# Patient Record
Sex: Female | Born: 1955
Health system: Southern US, Community
[De-identification: ages and names within clinical notes are randomized; demographics above are authoritative.]

## PROBLEM LIST (undated history)

## (undated) DIAGNOSIS — I82409 Acute embolism and thrombosis of unspecified deep veins of unspecified lower extremity: Secondary | ICD-10-CM

## (undated) DIAGNOSIS — Z9889 Other specified postprocedural states: Secondary | ICD-10-CM

## (undated) DIAGNOSIS — R519 Headache, unspecified: Secondary | ICD-10-CM

## (undated) DIAGNOSIS — K8050 Calculus of bile duct without cholangitis or cholecystitis without obstruction: Secondary | ICD-10-CM

## (undated) DIAGNOSIS — R5383 Other fatigue: Secondary | ICD-10-CM

## (undated) DIAGNOSIS — K449 Diaphragmatic hernia without obstruction or gangrene: Secondary | ICD-10-CM

## (undated) DIAGNOSIS — M1990 Unspecified osteoarthritis, unspecified site: Secondary | ICD-10-CM

## (undated) DIAGNOSIS — I803 Phlebitis and thrombophlebitis of lower extremities, unspecified: Secondary | ICD-10-CM

## (undated) DIAGNOSIS — K649 Unspecified hemorrhoids: Secondary | ICD-10-CM

## (undated) DIAGNOSIS — R112 Nausea with vomiting, unspecified: Secondary | ICD-10-CM

## (undated) DIAGNOSIS — M170 Bilateral primary osteoarthritis of knee: Secondary | ICD-10-CM

## (undated) DIAGNOSIS — R51 Headache: Secondary | ICD-10-CM

## (undated) DIAGNOSIS — A630 Anogenital (venereal) warts: Secondary | ICD-10-CM

## (undated) DIAGNOSIS — K602 Anal fissure, unspecified: Secondary | ICD-10-CM

## (undated) DIAGNOSIS — N393 Stress incontinence (female) (male): Secondary | ICD-10-CM

## (undated) DIAGNOSIS — G43909 Migraine, unspecified, not intractable, without status migrainosus: Secondary | ICD-10-CM

## (undated) DIAGNOSIS — K219 Gastro-esophageal reflux disease without esophagitis: Secondary | ICD-10-CM

## (undated) DIAGNOSIS — E669 Obesity, unspecified: Secondary | ICD-10-CM

## (undated) HISTORY — DX: Migraine, unspecified, not intractable, without status migrainosus: G43.909

## (undated) HISTORY — DX: Acute embolism and thrombosis of unspecified deep veins of unspecified lower extremity: I82.409

## (undated) HISTORY — DX: Obesity, unspecified: E66.9

## (undated) HISTORY — DX: Phlebitis and thrombophlebitis of lower extremities, unspecified: I80.3

## (undated) HISTORY — DX: Other fatigue: R53.83

## (undated) HISTORY — DX: Unspecified osteoarthritis, unspecified site: M19.90

## (undated) HISTORY — DX: Anal fissure, unspecified: K60.2

## (undated) HISTORY — DX: Headache, unspecified: R51.9

## (undated) HISTORY — DX: Stress incontinence (female) (male): N39.3

## (undated) HISTORY — DX: Gastro-esophageal reflux disease without esophagitis: K21.9

## (undated) HISTORY — DX: Calculus of bile duct without cholangitis or cholecystitis without obstruction: K80.50

## (undated) HISTORY — DX: Anogenital (venereal) warts: A63.0

## (undated) HISTORY — DX: Headache: R51

## (undated) HISTORY — DX: Unspecified hemorrhoids: K64.9

## (undated) HISTORY — DX: Wilson's disease: E83.01

## (undated) HISTORY — PX: ABDOMINAL HYSTERECTOMY: SHX81

## (undated) HISTORY — DX: Bilateral primary osteoarthritis of knee: M17.0

## (undated) HISTORY — DX: Diaphragmatic hernia without obstruction or gangrene: K44.9

---

## 1984-02-17 HISTORY — PX: ANAL FISSURECTOMY: SUR608

## 1994-06-17 DIAGNOSIS — K8050 Calculus of bile duct without cholangitis or cholecystitis without obstruction: Secondary | ICD-10-CM

## 1994-06-17 HISTORY — DX: Calculus of bile duct without cholangitis or cholecystitis without obstruction: K80.50

## 1995-02-17 HISTORY — PX: LAPAROSCOPIC CHOLECYSTECTOMY: SUR755

## 1998-07-09 ENCOUNTER — Encounter: Payer: Self-pay | Admitting: *Deleted

## 1998-07-09 ENCOUNTER — Ambulatory Visit (HOSPITAL_COMMUNITY): Admission: RE | Admit: 1998-07-09 | Discharge: 1998-07-09 | Payer: Self-pay | Admitting: *Deleted

## 1998-10-17 ENCOUNTER — Ambulatory Visit (HOSPITAL_COMMUNITY): Admission: RE | Admit: 1998-10-17 | Discharge: 1998-10-17 | Payer: Self-pay | Admitting: Family Medicine

## 1998-10-17 ENCOUNTER — Encounter: Payer: Self-pay | Admitting: Family Medicine

## 1998-10-18 ENCOUNTER — Other Ambulatory Visit: Admission: RE | Admit: 1998-10-18 | Discharge: 1998-10-18 | Payer: Self-pay | Admitting: *Deleted

## 1999-08-13 ENCOUNTER — Ambulatory Visit (HOSPITAL_COMMUNITY): Admission: RE | Admit: 1999-08-13 | Discharge: 1999-08-13 | Payer: Self-pay | Admitting: *Deleted

## 1999-08-13 ENCOUNTER — Encounter: Payer: Self-pay | Admitting: *Deleted

## 1999-12-29 ENCOUNTER — Other Ambulatory Visit: Admission: RE | Admit: 1999-12-29 | Discharge: 1999-12-29 | Payer: Self-pay | Admitting: Family Medicine

## 2001-06-30 ENCOUNTER — Other Ambulatory Visit: Admission: RE | Admit: 2001-06-30 | Discharge: 2001-06-30 | Payer: Self-pay | Admitting: *Deleted

## 2001-11-15 ENCOUNTER — Ambulatory Visit (HOSPITAL_COMMUNITY): Admission: RE | Admit: 2001-11-15 | Discharge: 2001-11-15 | Payer: Self-pay | Admitting: *Deleted

## 2001-11-15 ENCOUNTER — Encounter: Payer: Self-pay | Admitting: *Deleted

## 2002-06-15 ENCOUNTER — Encounter: Payer: Self-pay | Admitting: Family Medicine

## 2002-06-15 ENCOUNTER — Ambulatory Visit (HOSPITAL_COMMUNITY): Admission: RE | Admit: 2002-06-15 | Discharge: 2002-06-15 | Payer: Self-pay | Admitting: Family Medicine

## 2002-09-07 ENCOUNTER — Ambulatory Visit: Admission: RE | Admit: 2002-09-07 | Discharge: 2002-09-07 | Payer: Self-pay | Admitting: Family Medicine

## 2003-02-17 HISTORY — PX: BUNIONECTOMY: SHX129

## 2003-11-09 ENCOUNTER — Other Ambulatory Visit: Admission: RE | Admit: 2003-11-09 | Discharge: 2003-11-09 | Payer: Self-pay | Admitting: *Deleted

## 2003-11-16 ENCOUNTER — Ambulatory Visit (HOSPITAL_COMMUNITY): Admission: RE | Admit: 2003-11-16 | Discharge: 2003-11-16 | Payer: Self-pay | Admitting: *Deleted

## 2005-09-30 ENCOUNTER — Ambulatory Visit (HOSPITAL_COMMUNITY): Admission: RE | Admit: 2005-09-30 | Discharge: 2005-09-30 | Payer: Self-pay | Admitting: Obstetrics and Gynecology

## 2005-10-21 ENCOUNTER — Other Ambulatory Visit: Admission: RE | Admit: 2005-10-21 | Discharge: 2005-10-21 | Payer: Self-pay | Admitting: Obstetrics & Gynecology

## 2006-02-16 HISTORY — PX: ENDOVENOUS ABLATION SAPHENOUS VEIN W/ LASER: SUR449

## 2006-12-14 ENCOUNTER — Ambulatory Visit (HOSPITAL_COMMUNITY): Admission: RE | Admit: 2006-12-14 | Discharge: 2006-12-14 | Payer: Self-pay | Admitting: Family Medicine

## 2007-01-10 ENCOUNTER — Other Ambulatory Visit: Admission: RE | Admit: 2007-01-10 | Discharge: 2007-01-10 | Payer: Self-pay | Admitting: Obstetrics & Gynecology

## 2007-12-12 ENCOUNTER — Ambulatory Visit: Payer: Self-pay | Admitting: Family Medicine

## 2008-01-19 ENCOUNTER — Other Ambulatory Visit: Admission: RE | Admit: 2008-01-19 | Discharge: 2008-01-19 | Payer: Self-pay | Admitting: Obstetrics & Gynecology

## 2008-01-30 ENCOUNTER — Encounter: Admission: RE | Admit: 2008-01-30 | Discharge: 2008-01-30 | Payer: Self-pay | Admitting: Gastroenterology

## 2008-02-15 ENCOUNTER — Ambulatory Visit (HOSPITAL_COMMUNITY): Admission: RE | Admit: 2008-02-15 | Discharge: 2008-02-15 | Payer: Self-pay | Admitting: Obstetrics & Gynecology

## 2009-03-20 ENCOUNTER — Ambulatory Visit (HOSPITAL_COMMUNITY): Admission: RE | Admit: 2009-03-20 | Discharge: 2009-03-20 | Payer: Self-pay | Admitting: Obstetrics & Gynecology

## 2009-05-30 ENCOUNTER — Encounter: Admission: RE | Admit: 2009-05-30 | Discharge: 2009-05-30 | Payer: Self-pay | Admitting: General Surgery

## 2009-12-26 ENCOUNTER — Encounter
Admission: RE | Admit: 2009-12-26 | Discharge: 2009-12-26 | Payer: Self-pay | Source: Home / Self Care | Attending: General Surgery | Admitting: General Surgery

## 2010-03-12 ENCOUNTER — Ambulatory Visit (HOSPITAL_COMMUNITY): Admission: RE | Admit: 2010-03-12 | Payer: Self-pay | Source: Home / Self Care | Admitting: General Surgery

## 2010-03-19 ENCOUNTER — Encounter: Payer: Self-pay | Admitting: General Surgery

## 2010-04-09 ENCOUNTER — Other Ambulatory Visit: Payer: Self-pay | Admitting: Obstetrics & Gynecology

## 2010-04-09 DIAGNOSIS — Z1231 Encounter for screening mammogram for malignant neoplasm of breast: Secondary | ICD-10-CM

## 2010-04-15 ENCOUNTER — Ambulatory Visit (HOSPITAL_COMMUNITY)
Admission: RE | Admit: 2010-04-15 | Discharge: 2010-04-15 | Disposition: A | Discharge status: Home / Self Care | Payer: BC Managed Care – PPO | Source: Ambulatory Visit | Attending: Obstetrics & Gynecology | Admitting: Obstetrics & Gynecology

## 2010-04-15 DIAGNOSIS — Z1231 Encounter for screening mammogram for malignant neoplasm of breast: Secondary | ICD-10-CM

## 2010-06-17 ENCOUNTER — Other Ambulatory Visit (HOSPITAL_COMMUNITY): Payer: Self-pay | Admitting: General Surgery

## 2010-06-26 ENCOUNTER — Ambulatory Visit (HOSPITAL_COMMUNITY)
Admission: RE | Admit: 2010-06-26 | Discharge: 2010-06-26 | Disposition: A | Discharge status: Home / Self Care | Payer: 59 | Source: Ambulatory Visit | Attending: General Surgery | Admitting: General Surgery

## 2010-06-26 DIAGNOSIS — K219 Gastro-esophageal reflux disease without esophagitis: Secondary | ICD-10-CM | POA: Insufficient documentation

## 2010-06-26 DIAGNOSIS — R0989 Other specified symptoms and signs involving the circulatory and respiratory systems: Secondary | ICD-10-CM | POA: Insufficient documentation

## 2010-06-26 DIAGNOSIS — Z6839 Body mass index (BMI) 39.0-39.9, adult: Secondary | ICD-10-CM | POA: Insufficient documentation

## 2010-06-26 DIAGNOSIS — R0609 Other forms of dyspnea: Secondary | ICD-10-CM | POA: Insufficient documentation

## 2010-06-26 DIAGNOSIS — R030 Elevated blood-pressure reading, without diagnosis of hypertension: Secondary | ICD-10-CM | POA: Insufficient documentation

## 2010-06-26 DIAGNOSIS — K449 Diaphragmatic hernia without obstruction or gangrene: Secondary | ICD-10-CM | POA: Insufficient documentation

## 2010-06-30 ENCOUNTER — Ambulatory Visit (HOSPITAL_COMMUNITY)
Admission: RE | Admit: 2010-06-30 | Discharge: 2010-06-30 | Disposition: A | Discharge status: Home / Self Care | Payer: 59 | Source: Ambulatory Visit | Attending: General Surgery | Admitting: General Surgery

## 2010-06-30 DIAGNOSIS — Z01818 Encounter for other preprocedural examination: Secondary | ICD-10-CM | POA: Insufficient documentation

## 2010-07-10 ENCOUNTER — Ambulatory Visit (HOSPITAL_BASED_OUTPATIENT_CLINIC_OR_DEPARTMENT_OTHER): Payer: 59 | Attending: General Surgery

## 2010-07-10 DIAGNOSIS — G4710 Hypersomnia, unspecified: Secondary | ICD-10-CM | POA: Insufficient documentation

## 2010-07-10 DIAGNOSIS — G4730 Sleep apnea, unspecified: Secondary | ICD-10-CM | POA: Insufficient documentation

## 2010-07-13 DIAGNOSIS — G4730 Sleep apnea, unspecified: Secondary | ICD-10-CM

## 2010-07-13 DIAGNOSIS — R0989 Other specified symptoms and signs involving the circulatory and respiratory systems: Secondary | ICD-10-CM

## 2010-07-13 DIAGNOSIS — R0609 Other forms of dyspnea: Secondary | ICD-10-CM

## 2010-07-13 DIAGNOSIS — G4710 Hypersomnia, unspecified: Secondary | ICD-10-CM

## 2010-07-14 NOTE — Procedures (Signed)
NAME:  Kathryn Pineda, Kathryn Pineda NO.:  [id number]  MEDICAL RECORD NO.:  [id number]          PATIENT TYPE:  OUT  LOCATION:  SLEEP CENTER                 FACILITY:  [facility]  PHYSICIAN:  Clinton D. Maple Hudson, MD, FCCP, FACPDATE OF BIRTH:  Aug 27, 1957  DATE OF STUDY:  07/10/2010                           NOCTURNAL POLYSOMNOGRAM  REFERRING PHYSICIAN:  ERIC M WILSON  REFERRING PHYSICIAN:  Mary Sella. Andrey Campanile, MD  INDICATION FOR STUDY:  Hypersomnia with sleep apnea.  EPWORTH SLEEPINESS SCORE:  5/24.  BMI 39.  Weight 260 pounds, height 68.5 inches.  Neck 16 inches.  MEDICATIONS:  Home medications are charted and reviewed.  SLEEP ARCHITECTURE:  Total sleep time 271.5 minutes with sleep efficiency 63.3%.  Stage I was 11%, stage II 68.1%, stage III absent, REM 28.8% of total sleep time.  Sleep latency 89.5 minutes, REM latency 58 minutes, awake after sleep onset 70.5 minutes, arousal index 34.5.  Bedtime medication:  Tums.  RESPIRATORY DATA:  Apnea/hypopnea index (AHI) 3.1 per hour.  A total of 14 events was scored including 1 obstructive apnea, 1 mixed apnea, 12 hypopneas.  Events were not positional.  REM AHI 9.6 per hour.  An additional 77 respiratory effort-related arousals were counted contributing to a respiratory disturbance index (RDI) of 20.1 per hour. There were insufficient numbers of scored events to meet criteria for application of CPAP titration by split protocol on this study night.  OXYGEN DATA:  Moderately loud snoring with oxygen desaturation to a nadir of 83% and a mean oxygen saturation through the study of 94% on room air.  CARDIAC DATA:  Sinus rhythm with rare PAC.  MOVEMENT-PARASOMNIA:  No significant movement disturbance.  No bathroom trips.  IMPRESSIONS-RECOMMENDATIONS: 1. Sleep architecture was marked by late sleep onset after midnight,     frequent waking, nonspecific, between 1 and 3 a.m.  Consider     management for insomnia. 2. Occasional respiratory  event with sleep disturbance, within normal     limits, apnea/hypopnea index 3.1 per hour (normal range 0-5 per     hour).  Events were not positional and were more common in REM (REM     AHI 9.6 per hour).  Moderately loud snoring with oxygen     desaturation to a nadir of 83% and a mean saturation through the     study of 94% on room air.  A total of 0.2 minutes was recorded     through the study with oxygen saturation less than 88% on room air.     Clinton D. Maple Hudson, MD, [facility], FACP Diplomate, Biomedical engineer of Sleep Medicine Electronically Signed    CDY/MEDQ  D:  07/13/2010 14:49:50  T:  07/14/2010 95:18:84  Job:  [id number]

## 2010-08-28 ENCOUNTER — Encounter: Payer: 59 | Attending: General Surgery

## 2010-08-28 DIAGNOSIS — Z9884 Bariatric surgery status: Secondary | ICD-10-CM | POA: Insufficient documentation

## 2010-08-28 DIAGNOSIS — Z09 Encounter for follow-up examination after completed treatment for conditions other than malignant neoplasm: Secondary | ICD-10-CM | POA: Insufficient documentation

## 2010-08-28 DIAGNOSIS — Z713 Dietary counseling and surveillance: Secondary | ICD-10-CM | POA: Insufficient documentation

## 2010-08-28 NOTE — Progress Notes (Signed)
Pre-Operative Nutrition Class  Patient was seen on 08/28/10 for Pre-Operative Nutrition education at [facility].   Surgery date: 09/16/10 Surgery type: Lap-Band  Weight today: 271.5lbs Weight change: 8.9lb gain Total weight lost: N/A BMI: 41.3%  Samples given per MNT protocol: Bariatric Advantage Vitaband Lot #[id number] Exp: 9/13  Bariatric Advantage Calcium Citrate Lot # [id number] Exp:10/13  Celebrate Vitamins Multivitamin Lot # [id number] Exp: 9/13  Celebrate Vitamins Calcium Citrate  Lot # [id number] Exp: 4/13  The following the learning objective met by your patient during this course:   Identifies Pre-Op Dietary Goals and will begin 2 weeks pre-operatively   Identifies appropriate sources of fluids and proteins   States protein recommendations and appropriate sources pre and post-operatively  Identifies Post-Operative Dietary Goals and will follow for 2 weeks post-operatively  Identifies appropriate multivitamin and calcium sources post-operatively  Describes the need for physical activity post-operatively and will follow MD recommendations  States when to call healthcare provider regarding medication questions or post-operative complications  Follow-Up Plan: Patient will follow-up at [facility] 2 weeks post operatively for diet advancement per MD.

## 2010-09-08 ENCOUNTER — Encounter (INDEPENDENT_AMBULATORY_CARE_PROVIDER_SITE_OTHER): Payer: Self-pay | Admitting: General Surgery

## 2010-09-09 ENCOUNTER — Other Ambulatory Visit (INDEPENDENT_AMBULATORY_CARE_PROVIDER_SITE_OTHER): Payer: Self-pay | Admitting: General Surgery

## 2010-09-09 ENCOUNTER — Encounter (HOSPITAL_COMMUNITY): Payer: 59

## 2010-09-09 LAB — CBC
Hemoglobin: 13.9 g/dL (ref 12.0–15.0)
MCHC: 33.3 g/dL (ref 30.0–36.0)
MCV: 91.6 fL (ref 78.0–100.0)
RBC: 4.55 MIL/uL (ref 3.87–5.11)
WBC: 5.6 10*3/uL (ref 4.0–10.5)

## 2010-09-09 LAB — DIFFERENTIAL
Eosinophils Absolute: 0.1 10*3/uL (ref 0.0–0.7)
Eosinophils Relative: 2 % (ref 0–5)
Monocytes Relative: 7 % (ref 3–12)
Neutro Abs: 3.7 10*3/uL (ref 1.7–7.7)
Neutrophils Relative %: 65 % (ref 43–77)

## 2010-09-09 LAB — COMPREHENSIVE METABOLIC PANEL
Albumin: 4.5 g/dL (ref 3.5–5.2)
GFR calc Af Amer: >60 mL/min (ref >60)
Potassium: 4.3 mEq/L (ref 3.5–5.1)
Total Protein: 7.6 g/dL (ref 6.0–8.3)

## 2010-09-09 LAB — SURGICAL PCR SCREEN: Staphylococcus aureus: NEGATIVE

## 2010-09-10 ENCOUNTER — Encounter (INDEPENDENT_AMBULATORY_CARE_PROVIDER_SITE_OTHER): Payer: Self-pay | Admitting: General Surgery

## 2010-09-10 ENCOUNTER — Ambulatory Visit (INDEPENDENT_AMBULATORY_CARE_PROVIDER_SITE_OTHER): Payer: Commercial Managed Care - PPO | Admitting: General Surgery

## 2010-09-10 VITALS — BP 98/64 | HR 72 | Temp 96.7°F | Resp 12 | Ht 69.0 in | Wt 261.6 lb

## 2010-09-10 DIAGNOSIS — E669 Obesity, unspecified: Secondary | ICD-10-CM

## 2010-09-10 NOTE — Patient Instructions (Signed)
Continue with your pre-op LapBand diet

## 2010-09-11 NOTE — Progress Notes (Signed)
Kathryn Pineda is a 55 y.o. female.    Chief Complaint  Patient presents with  . Bariatric Pre-op    HPI HPI 55 year old obese Caucasian female who I initially saw on December 18, 2009 for consideration for weight loss surgery. She has done her preoperative evaluation and has been approved for weight loss surgery. She comes in today to discuss surgery. Her comorbidities include migraines, osteoarthritis of the lower extremity joints, and gastroesophageal reflux disease.  Past Medical History  Diagnosis Date  . Obesity   . Migraines   . Hemorrhoid   . GERD (gastroesophageal reflux disease)   . Phlebitis of leg   . Primary osteoarthritis of both knees   . SOB (shortness of breath)   . Fatigue     loss of sleep  . Breast pain   . Lump in female breast   . Hiatal hernia   . Arthritis   . Ear infection   . Sinus problem   . Rash     Past Surgical History  Procedure Date  . Anal fissurectomy 1986  . Laparoscopic cholecystectomy 1997  . Bunionectomy 2005  . Endovenous ablation saphenous vein w/ laser 2008    both lower legs  . Cesarean section 1990    Family History  Problem Relation Age of Onset  . Diabetes Father   . Osteoarthritis Mother   . Hypertension Brother     Social History History  Substance Use Topics  . Smoking status: Never Smoker   . Smokeless tobacco: Not on file  . Alcohol Use: Yes     "one drink once or twice a week"    Allergies  Allergen Reactions  . Penicillins Itching and Rash    All over body.    Current Outpatient Prescriptions  Medication Sig Dispense Refill  . CALCIUM-VITAMIN D PO Take by mouth as needed.        . Cyanocobalamin (B-12 PO) Take by mouth as needed.        Marland Kitchen guaiFENesin (MUCINEX) 600 MG 12 hr tablet Take 1,200 mg by mouth as needed.       . Loratadine (CLARITIN PO) Take by mouth as needed.        . Multiple Vitamin (MULTIVITAMIN) tablet Take 1 tablet by mouth as needed.        Marland Kitchen ibuprofen (ADVIL,MOTRIN) 800 MG tablet  Take 800 mg by mouth as needed.          Review of Systems Review of Systems  Constitutional: Negative for fever and chills.  Eyes: Negative for blurred vision.  Respiratory: Negative for shortness of breath.   Cardiovascular: Negative for chest pain, palpitations, orthopnea and PND.  Gastrointestinal: Negative for heartburn, nausea, vomiting, abdominal pain, constipation and blood in stool.  Genitourinary: Negative for dysuria and urgency.  Musculoskeletal: Positive for joint pain.  Skin: Negative for itching and rash.  Neurological: Negative for dizziness, seizures and headaches.  Endo/Heme/Allergies: Does not bruise/bleed easily.  Psychiatric/Behavioral: Negative.     Physical Exam Physical Exam  Vitals reviewed. Constitutional: She is oriented to person, place, and time. She appears well-developed and well-nourished. No distress.  HENT:  Head: Normocephalic and atraumatic.  Eyes: Conjunctivae are normal. No scleral icterus.  Neck: Normal range of motion. No tracheal deviation present.  Cardiovascular: Normal rate, regular rhythm and normal heart sounds.   Respiratory: Effort normal and breath sounds normal. No respiratory distress. She has no wheezes.  GI: Soft. Bowel sounds are normal. She exhibits no distension. There  is no tenderness. There is no guarding.       Well healed trocar incisions  Musculoskeletal: Normal range of motion.  Lymphadenopathy:    She has no cervical adenopathy.  Neurological: She is alert and oriented to person, place, and time.  Skin: Skin is warm and dry.  Psychiatric: She has a normal mood and affect. Her behavior is normal. Judgment and thought content normal.     Blood pressure 98/64, pulse 72, temperature 96.7 F (35.9 C), temperature source Temporal, resp. rate 12, height 5\' 9"  (1.753 m), weight 261 lb 9.6 oz (118.661 kg).  Data Reviewed:  UPPER GI SERIES WITH KUB  Technique: Routine upper GI series was performed with thin and  high  density barium.  Comparison: None.   Findings: The KUB reveals a normal stool and bowel gas pattern.  Surgical clips project over the gallbladder fossa. An IUD is seen  at the mid pelvis.   There is a small sliding type hiatal hernia. No reflux was seen  during the exam. Otherwise, the esophagus, stomach, duodenal bulb,  and remainder of the C-loop have a normal appearance with no  evidence of stricture, ulceration, fixed filling defect.   IMPRESSION:  There is a small sliding type hiatal hernia. The exam is otherwise  Negative.  CMET, CBC, T4, TSH are wnl from 07/08/10    Assessment/Plan 55 year old obese Caucasian female with a BMI of 38.7 who is scheduled for laparoscopic adjustable gastric band placement with possible hiatal hernia repair next week.  We reviewed her upper GI results as well as her preoperative labs.  We discussed laparoscopic adjustable gastric banding. The patient was given Agricultural engineer. We discussed the risk and benefits of surgery including but not limited to bleeding, infection, injury to surrounding structures, blood clot formation such as deep venous thrombosis or pulmonary embolism, need to convert to an open procedure, band slippage, band erosion, failure to loose weight, port complications, esophageal dilatation, worsening reflux, and vitamin deficiencies. We discussed the typical post operative recovery course. We discussed that her postoperative diet will be modified for several weeks. We specifically talked about the need to be on a liquid diet for one to 2 weeks after surgery. We also discussed the typical postoperative course with a laparoscopic adjustable gastric band and the need for frequent postoperative visits to assess the volume status of the band.  We discussed the typical expected weight loss with a laparoscopic adjustable gastric band. I explained to the patient that they can expect to lose 40-60% of their excess body weight if they are  compliant with their postoperative instructions. However I did explain that some patients loose less than 40% and some patients lose more than 60% of their excess body weight.   Gaynelle Adu M 09/11/2010, 12:54 PM

## 2010-09-16 ENCOUNTER — Ambulatory Visit (HOSPITAL_COMMUNITY): Payer: 59

## 2010-09-16 ENCOUNTER — Ambulatory Visit (HOSPITAL_COMMUNITY)
Admission: RE | Admit: 2010-09-16 | Discharge: 2010-09-16 | Disposition: A | Discharge status: Home / Self Care | Payer: 59 | Source: Ambulatory Visit | Attending: General Surgery | Admitting: General Surgery

## 2010-09-16 DIAGNOSIS — K219 Gastro-esophageal reflux disease without esophagitis: Secondary | ICD-10-CM | POA: Insufficient documentation

## 2010-09-16 DIAGNOSIS — M1990 Unspecified osteoarthritis, unspecified site: Secondary | ICD-10-CM | POA: Insufficient documentation

## 2010-09-16 DIAGNOSIS — K210 Gastro-esophageal reflux disease with esophagitis: Secondary | ICD-10-CM

## 2010-09-16 DIAGNOSIS — Z9089 Acquired absence of other organs: Secondary | ICD-10-CM | POA: Insufficient documentation

## 2010-09-16 DIAGNOSIS — K449 Diaphragmatic hernia without obstruction or gangrene: Secondary | ICD-10-CM

## 2010-09-16 DIAGNOSIS — Z6838 Body mass index (BMI) 38.0-38.9, adult: Secondary | ICD-10-CM | POA: Insufficient documentation

## 2010-09-16 DIAGNOSIS — G43909 Migraine, unspecified, not intractable, without status migrainosus: Secondary | ICD-10-CM | POA: Insufficient documentation

## 2010-09-16 DIAGNOSIS — Z01812 Encounter for preprocedural laboratory examination: Secondary | ICD-10-CM | POA: Insufficient documentation

## 2010-09-16 HISTORY — PX: LAPAROSCOPIC GASTRIC SLEEVE RESECTION WITH HIATAL HERNIA REPAIR: SHX6512

## 2010-09-23 ENCOUNTER — Encounter: Payer: 59 | Attending: General Surgery

## 2010-09-23 ENCOUNTER — Ambulatory Visit: Payer: 59 | Admitting: *Deleted

## 2010-09-23 DIAGNOSIS — Z09 Encounter for follow-up examination after completed treatment for conditions other than malignant neoplasm: Secondary | ICD-10-CM | POA: Insufficient documentation

## 2010-09-23 DIAGNOSIS — Z713 Dietary counseling and surveillance: Secondary | ICD-10-CM | POA: Insufficient documentation

## 2010-09-23 DIAGNOSIS — Z9884 Bariatric surgery status: Secondary | ICD-10-CM | POA: Insufficient documentation

## 2010-09-23 NOTE — Progress Notes (Signed)
  2 Week Post-Operative Nutrition Class  Patient was seen on 09/23/2010 for Post-Operative Nutrition education at the [facility].   Surgery date: 09/16/10 Surgery type: LAGB  Weight today: 249.6lbs  Weight change: 21.9 lbs BMI: 37.9%  The following the learning objective met the patient during this course:   Identifies Phase 3A (Soft, High Proteins) Dietary Goals and will begin from 2 weeks post-operatively to 2 months post-operatively   Identifies appropriate sources of fluids and proteins   States protein recommendations and appropriate sources post-operatively  Identifies the need for appropriate texture modifications, mastication, and bite sizes when consuming solids  Identifies appropriate multivitamin and calcium sources post-operatively  Describes the need for physical activity post-operatively and will follow MD recommendations  States when to call healthcare provider regarding medication questions or post-operative complications  Follow-Up Plan: Patient will follow-up at [facility] in 6 weeks for 2 months post-op nutrition visit for diet advancement per MD.

## 2010-09-23 NOTE — Op Note (Signed)
NAMECHELSIE, Kathryn Pineda NO.:  [id number]  MEDICAL RECORD NO.:  [id number]  LOCATION:  DAY[facility]                         FACILITY:  [facility]  PHYSICIAN:  Mary Sella. Andrey Campanile, MD     DATE OF BIRTH:  [birth year]  DATE OF PROCEDURE:  09/16/2010 DATE OF DISCHARGE:                              OPERATIVE REPORT   PREOPERATIVE DIAGNOSES: 1. Obesity (body mass index of 38.7). 2. Gastroesophageal reflux disease. 3. Osteoarthritis.  POSTOPERATIVE DIAGNOSES: 1. Obesity (body mass index of 38.7). 2. Gastroesophageal reflux disease. 3. Osteoarthritis.  PROCEDURES: 1. Primary hiatal hernia repair. 2. Laparoscopic adjustable gastric band placement.  SURGEON:  Mary Sella. Andrey Campanile, M.D.  ASSISTANT SURGEON:  Sandria Bales. Ezzard Standing, M.D.  ANESTHESIA:  General plus local consisting of 0.25% Marcaine with epi.  FINDINGS:  The patient had an obvious hiatal hernia upon looking at her diaphragm.  Once we identified the left and right crus, I repaired the crural defect with 2 interrupted 0 Ethibond sutures using an Endostitch.  An AP Standard band was placed in the pars flaccida technique.  ESTIMATED BLOOD LOSS:  Minimal.  COMPLICATIONS:  None immediately apparent.  INDICATIONS FOR PROCEDURE:  Patient is a 55 year old obese Caucasian female who has been unsuccessful for long-term successful weight loss. Her comorbidities include gastroesophageal reflux disease, migraines, and osteoarthritis.  We discussed the risks and benefits of laparoscopic adjustable gastric banding with possible hiatal hernia repair at length including but not limited to bleeding, infection, injury to surrounding structures, band erosion, band slippage, DVT and pulmonary embolism formation, wound complications, port complications, failure to lose weight, worsening gastroesophageal reflux disease, esophageal dilatation, and recurrence of hiatal hernia.  We also talked about the typical postoperative recovery course.  The  patient elected proceed with surgery.  DESCRIPTION OF PROCEDURE:  The patient received subcutaneous heparin within 2 hours of surgery.  She was then taken to the operating room and placed supine on the operating table.  General endotracheal anesthesia was established.  Sequential compression devices were placed.  She received IV Tylenol as well as antibiotics prior to skin incision. Surgical time-out was performed.  I gained entry into her abdomen in the left upper quadrant 2 fingerbreadths below the left subcostal margin by 1-cm incision with #11 blade.  Then, using a 0-degree 5-mm camera through a 5-mm trocar, I advanced the trocar under direct visualization through all layers of the abdominal wall and I carefully entered her abdominal cavity. Pneumoperitoneum was smoothly established up to a patient pressure of 15 mmHg.  There was no scar tissue from her prior laparoscopic cholecystectomy.  I then placed additional trocars.  I placed an 11 mm trocar at the level of the umbilicus slightly to the left.  I placed a 15-mm trocar in the right upper quadrant 2 fingerbreadths below the right subcostal margin and I placed an 11-mm trocar in the right mid upper abdomen, all under direct visualization after local been infiltrated.  The patient was placed in steep reverse Trendelenburg.  I made a small incision underneath the xiphoid and placed a 5-mm trocar under direct visualization.  The trocar were removed and I placed Nathanson liver retractor to lift up the  left lobe of liver and was secured to Iron Intern.  There was obvious diaphragmatic defect when looking at the hiatus.  I first turned my attention to incising the gastrohepatic ligament with hook electrocautery.  The right crus was identified.  Then, using a Kitner as well as a blunt grasper, I identified the left crus.  I initially dissected a little bit on the outside of left crus getting into a little bit of bleeding.   The bleeding was controlled with several Ligaclips.  There was excellent hemostasis.  I identified the posterior vagus.  The left and right crus was identified.  I then asked CRNA to pass the calibration tubing down the esophagus into the stomach.  It was insufflated with 10 cc of air, it was then easily withdrawn back into the esophagus confirming the defect.  The balloon was let down, withdrawn back into the esophagus.  I placed 2 interrupted 0 Ethibond sutures between the left and right crus, each secured with a titanium tie knot.  We then passed the calibration tubing back down the stomach, insufflated with 10 cc.  The calibration tubing was then gently pulled back and it had adequate resistance at the GE junction.  The balloon was deflated & the calibration tubing was pulled back into the esophagus.  I then incised the angle of His with hook electrocautery.  Then using the band dissector, I gently dissected down the left crus at the angle of His.  I then passed the band passer retrogastric in the pars flaccida technique toward the angle of His.  I determined that we would need an AP Standard band, band was prepared according to package traction and then inserted through the right upper quadrant 15-mm trocar.  The end of the band was threaded through the passer and then brought back behind the stomach. The calibration tubing was then advanced back into the stomach and the band was secured to the buckle with the black tab locking into position. The calibration tubing was removed.  I then placed 3 gastrogastric imbrication sutures with interrupted 2-0 Ethibond sutures.  The port tubing was brought up through the right mid upper abdomen 11 mm trocar site.  The [facility] liver retractor was removed under direct visualization.  There was no evidence of injury to the left lobe of liver or the abdominal wall.  Pneumoperitoneum was released, remaining trocars were removed.  The skin incision  for the 11-mm trocar in the right midabdomen was enlarged and I created a small pocket underneath the skin lateral to the incision.  The end of the band tubing was cut off and the band tubing was connected to the port.  1 inch piece of Vicryl mesh had been sutured to the back of the port in 4 areas.  The band tubing and the port were connected.  The redundant port tubing was advanced back into the abdominal cavity and the port was placed in the subcutaneous tissue in the right midabdomen.  I then closed that wound in 2 layers, initially with interrupted 2-0 Vicryl and the skin incision with a 4-0 Monocryl.  Additional Monocryl was placed in the skin incisions.  Remaining skin incisions were closed with 4-0 Monocryl followed by application of Dermabond.  All needle, instrument, and sponge counts were correct x2.  There were no immediate complications.  The patient tolerated the procedure well.  She was extubated and taken to the recovery room.     Mary Sella. Andrey Campanile, MD  EMW/MEDQ  D:  09/16/2010  T:  09/16/2010  Job:  [id number]  cc:   M. Leda Quail, MD Fax: [phone]  Electronically Signed by Gaynelle Adu M.D. on 09/23/2010 10:46:20 AM

## 2010-10-08 ENCOUNTER — Ambulatory Visit (INDEPENDENT_AMBULATORY_CARE_PROVIDER_SITE_OTHER): Payer: Commercial Managed Care - PPO | Admitting: General Surgery

## 2010-10-08 ENCOUNTER — Encounter (INDEPENDENT_AMBULATORY_CARE_PROVIDER_SITE_OTHER): Payer: Self-pay | Admitting: General Surgery

## 2010-10-08 VITALS — BP 110/70 | Ht 69.0 in | Wt 244.4 lb

## 2010-10-08 DIAGNOSIS — Z9884 Bariatric surgery status: Secondary | ICD-10-CM

## 2010-10-08 DIAGNOSIS — E669 Obesity, unspecified: Secondary | ICD-10-CM

## 2010-10-08 DIAGNOSIS — Z09 Encounter for follow-up examination after completed treatment for conditions other than malignant neoplasm: Secondary | ICD-10-CM

## 2010-10-08 NOTE — Patient Instructions (Signed)
Continue taking supplements. Exercise for at least 4 days a week

## 2010-10-08 NOTE — Progress Notes (Signed)
Procedure: Status post laparoscopic adjustable gastric band placement with hiatal hernia repair on September 16, 2010  History of Present Ilness: 55 year old obese Caucasian female comes in today for her first postoperative appointment. She has returned to work. She's had some problems with constipation since surgery. She had some initial constipation within the first several days but then it resolved. She recently had another episode of constipation over the weekend. This caused her hemorrhoids to flareup. She took a teaspoon of Metamucil with some water which helped her to have a bowel movement. She also took some milk of magnesia for the episode immediately after surgery.  She denies any fevers, chills, nausea, or vomiting. She denies any regurgitation. She still has some soreness in her right mid abdomen. She has seen the nutritionist. She has been advanced to solid food. She has had no problems eating minced chicken. He  Physical Exam: BP 110/70  Ht 5\' 9"  (1.753 m)  Wt 244 lb 6 oz (110.848 kg)  BMI 36.09 kg/m2  Is well-developed well-nourished obese Caucasian female no apparent distress Pulmonary-lungs are clear to auscultation Cardiac-regular rate and rhythm Abdomen-soft, nontender, nondistended. Well-healed trocar incisions. Port is in the right upper quadrant. There is some bruising around the port site. No hernia. No cellulitis.  Data reviewed: I reviewed the nutritionist note from August 7.  Assessment and Plan: Status post laparoscopic adjustable gastric band placement with hiatal hernia repair-doing well. She has lost 16.4 pounds since surgery.   I congratulated her on her weight loss. I encouraged her to try to exercise at least 4 days a week. I encouraged her to exercise for at least 30 minutes each time. I also encouraged her to take a small teaspoon of Metamucil on a daily basis to help keep her regular. I advised her that the bruising will resolve. She was instructed to continue  with her supplement intake.  I'll see her in 3 weeks in order to do her first adjustment.

## 2010-10-30 ENCOUNTER — Ambulatory Visit (INDEPENDENT_AMBULATORY_CARE_PROVIDER_SITE_OTHER): Payer: Commercial Managed Care - PPO | Admitting: General Surgery

## 2010-10-30 ENCOUNTER — Encounter (INDEPENDENT_AMBULATORY_CARE_PROVIDER_SITE_OTHER): Payer: Self-pay | Admitting: General Surgery

## 2010-10-30 VITALS — BP 120/80 | Wt 244.4 lb

## 2010-10-30 DIAGNOSIS — Z09 Encounter for follow-up examination after completed treatment for conditions other than malignant neoplasm: Secondary | ICD-10-CM

## 2010-10-30 DIAGNOSIS — Z9884 Bariatric surgery status: Secondary | ICD-10-CM

## 2010-10-30 MED ORDER — LIDOCAINE HCL 2 % EX GEL: CUTANEOUS | Status: DC

## 2010-10-30 NOTE — Progress Notes (Signed)
Procedure: Status post laparoscopic adjustable gastric band placement with hiatal hernia repair on September 16, 2010  History of Present Ilness: 55 year old obese Caucasian female comes in today for her second postoperative appointment. She has returned to work. She's had some problems with constipation since surgery. Her constipation has improved since last visit.  She has noticed an improvement when takes some metamucil.   She denies any fevers, chills, nausea, or vomiting. She denies any regurgitation. She has had one episode of where some chicken got stuck. It eventually went down. She thinks that she did not chew it thoroughly enough. She states that she really hasn't exercised that much. She does not exercise on the days that she works. On her off days she will walk.  She says her reflux has resolved since surgery.  Physical Exam: BP 120/80  Wt 244 lb 6 oz (110.848 kg)   Is well-developed well-nourished obese Caucasian female no apparent distress Pulmonary-lungs are clear to auscultation Cardiac-regular rate and rhythm Abdomen-soft, nontender, nondistended. Well-healed trocar incisions. Port is in the right upper quadrant.  No hernia. No cellulitis.  Data reviewed: I reviewed the nutritionist note from August 7 and my last office note  Assessment and Plan: Status post laparoscopic adjustable gastric band placement with hiatal hernia repair-doing well. She has lost 16.4 pounds since surgery. She has not lost any additional weight since her last visit.   I encouraged her to try to exercise at least 4 days a week. I encouraged her to exercise for at least 30 minutes each time. I also encouraged to continue with Metamucil on a daily basis to help keep her regular.  We did her first adjustment today. Her abdominal wall was prepped with ChloraPrep. I then aspirated her port with a 22-gauge Huber needle and injected 1 cc of saline. The patient tolerated the procedure well. She was able to drink  sips of water without problems.  She was instructed to do a liquid diet for the next 48 hours. She was given our band fill adjustment instruction sheet  I will give her a prescription for topical lidocaine to apply to her abdominal wall prior to her office visits for future band adjustments.  We will refer her to the [facility] program.

## 2010-10-30 NOTE — Patient Instructions (Signed)

## 2010-11-03 ENCOUNTER — Encounter: Payer: Self-pay | Admitting: *Deleted

## 2010-11-03 ENCOUNTER — Encounter: Payer: 59 | Attending: General Surgery | Admitting: *Deleted

## 2010-11-03 DIAGNOSIS — Z9884 Bariatric surgery status: Secondary | ICD-10-CM | POA: Insufficient documentation

## 2010-11-03 DIAGNOSIS — Z713 Dietary counseling and surveillance: Secondary | ICD-10-CM | POA: Insufficient documentation

## 2010-11-03 DIAGNOSIS — Z09 Encounter for follow-up examination after completed treatment for conditions other than malignant neoplasm: Secondary | ICD-10-CM | POA: Insufficient documentation

## 2010-11-03 NOTE — Progress Notes (Signed)
  Follow-up visit: 8 Weeks Post-Operative LAGB Surgery  Medical Nutrition Therapy:  Appt start time: 1040 end time:  1100.  Assessment:  Primary concerns today: post-operative LAGB dietary intake.  Weight today: 240.7 lbs Weight change: 8.9 lb Total weight lost: 30.8 total BMI: 36.7% Weight goal: 175 lbs  24-hr recall:  B (7-9 AM): Scrambled egg OR Premier protein shake OR 1/2 cup oatmeal  L (12-1 PM): Premier Protein Shake (30g protein) OR Meat w/ vegetable (3-5 oz total) Snk (5 PM):  Premier Protein Shake (30g protein)  D (7-9 PM): Meat w/ vegetable (3-5 oz to 2 cups of food) Snk (10 PM): Sugar-free pudding or Yogurt cup  Fluid intake: water, diet green tea, Premier Protein Shake Estimated total protein intake: 75g +  Medications: No changes Supplementation: Taking MVI, Calcium Citrate (1000mg )  Using straws: No Drinking while eating: No Hair loss: None reported Carbonated beverages: No N/V/D/C: Only one episode of "food getting stuck" Last Lap-Band fill: Last fill was 10/30/10 per Dr. Andrey Campanile  Recent physical activity:  Limited activity at this point due to scheduling difficulties. Trying to walk on her days off. Plans to start back to the [facility].  Progress Towards Goal(s):  In progress.  Handouts given during visit include:  Phase 3B: High Protein with Non-Starchy Vegetable Diet   Nutritional Diagnosis:  [place]-3.3 Overweight/obesity As related to recent LAGB surgery.  As evidenced by pt following LAGB dietary guidelines for continued weight loss.    Intervention:  Nutrition education.  Monitoring/Evaluation:  Dietary intake, exercise, lap band fills, and body weight. Follow up in 6 months for 3-4 month post-op visit.

## 2010-11-03 NOTE — Patient Instructions (Signed)
Goals:  Follow Phase 3B: High Protein + Non-Starchy Vegetables  Eat 3-6 small meals/snacks, every 3-5 hrs  Increase lean protein foods to meet 60-90g goal  Increase fluid intake to 64oz +  Avoid drinking 15 minutes before, during and 30 minutes after eating  Aim for >30 min of physical activity daily 

## 2010-11-26 ENCOUNTER — Ambulatory Visit (INDEPENDENT_AMBULATORY_CARE_PROVIDER_SITE_OTHER): Payer: 59 | Admitting: General Surgery

## 2010-11-26 ENCOUNTER — Encounter (INDEPENDENT_AMBULATORY_CARE_PROVIDER_SITE_OTHER): Payer: Self-pay | Admitting: General Surgery

## 2010-11-26 VITALS — BP 108/84 | HR 60 | Temp 97.9°F | Resp 20 | Ht 68.5 in | Wt 243.0 lb

## 2010-11-26 DIAGNOSIS — Z9884 Bariatric surgery status: Secondary | ICD-10-CM

## 2010-11-26 NOTE — Progress Notes (Signed)
Procedure: Status post laparoscopic adjustable gastric band placement with hiatal hernia repair on September 16, 2010  History of Present Ilness: 55 year old obese Caucasian female comes in today for her third postoperative appointment. She has returned to work.  Her constipation has improved since last visit.  She has noticed an improvement when takes some metamucil.   She denies any fevers, chills, nausea, or vomiting. She denies any regurgitation.  She states that she really hasn't exercised that much. She does not exercise on the days that she works. On her off days she will walk. She went to the BELT program & is enrolled in the control arm of the study which means she will be able to participate in January.  She states she actually lost 4 pounds since she was last seen but she went to a wedding & made bad food choices. She has no restriction.  She says her reflux has resolved since surgery.  Physical Exam: BP 108/84  Pulse 60  Temp 97.9 F (36.6 C)  Resp 20  Ht 5' 8.5" (1.74 m)  Wt 243 lb (110.224 kg)  BMI 36.41 kg/m2   Is well-developed well-nourished obese Caucasian female no apparent distress Pulmonary-lungs are clear to auscultation Cardiac-regular rate and rhythm Abdomen-soft, nontender, nondistended. Well-healed trocar incisions. Port is in the right upper quadrant.  No hernia. No cellulitis.  Data reviewed: I reviewed my last office note  Assessment and Plan: Status post laparoscopic adjustable gastric band placement with hiatal hernia repair-doing well. She has lost 17.4 pounds since surgery. She has lost 1 pound since her last visit.   I encouraged her to try to exercise at least 4 days a week. I encouraged her to exercise for at least 30 minutes each time. I also encouraged to continue with Metamucil on a daily basis to help keep her regular.  We did her second adjustment today. Her abdominal wall was prepped with ChloraPrep. I then aspirated her port with a 22-gauge Huber  needle and injected 0.5 cc of saline. The patient tolerated the procedure well. She was able to drink sips of water without problems.  She was instructed to do a liquid diet for the next 48 hours. She was given our band fill adjustment instruction sheet  F/u 4 weeks

## 2010-11-26 NOTE — Patient Instructions (Signed)

## 2010-12-03 ENCOUNTER — Encounter (INDEPENDENT_AMBULATORY_CARE_PROVIDER_SITE_OTHER): Payer: Commercial Managed Care - PPO | Admitting: General Surgery

## 2010-12-15 ENCOUNTER — Ambulatory Visit: Payer: Self-pay | Admitting: *Deleted

## 2010-12-16 ENCOUNTER — Encounter: Payer: 59 | Attending: General Surgery | Admitting: *Deleted

## 2010-12-16 ENCOUNTER — Encounter: Payer: Self-pay | Admitting: *Deleted

## 2010-12-16 DIAGNOSIS — Z9884 Bariatric surgery status: Secondary | ICD-10-CM | POA: Insufficient documentation

## 2010-12-16 DIAGNOSIS — Z713 Dietary counseling and surveillance: Secondary | ICD-10-CM | POA: Insufficient documentation

## 2010-12-16 DIAGNOSIS — Z09 Encounter for follow-up examination after completed treatment for conditions other than malignant neoplasm: Secondary | ICD-10-CM | POA: Insufficient documentation

## 2010-12-16 NOTE — Patient Instructions (Addendum)
Goals:  Follow Phase 3B: High Protein + Non-Starchy Vegetables  OR Pre-Op Diet Meal Plan  Eat 3-6 small meals/snacks, every 3-5 hrs  Increase lean protein foods to meet 60-80g goal  Increase fluid intake to 64oz +  Avoid concentrated sweets  Limit bread, pasta, and rice portion sizes  Avoid drinking 15 minutes before, during and 30 minutes after eating  Aim for >30 min of physical activity daily

## 2010-12-16 NOTE — Progress Notes (Signed)
  Follow-up visit: 12 Weeks Post-Operative LAGB Surgery  Medical Nutrition Therapy:  Appt start time: 0835 end time:  0910   Assessment:  Primary concerns today: post-operative bariatric surgery nutrition management. Kathryn Pineda reports that over the past months she has not been following her LAGB guidelines. She notes large portions, increased amounts of sweets and carbohydrate rich foods, limited protein/fluid intake, and inadequate physical activity levels. She attributes this to increased responsibilities at home and busy schedule. [name] notes that she may need another band fill.  Weight today: 238.2 lbs Weight change: 2.5 lbs Total weight lost: 33.3 lbs BMI: 36.3% Weight goal: 175 lbs Surgery date: 09/16/10 Start weight at [facility]: 262.6 lbs  24-hr recall:  B (AM): Premier Protein Shake Snk (AM): N/A L (PM): Meat w/ vegetables Snk (PM): Halloween Candy OR SF pudding  D (PM): Tomato soup w/ two grilled cheese sandwiches Snk (PM): Cake OR Candy  Fluid intake: 20-30 oz Estimated total protein intake: 40-50 grams (Pt stopped tracking her intake over the past month)  Medications: No changes reported Supplementation: Taking supplements 50% of the time  Using straws: No Drinking while eating: No Hair loss: None reported Carbonated beverages: None N/V/D/C: Regurgitation with dry meats Last Lap-Band fill: 0.5cc fill per Dr. Andrey Campanile; 2 fills at this point  Recent physical activity:  No physical activity reported  Progress Towards Goal(s):  Some progress.  Handouts given during visit include:  Pre-Op Diet  Protein Bar Guidelines (<15g carb, 10g protein +)   Nutritional Diagnosis:  [place]-3.3 Overweight/obesity As related to recent LAGB surgery.  As evidenced by pt following LAGB dietary guidelines for continued weight loss.    Intervention:  Nutrition education.  Monitoring/Evaluation:  Dietary intake, exercise, lap band fills, and body weight. Follow up in 3 months for 6 month post-op  visit.

## 2010-12-24 ENCOUNTER — Encounter (INDEPENDENT_AMBULATORY_CARE_PROVIDER_SITE_OTHER): Payer: Self-pay | Admitting: General Surgery

## 2010-12-24 ENCOUNTER — Ambulatory Visit (INDEPENDENT_AMBULATORY_CARE_PROVIDER_SITE_OTHER): Payer: Commercial Managed Care - PPO | Admitting: General Surgery

## 2010-12-24 DIAGNOSIS — Z4651 Encounter for fitting and adjustment of gastric lap band: Secondary | ICD-10-CM

## 2010-12-24 DIAGNOSIS — E669 Obesity, unspecified: Secondary | ICD-10-CM

## 2010-12-24 DIAGNOSIS — Z9884 Bariatric surgery status: Secondary | ICD-10-CM

## 2010-12-24 NOTE — Progress Notes (Signed)
Procedure: Status post laparoscopic adjustable gastric band placement with hiatal hernia repair on September 16, 2010  History of Present Ilness: 55 year old obese Caucasian female comes in today for her fourth postoperative appointment.  She states she is doing well. She had a normal colonoscopy last week.  She attributes that to helping her loose weight.  Since her last adjustment, she is eating a little less.   She denies any fevers, chills, nausea, or vomiting. She denies any regurgitation.  She states that she still hasn't exercised that much. She does not exercise on the days that she works. On her off days she will walk.    Physical Exam: BP 124/72  Pulse 80  Resp 16  Ht 5\' 9"  (1.753 m)  Wt 238 lb 6 oz (108.126 kg)  BMI 35.20 kg/m2   Is well-developed well-nourished obese Caucasian female no apparent distress Pulmonary-lungs are clear to auscultation Cardiac-regular rate and rhythm Abdomen-soft, nontender, nondistended. Well-healed trocar incisions. Port is in the right upper quadrant.  No hernia. No cellulitis.  Data reviewed: I reviewed my last office note  Assessment and Plan: Status post laparoscopic adjustable gastric band placement with hiatal hernia repair-doing well. She has lost 21.8 pounds since surgery. She has lost 4.4 pounds since her last visit.   I encouraged her to try to exercise at least 4 days a week. I encouraged her to exercise for at least 30 minutes each time. I also encouraged to continue with Metamucil on a daily basis to help keep her regular.  We did her second adjustment today. Her abdominal wall was prepped with ChloraPrep. I then aspirated her port with a 22-gauge Huber needle and injected 1 cc of saline. The patient tolerated the procedure well. She was able to drink sips of water without problems.  She was instructed to do a liquid diet for the next 48 hours. She was given our band fill adjustment instruction sheet  F/u 4 weeks

## 2010-12-24 NOTE — Patient Instructions (Signed)

## 2010-12-31 ENCOUNTER — Encounter (INDEPENDENT_AMBULATORY_CARE_PROVIDER_SITE_OTHER): Payer: Commercial Managed Care - PPO | Admitting: General Surgery

## 2011-01-22 ENCOUNTER — Encounter (INDEPENDENT_AMBULATORY_CARE_PROVIDER_SITE_OTHER): Payer: Self-pay | Admitting: General Surgery

## 2011-01-22 ENCOUNTER — Ambulatory Visit (INDEPENDENT_AMBULATORY_CARE_PROVIDER_SITE_OTHER): Payer: Commercial Managed Care - PPO | Admitting: General Surgery

## 2011-01-22 DIAGNOSIS — E669 Obesity, unspecified: Secondary | ICD-10-CM

## 2011-01-22 DIAGNOSIS — Z9884 Bariatric surgery status: Secondary | ICD-10-CM

## 2011-01-22 NOTE — Progress Notes (Signed)
Procedure: Status post laparoscopic adjustable gastric band placement with hiatal hernia repair on September 16, 2010  History of Present Ilness: 55 year old obese Caucasian female comes in today for her fifth postoperative appointment. I last saw her on Nov 7.  She states she is doing well.  She states she can definitely tell a difference since her last adjustment.  She now has restriction.  She is now eating smaller portions.  It is taking her longer to eat.  She had problems with 'dry' food.  She now starts her meals with soft foods.  Right after her last adjustment, she had a period of about 1 week when she had a lot of nausea with po intake.  She did not regurgitate. She denies reflux or morning cough. She had a skin reaction to the bandage we placed on her abdominal wall after her last adjustment  She denies any fevers, chills, nausea, or vomiting. She denies any regurgitation.  She states that she still hasn't exercised that much. She does not exercise on the days that she works. On her off days she will walk.  She is starting the BELT program next month.  Physical Exam: BP 116/86  Pulse 60  Temp(Src) 97.6 F (36.4 C) (Temporal)  Resp 16  Ht 5\' 9"  (1.753 m)  Wt 236 lb 6 oz (107.219 kg)  BMI 34.91 kg/m2    2 pound wt loss since last visit 4 weeks ago  BP 116/86  Pulse 60  Temp(Src) 97.6 F (36.4 C) (Temporal)  Resp 16  Ht 5\' 9"  (1.753 m)  Wt 236 lb 6 oz (107.219 kg)  BMI 34.91 kg/m2  See LapBand flowsheet  Gen: alert, NAD, non-toxic appearing HEENT: normocephalic, atraumatic; pupils equal, no scleral icterus, neck supple, no lymphadenopathy Pulm: Lungs clear to auscultation, symmetric chest rise CV: regular rate and rhythm Abd: soft, nontender, nondistended. Well-healed trocar sites. No incisional hernia. Port is in right mid-abdomen. Healing areas around port site from adhesive reaction to bandage-no open wounds. Ext: no edema, normal, symmetric strength Neuro: nonfocal,  sensation grossly intact Psych: appropriate, judgment normal  Data reviewed: I reviewed my last office note  Assessment and Plan: Status post laparoscopic adjustable gastric band placement with hiatal hernia repair-doing well. She has lost 24.4 pounds since surgery. She has lost 2 pounds since her last visit.   I encouraged her to try to exercise at least 4 days a week. I encouraged her to exercise for at least 30 minutes each time. I also encouraged to continue with Metamucil on a daily basis to help keep her regular.  No adjustment today.   F/u 4 weeks  Mary Sella. Andrey Campanile, MD, FACS General, Bariatric, & Minimally Invasive Surgery [facility], [place]

## 2011-01-22 NOTE — Patient Instructions (Signed)
..   Decreasing your carbohydrate intake will hasten you weight loss.  Rely more on proteins for your meals.  Avoid condiments that contain sweets such as Honey Mustard and sugary salad dressings.   . Stay in the "green zone".  If you are regurgitating with meals, having night time reflux, and find yourself eating soft comfort foods (mashed potatoes, potato chips)...realize that you are developing "maladaptive eating".  You will not lose weight this way and may regain weight.  The GREEN ZONE is eating smaller portions and not regurgitating.  Hence we may need to withdraw fluid from your band. . Build exercise into your daily routine.  Walking is the best way to start but do something every day if you can.

## 2011-02-23 ENCOUNTER — Encounter (INDEPENDENT_AMBULATORY_CARE_PROVIDER_SITE_OTHER): Payer: Self-pay | Admitting: General Surgery

## 2011-02-23 ENCOUNTER — Ambulatory Visit (INDEPENDENT_AMBULATORY_CARE_PROVIDER_SITE_OTHER): Payer: Commercial Managed Care - PPO | Admitting: General Surgery

## 2011-02-23 VITALS — BP 116/78 | HR 70 | Temp 97.8°F | Resp 16 | Ht 69.0 in | Wt 239.4 lb

## 2011-02-23 DIAGNOSIS — Z4651 Encounter for fitting and adjustment of gastric lap band: Secondary | ICD-10-CM

## 2011-02-23 DIAGNOSIS — K602 Anal fissure, unspecified: Secondary | ICD-10-CM

## 2011-02-23 MED ORDER — AMBULATORY NON FORMULARY MEDICATION: 1.0000 "application " | Freq: Two times a day (BID) | Status: DC

## 2011-02-23 NOTE — Patient Instructions (Signed)
1. Stay on liquids for the next 2 days as you adapt to your new fill volume.  Then resume your previous diet. 2. Decreasing your carbohydrate intake will hasten you weight loss.  Rely more on proteins for your meals.  Avoid condiments that contain sweets such as Honey Mustard and sugary salad dressings.   3. Stay in the "green zone".  If you are regurgitating with meals, having night time reflux, and find yourself eating soft comfort foods (mashed potatoes, potato chips)...realize that you are developing "maladaptive eating".  You will not lose weight this way and may regain weight.  The GREEN ZONE is eating smaller portions and not regurgitating.  Hence we may need to withdraw fluid from your band. 4. Build exercise into your daily routine.  Walking is the best way to start but do something every day if you can.     If you vomit/regurgitate - stay on liquids for 24 hrs

## 2011-02-23 NOTE — Progress Notes (Signed)
Procedure: Status post laparoscopic adjustable gastric band placement with hiatal hernia repair on September 16, 2010  History of Present Ilness: 56 year old obese Caucasian female comes in today for her sixth postoperative appointment. I last saw her on Dec 6.  She states she is doing well.  She started to exercise with the BELT program this week. She feels as though she's not as restricted as she was last month. However she did have 2 episodes of regurgitation last week. It occurred during the midday after eating some rice. She also had some episode of where she regurgitated thick mucus. She's noticed that it occurs since restarting Claritin. She denies any reflux, wheezing, morning cough. She also complains of pain with defecation. She states that she's had a problem with anal fissures in the past. She says the pain only lasts for a few minutes. She denies any bleeding with bowel movements. She is having several bowel movements a day. She is only drinking one to 2 glasses of water per day. She did not do any exercise since her last visit in December except for what she has done today with the belt program.    Physical Exam: BP 116/78  Pulse 70  Temp(Src) 97.8 F (36.6 C) (Temporal)  Resp 16  Ht 5\' 9"  (1.753 m)  Wt 239 lb 6.4 oz (108.591 kg)  BMI 35.35 kg/m2   Wt gain since last visit: 2.8 pounds See LapBand flowsheet  Gen: alert, NAD, non-toxic appearing HEENT: normocephalic, atraumatic; pupils equal, no scleral icterus, neck supple, no lymphadenopathy Pulm: Lungs clear to auscultation, symmetric chest rise CV: regular rate and rhythm Abd: soft, nontender, nondistended. Well-healed trocar sites. No incisional hernia. Port is in right mid-abdomen. Healing areas around port site from adhesive reaction to bandage-no open wounds. Ext: no edema, normal, symmetric strength Neuro: nonfocal, sensation grossly intact Psych: appropriate, judgment normal Rectal- small anal fissure  Data  reviewed: I reviewed my last office note  Assessment and Plan: Status post laparoscopic adjustable gastric band placement with hiatal hernia repair-doing well. She has lost 21.6 pounds since surgery. She has gained 2.8 pounds since her last visit. I am glad she is now going to be doing some regularly scheduled exercising.   After obtaining verbal consent, the abdominal wall was prepped with Chloraprep. The port was accessed with a Huber needle and 0.25 cc of saline was added to give the patient an expected fill volume of 5.75 cc.  The patient was able to tolerate sips of water.   I encouraged her to try to exercise at least 4 days a week. I encouraged her to exercise for at least 30 minutes each time. I also encouraged to continue with Metamucil on a daily basis to help keep her regular.  With respect to her anal fissure, we will start nifedipine ointment. Also encouraged her to increase her water intake.  F/u 4 weeks  Mary Sella. Andrey Campanile, MD, FACS General, Bariatric, & Minimally Invasive Surgery [facility], [place]

## 2011-03-18 ENCOUNTER — Encounter: Payer: 59 | Attending: General Surgery | Admitting: *Deleted

## 2011-03-18 ENCOUNTER — Encounter: Payer: Self-pay | Admitting: *Deleted

## 2011-03-18 DIAGNOSIS — Z09 Encounter for follow-up examination after completed treatment for conditions other than malignant neoplasm: Secondary | ICD-10-CM | POA: Insufficient documentation

## 2011-03-18 DIAGNOSIS — Z713 Dietary counseling and surveillance: Secondary | ICD-10-CM | POA: Insufficient documentation

## 2011-03-18 DIAGNOSIS — Z9884 Bariatric surgery status: Secondary | ICD-10-CM | POA: Insufficient documentation

## 2011-03-18 NOTE — Progress Notes (Signed)
  Follow-up visit: 6 months Post-Operative LAGB Surgery  Medical Nutrition Therapy:  Appt start time: 1100 end time:  1130   Assessment:  Primary concerns today: post-operative bariatric surgery nutrition management. Mat[place] reports that she has not done very well with her dietary intake over the holidays. She feels that after her most recent fill per Dr. Andrey Campanile, she is now "in the zone". She continues to struggle with regurgitation which is likely due to behavioral eating habits of eating too much, eating too quickly, and large bites. She was also seen by myself at the [facility] BELT program this am where many of her questions about diet and exercise were answered. She is doing well over the past 3 weeks with consistent exercise. *Pt used Tanita scale today to measure %body fat and FFM as she begins to increase her muscle tissue. Will recheck in 10 weeks.  Weight today: 235.1 lbs Weight change: 3.1 lbs Total weight lost: 27.5 lbs BMI: 36.3% Weight goal: 175 lbs  Surgery date: 09/16/10 Start weight at [facility]: 262.6 lbs  24-hr recall:  B (AM): Premier Protein Shake Snk (AM): N/A L (PM): Meat w/ vegetables OR Food from [employer] (fried or grilled meat w/ vegetables) Snk (PM): Sweets OR Candy OR SF pudding  D (PM): Tomato soup w/ grilled cheese sandwiches Snk (PM): Protein Bar  Fluid intake: 20-30 oz Estimated total protein intake: 40-50 grams (Pt stopped tracking her intake over the past month)  Medications: No changes reported Supplementation: Taking supplements 50% of the time  Using straws: No Drinking while eating: No Hair loss: None reported Carbonated beverages: None N/V/D/C: Regurgitation with dry meats Last Lap-Band fill: 0.5cc fill per Dr. Andrey Campanile; 2 fills at this point  Recent physical activity:  No physical activity reported  Progress Towards Goal(s):  Some progress.  Handouts given during visit include:  Pre-Op Diet (Restart)  Protein Bar Guidelines (<15g carb, 10g  protein +)   Nutritional Diagnosis:  [place]-3.3 Overweight/obesity As related to recent LAGB surgery.  As evidenced by pt following LAGB dietary guidelines for continued weight loss.    Intervention:  Nutrition education.  Monitoring/Evaluation:  Dietary intake, exercise, lap band fills, and body weight. Follow up in 3 months for 6 month post-op visit.

## 2011-03-18 NOTE — Patient Instructions (Signed)
Goals:  Follow Phase 3B: High Protein + Non-Starchy Vegetables  OR Pre-Op Diet Meal Plan  Increase lean protein foods to meet 60-80g goal  Increase fluid intake to 64oz +  Avoid concentrated sweets  Limit bread, pasta, and rice portion sizes  Avoid drinking 15 minutes before, during and 30 minutes after eating  Aim for >30 min of physical activity daily 

## 2011-03-30 ENCOUNTER — Encounter (INDEPENDENT_AMBULATORY_CARE_PROVIDER_SITE_OTHER): Payer: Self-pay | Admitting: General Surgery

## 2011-03-30 ENCOUNTER — Ambulatory Visit (INDEPENDENT_AMBULATORY_CARE_PROVIDER_SITE_OTHER): Payer: Commercial Managed Care - PPO | Admitting: General Surgery

## 2011-03-30 VITALS — BP 108/76 | HR 66 | Temp 96.8°F | Resp 16 | Ht 69.0 in | Wt 237.2 lb

## 2011-03-30 DIAGNOSIS — Z9884 Bariatric surgery status: Secondary | ICD-10-CM

## 2011-03-30 DIAGNOSIS — E669 Obesity, unspecified: Secondary | ICD-10-CM

## 2011-03-30 NOTE — Patient Instructions (Signed)

## 2011-03-30 NOTE — Progress Notes (Signed)
Procedure: Status post laparoscopic adjustable gastric band placement with hiatal hernia repair on September 16, 2010  History of Present Ilness: 56 year old obese Caucasian female comes in today for her sixth postoperative appointment. I last saw her on Jan 7.  She states she is doing well.  She has been exercising with the BELT program for about 3 weeks now, 3 mornings a week. She feels as though she's better restricted. However she did not have any episodes of regurgitation.  She denies any reflux, wheezing, morning cough. She also complains of pain with defecation. She states the pain is better since using the nifedipine ointment.  However, she went away for a weekend and forgot the medication and didn't use it for several days and the pain returned. She states that she's had a problem with anal fissures in the past. She says the pain only lasts for a few minutes. She denies any bleeding with bowel movements. She is having several bowel movements a day. She has increased the number of glasses of water per day she has been drinking.   Physical Exam: BP 108/76  Pulse 66  Temp 96.8 F (36 C)  Resp 16  Ht 5\' 9"  (1.753 m)  Wt 237 lb 3.2 oz (107.593 kg)  BMI 35.03 kg/m2   Wt gain since last visit: 2.8 pounds See LapBand flowsheet  Gen: alert, NAD, non-toxic appearing HEENT: normocephalic, atraumatic; pupils equal, no scleral icterus, neck supple, no lymphadenopathy Pulm: Lungs clear to auscultation, symmetric chest rise CV: regular rate and rhythm Abd: soft, nontender, nondistended. Well-healed trocar sites. No incisional hernia. Port is in right mid-abdomen. Healing areas around port site from adhesive reaction to bandage-no open wounds. Ext: no edema, normal, symmetric strength Neuro: nonfocal, sensation grossly intact Psych: appropriate, judgment normal Rectal- deferred  Data reviewed: I reviewed my last office note  Assessment and Plan: Status post laparoscopic adjustable gastric band  placement with hiatal hernia repair-doing well. She has lost 23.8 pounds since surgery. She has lost 2.2 pounds since her last visit. I am glad she is now going to be doing some regularly scheduled exercising.   After obtaining verbal consent, the abdominal wall was prepped with Chloraprep. The port was accessed with a Huber needle and 0.25 cc of saline was added to give the patient an expected fill volume of 6 cc.  The patient was able to tolerate sips of water.   I encouraged her to try to exercise at least 4 days a week. I encouraged her to exercise for at least 30 minutes each time. I also encouraged to continue with Metamucil on a daily basis to help keep her regular.  With respect to her anal fissure, we will continue nifedipine ointment.   F/u 4 weeks  Mary Sella. Andrey Campanile, MD, FACS General, Bariatric, & Minimally Invasive Surgery [facility], [place]

## 2011-04-01 LAB — HM PAP SMEAR: HM Pap smear: NEGATIVE

## 2011-04-24 ENCOUNTER — Encounter (INDEPENDENT_AMBULATORY_CARE_PROVIDER_SITE_OTHER): Payer: Commercial Managed Care - PPO | Admitting: General Surgery

## 2011-05-13 ENCOUNTER — Encounter (INDEPENDENT_AMBULATORY_CARE_PROVIDER_SITE_OTHER): Payer: Self-pay | Admitting: General Surgery

## 2011-05-13 ENCOUNTER — Ambulatory Visit (INDEPENDENT_AMBULATORY_CARE_PROVIDER_SITE_OTHER): Payer: Commercial Managed Care - PPO | Admitting: General Surgery

## 2011-05-13 VITALS — BP 114/72 | HR 72 | Temp 97.9°F | Resp 16 | Ht 69.0 in | Wt 233.6 lb

## 2011-05-13 DIAGNOSIS — Z9884 Bariatric surgery status: Secondary | ICD-10-CM

## 2011-05-13 DIAGNOSIS — E669 Obesity, unspecified: Secondary | ICD-10-CM

## 2011-05-13 NOTE — Patient Instructions (Signed)
Keep up with the great work. Keep up with the BELT program

## 2011-05-13 NOTE — Progress Notes (Signed)
Procedure: Status post laparoscopic adjustable gastric band placement with hiatal hernia repair on September 16, 2010  History of Present Ilness: 56 year old obese Caucasian female comes in today for her follow-up. I last saw her on Feb 11.  She states she is doing well.  She continues to exercise with the BELT program. She can definitely tell in her exercise endurance. She also states she has had more energy.  She feels as though she's better restricted. After he last adjustment, she had 3 episodes of regurgitation. However for the past 3.5 weeks, she hasn't had any regurgitation. She can now only eat a small portion of food at a time.   She denies any reflux, wheezing, morning cough. Her pain with defecation has resolved. She is having several bowel movements a day. She has increased the number of glasses of water per day she has been drinking.   PMHx, PSHx, SOCHx, FAMHx, ALL reviewed and unchanged  Physical Exam: BP 114/72  Pulse 72  Temp(Src) 97.9 F (36.6 C) (Temporal)  Resp 16  Ht 5\' 9"  (1.753 m)  Wt 233 lb 9.6 oz (105.96 kg)  BMI 34.50 kg/m2   Wt gain since last visit: 3.6 pounds See LapBand flowsheet  Gen: alert, NAD, non-toxic appearing HEENT: normocephalic, atraumatic; pupils equal, no scleral icterus, neck supple, no lymphadenopathy Pulm: Lungs clear to auscultation, symmetric chest rise CV: regular rate and rhythm Abd: soft, nontender, nondistended. Well-healed trocar sites. No incisional hernia. Port is in right mid-abdomen. Healing areas around port site from adhesive reaction to bandage-no open wounds. Ext: no edema, normal, symmetric strength Neuro: nonfocal, sensation grossly intact Psych: appropriate, judgment normal Rectal- deferred  Data reviewed: I reviewed my last office note  Assessment and Plan: Status post laparoscopic adjustable gastric band placement with hiatal hernia repair-doing well. She has lost 27.4 pounds since surgery. She has lost 3.6 pounds since  her last visit. I am glad she is now going to be doing some regularly scheduled exercising.   I congratulated her on her weight loss. I encouraged her to keep up with the BELT program.  I believe we have her in the green zone now. No adjustment today  F/u 6 weeks  Mary Sella. Andrey Campanile, MD, FACS General, Bariatric, & Minimally Invasive Surgery [facility], [place]

## 2011-05-14 ENCOUNTER — Encounter: Payer: Self-pay | Admitting: *Deleted

## 2011-05-14 ENCOUNTER — Encounter: Payer: 59 | Attending: General Surgery | Admitting: *Deleted

## 2011-05-14 VITALS — Ht 68.0 in | Wt 230.0 lb

## 2011-05-14 DIAGNOSIS — Z713 Dietary counseling and surveillance: Secondary | ICD-10-CM | POA: Insufficient documentation

## 2011-05-14 DIAGNOSIS — Z09 Encounter for follow-up examination after completed treatment for conditions other than malignant neoplasm: Secondary | ICD-10-CM | POA: Insufficient documentation

## 2011-05-14 DIAGNOSIS — Z9884 Bariatric surgery status: Secondary | ICD-10-CM | POA: Insufficient documentation

## 2011-05-14 NOTE — Progress Notes (Signed)
  Follow-up visit: 9 months Post-Operative LAGB Surgery  Medical Nutrition Therapy:  Appt start time: 1100 end time:  1130   Assessment:  Primary concerns today: post-operative bariatric surgery nutrition management. Kathryn Pineda reports that she has episodes of regurgitation related to eating to fast, not chewing well, and eating dry foods. She is trying to be more conscious of eating behaviors.  Weight today: 230 lbs Weight change: 32.6 lbs BMI: 35% Weight goal: 175 lbs  Surgery date: 09/16/10 Start weight at [facility]: 262.6 lbs  *Currently doing a "mini fast" for [employer] 24-hr recall:  B (AM): Premier Protein Shake Snk (AM): N/A L (PM): Meat w/ vegetables OR Food from [employer] (fried or grilled meat w/ vegetables) OR Protein Shake Snk (PM): Sweets OR Pita chips (20) w/ cheese D (PM): Meat and vegetables OR Protein Shake Snk (PM): Protein Bar  Fluid intake: coffee, water, Crystal Light = 20-30 oz Estimated total protein intake: 50-65g  Medications: No changes reported Supplementation: Taking supplements 50% of the time  Using straws: No Drinking while eating: No Hair loss: None reported Carbonated beverages: None N/V/D/C: Regurgitation with eating too fast and eating out at restaurant Last Lap-Band fill: No recent fills; Only 2 fills at this point  Recent physical activity:  Kathryn Pineda participates in BELT program 3 times per week for 30-90 minutes  Progress Towards Goal(s):  Some progress.  Handouts given during visit include:  Pre-Op Diet (Restart)  Protein Bar Guidelines (<15g carb, 10g protein +)   Nutritional Diagnosis:  [place]-3.3 Overweight/obesity As related to recent LAGB surgery.  As evidenced by pt following LAGB dietary guidelines for continued weight loss.  Inadequate fluid intake related to long shifts at work and forgetfulness during the day to drink as evidenced by pt drinking ~50% of estimated fluid requirement.    Intervention:  Nutrition  education.  Monitoring/Evaluation:  Dietary intake, exercise, lap band fills, and body weight. Follow up in 3 months for 12 month post-op visit.

## 2011-05-14 NOTE — Patient Instructions (Addendum)
Goals:  Follow Phase 3B: High Protein + Non-Starchy Vegetables  OR Pre-Op Diet Meal Plan  Increase lean protein foods to meet 60-80g goal  Increase fluid intake to 64oz +  Avoid concentrated sweets  Limit bread, pasta, and rice portion sizes  Avoid drinking 15 minutes before, during and 30 minutes after eating  Aim for >30 min of physical activity daily

## 2011-06-03 ENCOUNTER — Ambulatory Visit: Payer: 59 | Admitting: *Deleted

## 2011-06-19 ENCOUNTER — Ambulatory Visit (INDEPENDENT_AMBULATORY_CARE_PROVIDER_SITE_OTHER): Payer: Commercial Managed Care - PPO | Admitting: General Surgery

## 2011-06-19 ENCOUNTER — Encounter (INDEPENDENT_AMBULATORY_CARE_PROVIDER_SITE_OTHER): Payer: Self-pay | Admitting: General Surgery

## 2011-06-19 VITALS — BP 116/72 | HR 70 | Temp 97.1°F | Resp 16 | Ht 69.0 in | Wt 228.0 lb

## 2011-06-19 DIAGNOSIS — Z9884 Bariatric surgery status: Secondary | ICD-10-CM

## 2011-06-19 DIAGNOSIS — E669 Obesity, unspecified: Secondary | ICD-10-CM

## 2011-06-19 NOTE — Patient Instructions (Signed)
Keep up with the exercising!!!

## 2011-06-19 NOTE — Progress Notes (Signed)
Procedure: Status post laparoscopic adjustable gastric band placement with hiatal hernia repair on September 16, 2010  History of Present Ilness: 56 year old obese Caucasian female comes in today for her follow-up. I last saw her on March 27.  She states she is doing well.  She continues to exercise with the BELT program. She can definitely tell in her exercise endurance. She also states she has had more energy.  She feels as though she's better restricted. Since her last visit, she had 1 episodes of regurgitation. .   She denies any reflux, wheezing, morning cough. She has run out of her MVI and is waiting on her order to come in. She is getting ready to transition out of the BELT program. She states she is going to resume going to her [employer] and riding a bike and swimming and continuing with light strength training. She continues to notice improvement in her energy and mood since exercising.    PMHx, PSHx, SOCHx, FAMHx, ALL reviewed and unchanged  Physical Exam: BP 116/72  Pulse 70  Temp(Src) 97.1 F (36.2 C) (Temporal)  Resp 16  Ht 5\' 9"  (1.753 m)  Wt 228 lb (103.42 kg)  BMI 33.67 kg/m2   Wt gain since last visit: 3.6 pounds See LapBand flowsheet  Gen: alert, NAD, non-toxic appearing HEENT: normocephalic, atraumatic; pupils equal, no scleral icterus, neck supple, no lymphadenopathy Pulm: Lungs clear to auscultation, symmetric chest rise CV: regular rate and rhythm Abd: soft, nontender, nondistended. Well-healed trocar sites. No incisional hernia. Port is in right mid-abdomen. Healing areas around port site from adhesive reaction to bandage-no open wounds. Ext: no edema, normal, symmetric strength Neuro: nonfocal, sensation grossly intact Psych: appropriate, judgment normal Rectal- deferred  Data reviewed: I reviewed my last office note  Assessment and Plan: Status post laparoscopic adjustable gastric band placement with hiatal hernia repair-doing well. She has lost 33 pounds since  surgery. She has lost 5.6 pounds since her last visit. I stressed to her the importance of continuing to exercise when she is released from the BELT program. I also encouraged her to wait about 1 to 1.5 minutes between food bites.   I congratulated her on her weight loss. I encouraged her to keep up with the BELT program.  I believe we have her in the green zone now. No adjustment today  F/u 6 weeks  Mary Sella. Andrey Campanile, MD, FACS General, Bariatric, & Minimally Invasive Surgery [facility], [place]

## 2011-06-23 ENCOUNTER — Other Ambulatory Visit: Payer: Self-pay | Admitting: Obstetrics & Gynecology

## 2011-06-23 DIAGNOSIS — Z1231 Encounter for screening mammogram for malignant neoplasm of breast: Secondary | ICD-10-CM

## 2011-07-21 ENCOUNTER — Encounter (INDEPENDENT_AMBULATORY_CARE_PROVIDER_SITE_OTHER): Payer: Self-pay | Admitting: General Surgery

## 2011-07-21 DIAGNOSIS — R32 Unspecified urinary incontinence: Secondary | ICD-10-CM | POA: Insufficient documentation

## 2011-07-22 ENCOUNTER — Ambulatory Visit (HOSPITAL_COMMUNITY)
Admission: RE | Admit: 2011-07-22 | Discharge: 2011-07-22 | Disposition: A | Discharge status: Home / Self Care | Payer: 59 | Source: Ambulatory Visit | Attending: Obstetrics & Gynecology | Admitting: Obstetrics & Gynecology

## 2011-07-22 DIAGNOSIS — Z1231 Encounter for screening mammogram for malignant neoplasm of breast: Secondary | ICD-10-CM | POA: Insufficient documentation

## 2011-07-23 ENCOUNTER — Ambulatory Visit (INDEPENDENT_AMBULATORY_CARE_PROVIDER_SITE_OTHER): Payer: Commercial Managed Care - PPO | Admitting: Physician Assistant

## 2011-07-23 ENCOUNTER — Encounter (INDEPENDENT_AMBULATORY_CARE_PROVIDER_SITE_OTHER): Payer: Self-pay

## 2011-07-23 NOTE — Patient Instructions (Signed)
Return in 4-6 weeks or sooner if necessary. 

## 2011-07-23 NOTE — Progress Notes (Signed)
  HISTORY: Kathryn Pineda is a 56 y.o.female who received an AP-Standard lap-band in July 2012 by Dr. Andrey Campanile. She comes in today with no new complaints. She's not consistently eating more food and her hunger is under good control. She's increasing her daily exercise as well. She believes the band is pretty tight as it is now but she's not having persistent regurgitation or reflux.  VITAL SIGNS: Filed Vitals:   07/23/11 1032  BP: 108/68    PHYSICAL EXAM: Physical exam reveals a very well-appearing 56 y.o.female in no apparent distress Neurologic: Awake, alert, oriented Psych: Bright affect, conversant Respiratory: Breathing even and unlabored. No stridor or wheezing Extremities: Atraumatic, good range of motion. Skin: Warm, Dry, no rashes Musculoskeletal: Normal gait, Joints normal  ASSESMENT: 56 y.o.  female  s/p AP-Standard lap-band.   PLAN: We deferred an adjustment today. She's going to continue her exercise program as well as watch her caloric intake. She had been using [employer] in the past and had good results, so she plans to resume that. We'll have her back in a month or so for re-evaluation and her one year anniversary appointment.

## 2011-09-02 ENCOUNTER — Encounter (INDEPENDENT_AMBULATORY_CARE_PROVIDER_SITE_OTHER): Payer: Commercial Managed Care - PPO | Admitting: General Surgery

## 2011-09-16 ENCOUNTER — Encounter: Payer: Self-pay | Admitting: *Deleted

## 2011-09-16 ENCOUNTER — Encounter: Payer: 59 | Attending: General Surgery | Admitting: *Deleted

## 2011-09-16 VITALS — Ht 69.0 in | Wt 230.5 lb

## 2011-09-16 DIAGNOSIS — E669 Obesity, unspecified: Secondary | ICD-10-CM

## 2011-09-16 DIAGNOSIS — Z09 Encounter for follow-up examination after completed treatment for conditions other than malignant neoplasm: Secondary | ICD-10-CM | POA: Insufficient documentation

## 2011-09-16 DIAGNOSIS — Z713 Dietary counseling and surveillance: Secondary | ICD-10-CM | POA: Insufficient documentation

## 2011-09-16 DIAGNOSIS — Z9884 Bariatric surgery status: Secondary | ICD-10-CM | POA: Insufficient documentation

## 2011-09-16 NOTE — Patient Instructions (Addendum)
Goals:  Follow Lapband Nutrition Guidelines   Aim for exercise at least 1 day/week; gradually increase to 3 days/week  Increase fluids to > 64 oz   Resume supplements daily

## 2011-09-16 NOTE — Progress Notes (Addendum)
  Follow-up visit:  12 months Post-Operative LAGB Surgery  Medical Nutrition Therapy:  Appt start time: 1000  End time: 1030   Primary concerns today:  Post-operative bariatric surgery nutrition management.  Kathryn Pineda returns today for 12 month f/u and reports that since she finished the BELT program, she has not been doing very well with exercise. She also reports some maladaptive eating, though she is trying to be more aware of this. Fluid intake is still down. Ran repeat Tanita scan and discussed getting back on track.  Weight today: 230.5 lbs Weight change: 0.5 lbs BMI: 34.0 kg/m^2 Weight goal: 175 lbs  Surgery date: 09/16/10 Start weight at [facility]: 262.6 lbs  TANITA  BODY COMP RESULTS  04/15/11 04/17/11 05/14/11 09/16/10   %Fat 42.1% 39.8% 45.9% 48.7%   Fat Mass (lbs) 98.0 92.0 105.0 112.5   Fat Free Mass (lbs) 134.5 139.5 123.5 118.0   Total Body Water (lbs) 98.5 102.0 90.5 86.5   24-hr recall: B (AM): Premier Protein Shake Snk (AM): N/A L (PM): Meat w/ vegetables Snk (PM):  D (PM): Meat and vegetables OR Premier Protein Bar Snk (PM): Protein Bar  Fluid intake: coffee, water, Crystal Light, protein shake = 30-40 oz Estimated total protein intake: 50-65g  Medications: No changes reported Supplementation: Taking supplements inconsistently. Discussed setting alarm on phone.  Using straws: No Drinking while eating: No Hair loss: None reported Carbonated beverages: None N/V/D/C: Regurgitation with eating too fast Last Lap-Band fill: No recent fills; Believes she is still in green zone, may have some maladaptive eating going on, so doesn't want fill at this point.  Recent physical activity: Has finished BELT program and having trouble getting to the gym. Planning to go 3 days a week.   Progress Towards Goal(s):  In progress.   Nutritional Diagnosis:  -3.3 Overweight/obesity As related to recent LAGB surgery.  As evidenced by pt following LAGB dietary guidelines for continued  weight loss.  Inadequate fluid intake related to long shifts at work and forgetfulness during the day to drink as evidenced by pt drinking ~50% of estimated fluid requirement.    Intervention:  Nutrition education.  Monitoring/Evaluation:  Dietary intake, exercise, lap band fills, and body weight. Follow up in 2 months for 14 month post-op visit.

## 2011-10-15 ENCOUNTER — Encounter (INDEPENDENT_AMBULATORY_CARE_PROVIDER_SITE_OTHER): Payer: Self-pay | Admitting: Physician Assistant

## 2011-10-15 ENCOUNTER — Ambulatory Visit (INDEPENDENT_AMBULATORY_CARE_PROVIDER_SITE_OTHER): Payer: Commercial Managed Care - PPO | Admitting: Physician Assistant

## 2011-10-15 VITALS — BP 112/76 | HR 72 | Resp 12 | Ht 69.0 in | Wt 231.6 lb

## 2011-10-15 DIAGNOSIS — Z4651 Encounter for fitting and adjustment of gastric lap band: Secondary | ICD-10-CM

## 2011-10-15 NOTE — Progress Notes (Signed)
  HISTORY: Kathryn Pineda is a 56 y.o.female who received an AP-Standard lap-band in July 2012 by Dr. Andrey Campanile. She comes in with a concern of maladaptive eating. She's trying to avoid slider foods but finds that the nutritious foods are difficult to get down. Her weight loss has stagnated.  VITAL SIGNS: Filed Vitals:   10/15/11 1018  BP: 112/76  Pulse: 72  Resp: 12    PHYSICAL EXAM: Physical exam reveals a very well-appearing 56 y.o.female in no apparent distress Neurologic: Awake, alert, oriented Psych: Bright affect, conversant Respiratory: Breathing even and unlabored. No stridor or wheezing Abdomen: Soft, nontender, nondistended to palpation. Incisions well-healed. No incisional hernias. Port easily palpated. Extremities: Atraumatic, good range of motion.  ASSESMENT: 56 y.o.  female  s/p AP-Standard lap-band.   PLAN: The patient's port was accessed with a 20G Huber needle without difficulty. Clear fluid was aspirated and 0.25 mL saline was removed from the port to give a total predicted volume of 5.75 mL. We talked about eating lean proteins and re-engaging in an exercise program. We'll have her back in a month to re-evaluate. I asked her to contact us sooner if her symptoms persist.

## 2011-10-15 NOTE — Patient Instructions (Signed)
Return in 1 month or sooner if needed. 

## 2011-11-12 ENCOUNTER — Encounter (INDEPENDENT_AMBULATORY_CARE_PROVIDER_SITE_OTHER): Payer: Self-pay

## 2011-11-12 ENCOUNTER — Ambulatory Visit (INDEPENDENT_AMBULATORY_CARE_PROVIDER_SITE_OTHER): Payer: Commercial Managed Care - PPO | Admitting: Physician Assistant

## 2011-11-12 DIAGNOSIS — Z9884 Bariatric surgery status: Secondary | ICD-10-CM

## 2011-11-12 NOTE — Progress Notes (Signed)
  HISTORY: Kathryn Pineda is a 56 y.o.female who received an AP-Standard lap-band in July 2012 by Dr. Andrey Campanile. She was seen a month ago for obstructive symptoms and maladaptive eating. 0.25 mL fluid was removed and since then she says she feels "normal again."  She's having no further regurgitation or globus sensation and she's able to swallow lean proteins without difficulty. She has yet to engage in a physical activity program but that's one of her goals this week.  VITAL SIGNS: Filed Vitals:   11/12/11 0838  BP: 130/82    PHYSICAL EXAM: Physical exam reveals a very well-appearing 56 y.o.female in no apparent distress Neurologic: Awake, alert, oriented Psych: Bright affect, conversant Respiratory: Breathing even and unlabored. No stridor or wheezing Extremities: Atraumatic, good range of motion. Skin: Warm, Dry, no rashes Musculoskeletal: Normal gait, Joints normal  ASSESMENT: 56 y.o.  female  s/p AP-Standard lap-band.   PLAN: We discussed focusing on lean proteins and overall good food choices as well as increasing physical activity. We'll have her back in three months or sooner if needed.

## 2011-11-12 NOTE — Patient Instructions (Addendum)
Return in three months. Focus on good food choices as well as an increase in physical activity. Return sooner if you have an increase in hunger, portion sizes or weight. Return also for difficulty swallowing, night cough, reflux.

## 2011-11-16 ENCOUNTER — Encounter: Payer: Self-pay | Admitting: *Deleted

## 2011-11-16 ENCOUNTER — Encounter: Payer: 59 | Attending: General Surgery | Admitting: *Deleted

## 2011-11-16 VITALS — Ht 69.0 in | Wt 236.0 lb

## 2011-11-16 DIAGNOSIS — Z09 Encounter for follow-up examination after completed treatment for conditions other than malignant neoplasm: Secondary | ICD-10-CM | POA: Insufficient documentation

## 2011-11-16 DIAGNOSIS — Z713 Dietary counseling and surveillance: Secondary | ICD-10-CM | POA: Insufficient documentation

## 2011-11-16 DIAGNOSIS — Z9884 Bariatric surgery status: Secondary | ICD-10-CM | POA: Insufficient documentation

## 2011-11-16 DIAGNOSIS — E669 Obesity, unspecified: Secondary | ICD-10-CM

## 2011-11-16 NOTE — Progress Notes (Addendum)
Follow-up visit:  14 Months Post-Operative LAGB Surgery  Medical Nutrition Therapy:  Appt start time: 1000  End time: 1030   Primary concerns today:  Post-operative bariatric surgery nutrition management.  Reports doing much better since 0.25 mL removed from band. Thinks she is still in the green zone and eating more food than protein shakes. Continues decreased fluid intake and is trying to cut out the sweets she had become accustomed to. Is trying to reincorporate exercise, though continues to be inconsistent. Feels she is in "better control" now that she feels "normal" again and can focus on exercise.    NOTE: reports extremely "itchy" rash on outside of right ankle. Does not respond to hydrocortisone or other lotions she has tried. Also reports more gas/bloating since increasing solid food intake.   Surgery date: 09/16/10 Start weight at [facility]: 262.6 lbs  Weight today: 236.0 lbs Weight change: 5.5 lbs (GAIN) Total weight lost: 26.2 lbs BMI: 34.9 kg/m^2  Weight goal: 175 lbs % goal met: 30%  TANITA  BODY COMP RESULTS  04/15/11 04/17/11 05/14/11 09/16/10 11/16/11   %Fat 42.1% 39.8% 45.9% 48.7% 49.0%   Fat Mass (lbs) 98.0 92.0 105.0 112.5 115.5   Fat Free Mass (lbs) 134.5 139.5 123.5 118.0 120.5   Total Body Water (lbs) 98.5 102.0 90.5 86.5 88.0   24-hr recall: B (AM): Premier Protein Shake (30g) Snk (AM): None L (PM): Chili w/ couscous over broccoli stuffed chicken breast (5-6 oz) Snk (PM): None D (PM): Meat and vegetables Snk (PM): [employer] choc frosty or sweets - old habit from when band too tight; working on decreasing these. Discussed Celebrate calcium chews in caramel.   Fluid intake: coffee, water, Crystal Light, protein shake = 30-40 oz Estimated total protein intake: 50-65g  Medications: No changes reported Supplementation: Taking supplements inconsistently. Plans to resume.   Using straws: No Drinking while eating: No Hair loss: None reported Carbonated beverages:  None N/V/D/C: Constipation returning after reintroducing foods; Discussed increasing fluids and fiber Last Lap-Band fill: 0.25 mL removed 10/15/11   Recent physical activity:  Inconsistent at this time   Progress Towards Goal(s):  In progress.   Nutritional Diagnosis:  [place]-3.3 Overweight/obesity As related to recent LAGB surgery.  As evidenced by pt following LAGB dietary guidelines for continued weight loss.  Inadequate fluid intake related to long shifts at work and forgetfulness to drink during the day as evidenced by pt drinking ~50% of estimated fluid requirement.    Intervention:  Nutrition education.  Monitoring/Evaluation:  Dietary intake, exercise, lap band fills, and body weight. Follow up in 1 month for 15 month post-op visit.

## 2011-11-16 NOTE — Patient Instructions (Addendum)
Goals:  Follow Lapband Nutrition Guidelines   Aim for exercise at least 1 day/week; gradually increase to 3 days/week  Increase fluids to > 64 oz   Increase protein to 60-80g goal  Resume supplements daily

## 2011-12-14 ENCOUNTER — Ambulatory Visit: Payer: 59 | Admitting: *Deleted

## 2011-12-16 ENCOUNTER — Ambulatory Visit: Payer: 59 | Admitting: *Deleted

## 2011-12-16 NOTE — Progress Notes (Signed)
Amoreena was late to appt. Just ran Tanita scan and she will reschedule for Dec 2013.    TANITA  BODY COMP RESULTS  04/15/11 04/17/11 05/14/11 09/16/10 11/16/11 12/16/11   %Fat 42.1% 39.8% 45.9% 48.7% 49.0% 48.5%   Fat Mass (lbs) 98.0 92.0 105.0 112.5 115.5 114.5   Fat Free Mass (lbs) 134.5 139.5 123.5 118.0 120.5 122.0   Total Body Water (lbs) 98.5 102.0 90.5 86.5 88.0 89.5

## 2011-12-23 ENCOUNTER — Other Ambulatory Visit: Payer: Self-pay | Admitting: Obstetrics & Gynecology

## 2011-12-23 DIAGNOSIS — N6489 Other specified disorders of breast: Secondary | ICD-10-CM

## 2011-12-24 ENCOUNTER — Ambulatory Visit
Admission: RE | Admit: 2011-12-24 | Discharge: 2011-12-24 | Disposition: A | Discharge status: Home / Self Care | Payer: 59 | Source: Ambulatory Visit | Attending: Obstetrics & Gynecology | Admitting: Obstetrics & Gynecology

## 2011-12-24 DIAGNOSIS — N6489 Other specified disorders of breast: Secondary | ICD-10-CM

## 2012-02-11 ENCOUNTER — Encounter (INDEPENDENT_AMBULATORY_CARE_PROVIDER_SITE_OTHER): Payer: Self-pay

## 2012-02-11 ENCOUNTER — Encounter (INDEPENDENT_AMBULATORY_CARE_PROVIDER_SITE_OTHER): Payer: Commercial Managed Care - PPO

## 2012-02-11 ENCOUNTER — Ambulatory Visit (INDEPENDENT_AMBULATORY_CARE_PROVIDER_SITE_OTHER): Payer: Commercial Managed Care - PPO | Admitting: Physician Assistant

## 2012-02-11 VITALS — BP 108/70 | HR 75 | Ht 69.0 in | Wt 237.4 lb

## 2012-02-11 DIAGNOSIS — Z4651 Encounter for fitting and adjustment of gastric lap band: Secondary | ICD-10-CM

## 2012-02-11 NOTE — Patient Instructions (Signed)
Take clear liquids tonight. Thin protein shakes are ok to start tomorrow morning. Slowly advance your diet thereafter. Call us if you have persistent vomiting or regurgitation, night cough or reflux symptoms. Return as scheduled or sooner if you notice no changes in hunger/portion sizes.  

## 2012-02-11 NOTE — Progress Notes (Signed)
  HISTORY: Kathryn Pineda is a 56 y.o.female who received an AP-Standard lap-band in July 2012 by Dr. Andrey Campanile. She comes in with 4 lbs of weight gain, increased portions and hunger. She rarely feels restriction. She wants a small adjustment to bring her back in to the green zone.  VITAL SIGNS: Filed Vitals:   02/11/12 1049  BP: 108/70  Pulse: 75    PHYSICAL EXAM: Physical exam reveals a very well-appearing 56 y.o.female in no apparent distress Neurologic: Awake, alert, oriented Psych: Bright affect, conversant Respiratory: Breathing even and unlabored. No stridor or wheezing Abdomen: Soft, nontender, nondistended to palpation. Incisions well-healed. No incisional hernias. Port easily palpated. Extremities: Atraumatic, good range of motion.  ASSESMENT: 56 y.o.  female  s/p AP-Standard lap-band.   PLAN: The patient's port was accessed with a 20G Huber needle without difficulty. Clear fluid was aspirated and 0.2 mL saline was added to the port to give a total predicted volume of 5.95 mL. The patient was able to swallow water without difficulty following the procedure and was instructed to take clear liquids for the next 24-48 hours and advance slowly as tolerated.

## 2012-05-12 ENCOUNTER — Encounter (INDEPENDENT_AMBULATORY_CARE_PROVIDER_SITE_OTHER): Payer: Commercial Managed Care - PPO

## 2012-06-09 ENCOUNTER — Ambulatory Visit (INDEPENDENT_AMBULATORY_CARE_PROVIDER_SITE_OTHER): Payer: Commercial Managed Care - PPO | Admitting: Physician Assistant

## 2012-06-09 ENCOUNTER — Encounter (INDEPENDENT_AMBULATORY_CARE_PROVIDER_SITE_OTHER): Payer: Self-pay

## 2012-06-09 DIAGNOSIS — Z9884 Bariatric surgery status: Secondary | ICD-10-CM

## 2012-06-09 NOTE — Progress Notes (Signed)
  HISTORY: Kathryn Pineda is a 57 y.o.female who received an AP-Standard lap-band in July 2012 by Dr. Andrey Campanile. She comes in with 5 lbs weight loss since her last visit. She had some lower GI issues including diarrhea and cramping which have resolved. She is having good restriction with her band. She denies persistent regurgitation or reflux.  VITAL SIGNS: Filed Vitals:   06/09/12 1427  BP: 138/82  Pulse: 57  Temp: 98.5 F (36.9 C)  Resp: 18    PHYSICAL EXAM: Physical exam reveals a very well-appearing 57 y.o.female in no apparent distress Neurologic: Awake, alert, oriented Psych: Bright affect, conversant Respiratory: Breathing even and unlabored. No stridor or wheezing Extremities: Atraumatic, good range of motion. Skin: Warm, Dry, no rashes Musculoskeletal: Normal gait, Joints normal  ASSESMENT: 57 y.o.  female  s/p AP-Standard lap-band.   PLAN: As she's in the green zone, we opted to defer an adjustment today. She agreed to contact us if she began having less or more restriction. I encouraged her to continue physical activity.

## 2012-06-09 NOTE — Patient Instructions (Signed)
Return in three months. Focus on good food choices as well as physical activity. Return sooner if you have an increase in hunger, portion sizes or weight. Return also for difficulty swallowing, night cough, reflux.   

## 2012-06-10 ENCOUNTER — Telehealth: Payer: Self-pay | Admitting: Obstetrics & Gynecology

## 2012-06-10 NOTE — Telephone Encounter (Signed)
Pt would like a fu appt for the lump under her right arm. Will be working Monday but nurse can leave a message. She will be home after 4pm.

## 2012-06-10 NOTE — Telephone Encounter (Signed)
Patient needs recheck of breast problem from 6 months ago and AEX.  Appt scheduled for 06-23-12.

## 2012-06-21 ENCOUNTER — Encounter: Payer: Self-pay | Admitting: Obstetrics & Gynecology

## 2012-06-22 ENCOUNTER — Encounter: Payer: Self-pay | Admitting: Obstetrics & Gynecology

## 2012-06-22 ENCOUNTER — Ambulatory Visit (INDEPENDENT_AMBULATORY_CARE_PROVIDER_SITE_OTHER): Payer: 59 | Admitting: Obstetrics & Gynecology

## 2012-06-22 VITALS — BP 100/68 | HR 74 | Resp 16 | Ht 69.0 in | Wt 236.0 lb

## 2012-06-22 DIAGNOSIS — Z0000 Encounter for general adult medical examination without abnormal findings: Secondary | ICD-10-CM

## 2012-06-22 DIAGNOSIS — Z01419 Encounter for gynecological examination (general) (routine) without abnormal findings: Secondary | ICD-10-CM

## 2012-06-22 DIAGNOSIS — E049 Nontoxic goiter, unspecified: Secondary | ICD-10-CM

## 2012-06-22 DIAGNOSIS — E559 Vitamin D deficiency, unspecified: Secondary | ICD-10-CM

## 2012-06-22 NOTE — Patient Instructions (Signed)

## 2012-06-22 NOTE — Progress Notes (Signed)
57 y.o. [id number] Married Caucasian F here for annual exam.  No vaginal bleeding.  No new breast issues.  Complains of four things today:  Recurrent rash left ankle, feeling of neck fullness/enlarged thyroid?, wants me to recheck breast, and occasional left upper quadrant pains since bariatric surgery was done.  No LMP recorded. Patient is postmenopausal.          Sexually active:yes  The current method of family planning is vasectomy.    Exercising:  yes Walking only  Smoker:  no  Health Maintenance: Pap:  04/01/11, neg with HR HPV MMG:  07/23/11 Screening Bilateral, Diagnostic 12/24/11 Colonoscopy:  12/15/10, Dr. Ewing Schlein, f/u 5 yrs BMD:   N/A TDaP:  02/2010 Labs: Waiting to speak with Dr. [name]   reports that she has never smoked. She has never used smokeless tobacco. She reports that  drinks alcohol. She reports that she does not use illicit drugs.  Past Medical History  Diagnosis Date  . Obesity   . Migraines   . Hemorrhoid   . GERD (gastroesophageal reflux disease)   . Phlebitis of leg   . Primary osteoarthritis of both knees   . SOB (shortness of breath)   . Fatigue     loss of sleep  . Breast pain   . Lump in female breast   . Hiatal hernia   . Arthritis   . Ear infection   . Sinus problem   . Rash   . Anal fissure   . Incontinence     stress related  . Generalized headaches   . Wilson's disease   . Condyloma   . Biliary colic 5/96    ERCP w/spincterotomy    Past Surgical History  Procedure Laterality Date  . Anal fissurectomy  1986  . Laparoscopic cholecystectomy  1997  . Bunionectomy Bilateral 2005  . Endovenous ablation saphenous vein w/ laser  2008    both lower legs  . Cesarean section  1990    prolapsed cord  . Laparoscopic gastric banding  09/16/10    w/hernia repair    Current Outpatient Prescriptions  Medication Sig Dispense Refill  . Ascorbic Acid (VITAMIN C PO) Take by mouth.      Marland Kitchen CALCIUM-VITAMIN D PO Take by mouth 3 (three) times daily.        . Cholecalciferol (VITAMIN D PO) Take by mouth.      Marland Kitchen GLUCOSAMINE PO Take by mouth daily.        Marland Kitchen guaiFENesin (MUCINEX) 600 MG 12 hr tablet Take 1,200 mg by mouth as needed.       Marland Kitchen ibuprofen (ADVIL,MOTRIN) 800 MG tablet Take 800 mg by mouth as needed.        . Loratadine (CLARITIN PO) Take by mouth as needed.        . Multiple Vitamin (MULTIVITAMIN) tablet Take 1 tablet by mouth daily.        No current facility-administered medications for this visit.    Family History  Problem Relation Age of Onset  . Diabetes Father   . Colon polyps Father   . Osteoarthritis Mother   . Hypertension Brother   . Cancer Maternal Uncle     leukemia  . Cancer Maternal Grandmother     unaware    ROS:  Pertinent items are noted in HPI.  Otherwise, a comprehensive ROS was negative.  Exam:   BP 100/68  Pulse 74  Resp 16  Ht 5\' 9"  (1.753 m)  Wt 236 lb (107.049  kg)  BMI 34.84 kg/m2     Height: 5\' 9"  (175.3 cm)  Ht Readings from Last 3 Encounters:  06/22/12 5\' 9"  (1.753 m)  06/09/12 5' 8.5" (1.74 m)  02/11/12 5\' 9"  (1.753 m)    General appearance: alert, cooperative and appears stated age Head: Normocephalic, without obvious abnormality, atraumatic Neck: no adenopathy, supple, symmetrical, trachea midline and thyroid normal to inspection and palpation Lungs: clear to auscultation bilaterally Breasts: normal appearance, no masses or tenderness Heart: regular rate and rhythm Abdomen: soft, non-tender; bowel sounds normal; no masses,  no organomegaly Extremities: extremities normal, atraumatic, no cyanosis or edema Skin: Skin color, texture, turgor normal. No rashes or lesions Lymph nodes: Cervical, supraclavicular, and axillary nodes normal. No abnormal inguinal nodes palpated Neurologic: Grossly normal   Pelvic: External genitalia:  no lesions              Urethra:  normal appearing urethra with no masses, tenderness or lesions              Bartholins and Skenes: normal                  Vagina: normal appearing vagina with normal color and discharge, no lesions              Cervix: no lesions              Pap taken: no Bimanual Exam:  Uterus:  normal size, contour, position, consistency, mobility, non-tender              Adnexa: no mass, fullness, tenderness               Rectovaginal: Confirms               Anus:  normal sphincter tone, no lesions  A:  Well Woman with normal exam PMP, no HRT Chronic left ankle rash Pt feels thyroid is enlarged.  Exam normal. Obesity  P:   Mammogram yearly.  Due June 2014 pap smear with neg HR HPV 2/13 Thyroid ultrasound CMP, Lipids, Vit D, TSH today. Pt will call and make dermatology appt. return annually or prn  An After Visit Summary was printed and given to the patient.

## 2012-06-28 ENCOUNTER — Other Ambulatory Visit: Payer: Self-pay | Admitting: Obstetrics & Gynecology

## 2012-06-28 DIAGNOSIS — Z1231 Encounter for screening mammogram for malignant neoplasm of breast: Secondary | ICD-10-CM

## 2012-06-28 LAB — COMPREHENSIVE METABOLIC PANEL
Albumin: 4.4 g/dL (ref 3.5–5.2)
BUN: 10 mg/dL (ref 6–23)
CO2: 26 mEq/L (ref 19–32)
Calcium: 8.9 mg/dL (ref 8.4–10.5)
Chloride: 106 mEq/L (ref 96–112)
Glucose, Bld: 87 mg/dL (ref 70–99)
Potassium: 4.3 mEq/L (ref 3.5–5.3)

## 2012-06-28 LAB — LIPID PANEL
Cholesterol: 198 mg/dL (ref 0–200)
HDL: 68 mg/dL (ref >39)
Triglycerides: 66 mg/dL (ref <150)

## 2012-06-29 ENCOUNTER — Encounter: Payer: Self-pay | Admitting: Obstetrics & Gynecology

## 2012-06-29 NOTE — Addendum Note (Signed)
Addended by: [name] on: 06/29/2012 10:01 AM   Modules accepted: Orders

## 2012-06-30 ENCOUNTER — Telehealth: Payer: Self-pay | Admitting: Orthopedic Surgery

## 2012-06-30 NOTE — Telephone Encounter (Signed)
Spoke with pt about appt at [facility] for thyroid US 07-05-12 arriving at 9:00 for 9:15 appt. [address] location. Pt agreeable.

## 2012-06-30 NOTE — Addendum Note (Signed)
Addended by: [name] on: 06/30/2012 02:57 PM   Modules accepted: Orders

## 2012-07-01 ENCOUNTER — Other Ambulatory Visit: Payer: 59

## 2012-07-05 ENCOUNTER — Ambulatory Visit
Admission: RE | Admit: 2012-07-05 | Discharge: 2012-07-05 | Disposition: A | Discharge status: Home / Self Care | Payer: 59 | Source: Ambulatory Visit | Attending: Obstetrics & Gynecology | Admitting: Obstetrics & Gynecology

## 2012-07-05 DIAGNOSIS — E049 Nontoxic goiter, unspecified: Secondary | ICD-10-CM

## 2012-07-07 ENCOUNTER — Telehealth: Payer: Self-pay

## 2012-07-07 NOTE — Telephone Encounter (Signed)
5/22 [id number]//kn 

## 2012-07-07 NOTE — Telephone Encounter (Signed)
Message copied by Elisha Headland on Thu Jul 07, 2012  3:55 PM ------      Message from: [name]      Created: Tue Jul 05, 2012  4:23 PM       Please call patient and let her know ultrasound showed no nodule/masses. ------

## 2012-07-07 NOTE — Telephone Encounter (Signed)
Patient notified of results.

## 2012-08-01 ENCOUNTER — Ambulatory Visit (HOSPITAL_COMMUNITY)
Admission: RE | Admit: 2012-08-01 | Discharge: 2012-08-01 | Disposition: A | Discharge status: Home / Self Care | Payer: 59 | Source: Ambulatory Visit | Attending: Obstetrics & Gynecology | Admitting: Obstetrics & Gynecology

## 2012-08-01 DIAGNOSIS — Z1231 Encounter for screening mammogram for malignant neoplasm of breast: Secondary | ICD-10-CM | POA: Insufficient documentation

## 2012-09-08 ENCOUNTER — Encounter (INDEPENDENT_AMBULATORY_CARE_PROVIDER_SITE_OTHER): Payer: Commercial Managed Care - PPO

## 2012-09-15 ENCOUNTER — Encounter (INDEPENDENT_AMBULATORY_CARE_PROVIDER_SITE_OTHER): Payer: Self-pay | Admitting: Physician Assistant

## 2012-09-15 ENCOUNTER — Ambulatory Visit (INDEPENDENT_AMBULATORY_CARE_PROVIDER_SITE_OTHER): Payer: Commercial Managed Care - PPO | Admitting: Physician Assistant

## 2012-09-15 VITALS — BP 110/68 | HR 74 | Resp 16 | Ht 69.0 in | Wt 241.0 lb

## 2012-09-15 DIAGNOSIS — Z4651 Encounter for fitting and adjustment of gastric lap band: Secondary | ICD-10-CM

## 2012-09-15 NOTE — Progress Notes (Signed)
  HISTORY: Kathryn Pineda is a 57 y.o.female who received an AP-Standard lap-band in July 2012 by Dr. Andrey Campanile. She comes in with 8 lbs weight gain since her last visit in April. She is concerned that she may be eating too much. She denies persistent regurgitation or reflux.  VITAL SIGNS: Filed Vitals:   09/15/12 1556  BP: 110/68  Pulse: 74  Resp: 16    PHYSICAL EXAM: Physical exam reveals a very well-appearing 57 y.o.female in no apparent distress Neurologic: Awake, alert, oriented Psych: Bright affect, conversant Respiratory: Breathing even and unlabored. No stridor or wheezing Abdomen: Soft, nontender, nondistended to palpation. Incisions well-healed. No incisional hernias. Port easily palpated. Extremities: Atraumatic, good range of motion.  ASSESMENT: 57 y.o.  female  s/p AP-Standard lap-band.   PLAN: The patient's port was accessed with a 20G Huber needle without difficulty. Clear fluid was aspirated and 0.2 mL saline was added to the port to give a total predicted volume of 6.15 mL. The patient was able to swallow water without difficulty following the procedure and was instructed to take clear liquids for the next 24-48 hours and advance slowly as tolerated.

## 2012-09-15 NOTE — Patient Instructions (Signed)

## 2012-12-08 ENCOUNTER — Encounter (INDEPENDENT_AMBULATORY_CARE_PROVIDER_SITE_OTHER): Payer: Commercial Managed Care - PPO

## 2012-12-12 ENCOUNTER — Encounter (INDEPENDENT_AMBULATORY_CARE_PROVIDER_SITE_OTHER): Payer: Self-pay | Admitting: Physician Assistant

## 2012-12-22 ENCOUNTER — Other Ambulatory Visit: Payer: Self-pay

## 2013-02-23 ENCOUNTER — Encounter (INDEPENDENT_AMBULATORY_CARE_PROVIDER_SITE_OTHER): Payer: Self-pay

## 2013-02-23 ENCOUNTER — Ambulatory Visit (INDEPENDENT_AMBULATORY_CARE_PROVIDER_SITE_OTHER): Payer: Commercial Managed Care - PPO | Admitting: Physician Assistant

## 2013-02-23 VITALS — BP 120/70 | HR 72 | Temp 98.5°F | Resp 14 | Ht 69.0 in | Wt 236.8 lb

## 2013-02-23 DIAGNOSIS — Z9884 Bariatric surgery status: Secondary | ICD-10-CM

## 2013-02-23 NOTE — Patient Instructions (Signed)
Return in six months. Focus on good food choices as well as physical activity. Return sooner if you have an increase in hunger, portion sizes or weight. Return also for difficulty swallowing, night cough, reflux.   

## 2013-02-23 NOTE — Progress Notes (Signed)
  HISTORY: Kathryn Pineda is a 58 y.o.female who received an AP-Standard lap-band in July 2012 by Dr. Andrey CampanileWilson. She comes in with 4 lbs weight loss since her last visit in July 2014. She reports having gained 12 lbs during this time but has worked hard to get them back off, which has succeeded. She has only rare regurgitation which she attributes to eating too quickly. She is currently on a gluten-free, restricted lactose diet which she says makes her feel much better. She's very satisfied with her current state of restriction and is concerned that any more fluid would be too much.  VITAL SIGNS: Filed Vitals:   02/23/13 1612  BP: 120/70  Pulse: 72  Temp: 98.5 F (36.9 C)  Resp: 14    PHYSICAL EXAM: Physical exam reveals a very well-appearing 58 y.o.female in no apparent distress Neurologic: Awake, alert, oriented Psych: Bright affect, conversant Respiratory: Breathing even and unlabored. No stridor or wheezing Extremities: Atraumatic, good range of motion. Skin: Warm, Dry, no rashes Musculoskeletal: Normal gait, Joints normal  ASSESMENT: 58 y.o.  female  s/p AP-Standard lap-band.   PLAN: It sounds like she's in the green zone. She attributes her previous weight gain to sweets and excessive carbohydrates. We'll have her come back in six months or sooner if needed.

## 2013-07-20 ENCOUNTER — Telehealth: Payer: Self-pay | Admitting: Obstetrics & Gynecology

## 2013-07-20 NOTE — Telephone Encounter (Signed)
Patient is going out of the country on a mission trip and requests Dr. [name] call in an RX for Vivotif Berna EC Capsule--generic for typhoid vaccine live.  [employer] [phone]

## 2013-07-21 NOTE — Telephone Encounter (Signed)
Dr. [name], is this something you are able to call in for pt?

## 2013-07-26 NOTE — Telephone Encounter (Signed)
Message left to return call to Tyshell Ramberg at [phone].    

## 2013-07-26 NOTE — Telephone Encounter (Signed)
Can you see if this is carried at [facility]?  If so, you can call it in.  If complicated process, refer to travel clinic at [facility].

## 2013-07-27 NOTE — Telephone Encounter (Signed)
Patient returned call. She states she has been seen at [facility] and has started on her medications. She greatly appreciates Dr. [name] offering to place rx but she does not need it at this time.  Routing to provider for final review. Patient agreeable to disposition. Will close encounter

## 2013-09-15 ENCOUNTER — Other Ambulatory Visit: Payer: Self-pay | Admitting: Obstetrics & Gynecology

## 2013-09-15 ENCOUNTER — Encounter: Payer: Self-pay | Admitting: Obstetrics & Gynecology

## 2013-09-15 ENCOUNTER — Ambulatory Visit (INDEPENDENT_AMBULATORY_CARE_PROVIDER_SITE_OTHER): Payer: 59 | Admitting: Obstetrics & Gynecology

## 2013-09-15 VITALS — BP 92/76 | HR 68 | Resp 18 | Ht 68.5 in | Wt 237.0 lb

## 2013-09-15 DIAGNOSIS — Z0000 Encounter for general adult medical examination without abnormal findings: Secondary | ICD-10-CM

## 2013-09-15 DIAGNOSIS — Z01419 Encounter for gynecological examination (general) (routine) without abnormal findings: Secondary | ICD-10-CM

## 2013-09-15 DIAGNOSIS — Z1231 Encounter for screening mammogram for malignant neoplasm of breast: Secondary | ICD-10-CM

## 2013-09-15 DIAGNOSIS — E559 Vitamin D deficiency, unspecified: Secondary | ICD-10-CM

## 2013-09-15 DIAGNOSIS — Z124 Encounter for screening for malignant neoplasm of cervix: Secondary | ICD-10-CM

## 2013-09-15 LAB — POCT URINALYSIS DIPSTICK
Bilirubin, UA: NEGATIVE
Blood, UA: NEGATIVE
Glucose, UA: NEGATIVE
KETONES UA: NEGATIVE
Leukocytes, UA: NEGATIVE
Nitrite, UA: NEGATIVE
PROTEIN UA: NEGATIVE
UROBILINOGEN UA: NEGATIVE
pH, UA: 5

## 2013-09-15 MED ORDER — ESTRADIOL 0.1 MG/GM VA CREA: TOPICAL_CREAM | VAGINAL | Status: DC

## 2013-09-15 NOTE — Progress Notes (Signed)
58 y.o. [id number] MarriedCaucasianF here for annual exam.  Went on mission trip trip to [place] with her husband and oldest daughter.  Great experience.  Daughter leaving for college in three weeks.  Daughter is going to [employer] as an Garment/textile technologist.  Pt reports after doing the mission team and working on building a house, she has worsening symptoms of her rectocele/cysotcele.  No vaginal bleeding.  Does have rectal bleeding from time to time.  H/o hemorrhoids.    Hasn't seen a PCP in five years.  Going to change PCP.     No LMP recorded. Patient is postmenopausal.          Sexually active: Yes.    The current method of family planning is post menopausal status.    Exercising: Yes.    Walking daily Smoker:  no  Health Maintenance: Pap: 03/2011 Neg. HR HPV neg History of abnormal Pap:  no MMG: 07/2012 BIRADS1: neg Colonoscopy: 2012, Dr. Ewing Schlein BMD:  N/A TDaP: 2012 Screening Labs: 2014, Hb today: n/a, Urine today:Neg   reports that she has never smoked. She has never used smokeless tobacco. She reports that she drinks alcohol. She reports that she does not use illicit drugs.  Past Medical History  Diagnosis Date  . Obesity   . Migraines   . Hemorrhoid   . GERD (gastroesophageal reflux disease)   . Phlebitis of leg   . Primary osteoarthritis of both knees   . SOB (shortness of breath)   . Fatigue     loss of sleep  . Breast pain   . Lump in female breast   . Hiatal hernia   . Arthritis   . Ear infection   . Sinus problem   . Rash   . Anal fissure   . Incontinence     stress related  . Generalized headaches   . Wilson's disease   . Condyloma   . Biliary colic 5/96    ERCP w/spincterotomy    Past Surgical History  Procedure Laterality Date  . Anal fissurectomy  1986  . Laparoscopic cholecystectomy  1997  . Bunionectomy Bilateral 2005  . Endovenous ablation saphenous vein w/ laser  2008    both lower legs  . Cesarean section  1990    prolapsed cord  . Laparoscopic gastric  banding  09/16/10    w/hernia repair    Current Outpatient Prescriptions  Medication Sig Dispense Refill  . ibuprofen (ADVIL,MOTRIN) 200 MG tablet Take 200 mg by mouth every 6 (six) hours as needed.      . Ascorbic Acid (VITAMIN C PO) Take by mouth.      Marland Kitchen CALCIUM-VITAMIN D PO Take by mouth 3 (three) times daily.       . Cholecalciferol (VITAMIN D PO) Take by mouth.      . Loratadine (CLARITIN PO) Take by mouth as needed.        . Multiple Vitamin (MULTIVITAMIN) tablet Take 1 tablet by mouth daily.        No current facility-administered medications for this visit.    Family History  Problem Relation Age of Onset  . Diabetes Father   . Colon polyps Father   . Osteoarthritis Mother   . Hypertension Brother   . Cancer Maternal Uncle     leukemia  . Cancer Maternal Grandmother     unaware    ROS:  Pertinent items are noted in HPI.  Otherwise, a comprehensive ROS was negative.  Exam:   BP 92/76  Pulse 68  Resp 18  Ht 5' 8.5" (1.74 m)  Wt 237 lb (107.502 kg)  BMI 35.51 kg/m2  Weight change: -1#  Height: 5' 8.5" (174 cm)  Ht Readings from Last 3 Encounters:  09/15/13 5' 8.5" (1.74 m)  02/23/13 5\' 9"  (1.753 m)  09/15/12 5\' 9"  (1.753 m)    General appearance: alert, cooperative and appears stated age Head: Normocephalic, without obvious abnormality, atraumatic Neck: no adenopathy, supple, symmetrical, trachea midline and thyroid normal to inspection and palpation Lungs: clear to auscultation bilaterally Breasts: normal appearance, no masses or tenderness Heart: regular rate and rhythm Abdomen: soft, non-tender; bowel sounds normal; no masses,  no organomegaly Extremities: extremities normal, atraumatic, no cyanosis or edema Skin: Skin color, texture, turgor normal. No rashes or lesions Lymph nodes: Cervical, supraclavicular, and axillary nodes normal. No abnormal inguinal nodes palpated Neurologic: Grossly normal   Pelvic: External genitalia:  no lesions               Urethra:  normal appearing urethra with no masses, tenderness or lesions              Bartholins and Skenes: normal                 Vagina: normal appearing vagina with normal color and discharge, no lesions              Cervix: no lesions              Pap taken: Yes.   Bimanual Exam:  Uterus:  normal size, contour, position, consistency, mobility, non-tender              Adnexa: normal adnexa and no mass, fullness, tenderness               Rectovaginal: Confirms               Anus:  normal sphincter tone, no lesions  A:  Well Woman with normal exam  PMP, no HRT  Obesity  Cystocele  P: Mammogram yearly.  Due.  Will schedule for pt. pap smear with neg HR HPV 2/13.  Pap today Pt to start OTC vit d 2000-3000 IU weekly.  Check Vit D 3 month. Trial of estrace 1 gm pv twice weekly.  Rx to pharmacy.  Pt to give me update after 6-8 weeks. return annually or prn   An After Visit Summary was printed and given to the patient.

## 2013-09-19 ENCOUNTER — Ambulatory Visit (HOSPITAL_COMMUNITY)
Admission: RE | Admit: 2013-09-19 | Discharge: 2013-09-19 | Disposition: A | Discharge status: Home / Self Care | Payer: 59 | Source: Ambulatory Visit | Attending: Obstetrics & Gynecology | Admitting: Obstetrics & Gynecology

## 2013-09-19 ENCOUNTER — Telehealth: Payer: Self-pay | Admitting: Obstetrics & Gynecology

## 2013-09-19 DIAGNOSIS — Z1231 Encounter for screening mammogram for malignant neoplasm of breast: Secondary | ICD-10-CM | POA: Insufficient documentation

## 2013-09-19 LAB — IPS PAP TEST WITH REFLEX TO HPV

## 2013-09-19 MED ORDER — ESTRADIOL 0.1 MG/GM VA CREA: TOPICAL_CREAM | VAGINAL | Status: DC

## 2013-09-19 NOTE — Telephone Encounter (Signed)
Spoke with patient at time of incoming call. Patient states that she is at the pharmacy and her rx from Dr.Miller is not there. Advised patient [facility] for est[facility]. [facility] [place] [place] [place]. Prefers rx be sent over there so she can wait to pick it up. [facility] [place] Estrace 0.1mg  42.5g 2RF. Patient agreeable and verbalizes understanding.  Routing to provider for final review. Patient agreeable to disposition. Will close encounter

## 2013-09-28 ENCOUNTER — Encounter (INDEPENDENT_AMBULATORY_CARE_PROVIDER_SITE_OTHER): Payer: Self-pay

## 2013-09-28 ENCOUNTER — Other Ambulatory Visit (INDEPENDENT_AMBULATORY_CARE_PROVIDER_SITE_OTHER): Payer: Self-pay | Admitting: *Deleted

## 2013-09-28 ENCOUNTER — Ambulatory Visit (INDEPENDENT_AMBULATORY_CARE_PROVIDER_SITE_OTHER): Payer: Commercial Managed Care - PPO | Admitting: Physician Assistant

## 2013-09-28 DIAGNOSIS — K649 Unspecified hemorrhoids: Secondary | ICD-10-CM

## 2013-09-28 NOTE — Patient Instructions (Signed)
Return in six months. Focus on good food choices as well as physical activity. Return sooner if you have an increase in hunger, portion sizes or weight. Return also for difficulty swallowing, night cough, reflux. Use nifedipine ointment for hemorrhoids. Follow-up with your gastroenterologist if your diarrhea persists.

## 2013-09-28 NOTE — Progress Notes (Signed)
  HISTORY: [name] is a 58 y.o.female who received an AP-Standard lap-band in July 2012 by Dr. Andrey CampanileWilson. The patient has maintained stable weight since their last visit in January, and has lost 23 lbs since surgery. She comes in today very pleased with where her band is. She is not eating too much nor is she particularly hungry. She has an episode of regurgitation once every three weeks or so which she attributes to not chewing adequately. She is working on improving her food choices overall. She is having diarrhea, which she's had in the past. This is irritating her hemorrhoids for which she has seen Dr. Andrey CampanileWilson in the past. She was given nifedipine ointment which worked well for her. She would like a refill of this.  VITAL SIGNS: Filed Vitals:   09/28/13 0844  BP: 122/78  Pulse: 73  Temp: 97.5 F (36.4 C)    PHYSICAL EXAM: Physical exam reveals a very well-appearing 58 y.o.female in no apparent distress Neurologic: Awake, alert, oriented Psych: Bright affect, conversant Respiratory: Breathing even and unlabored. No stridor or wheezing Extremities: Atraumatic, good range of motion. Skin: Warm, Dry, no rashes Musculoskeletal: Normal gait, Joints normal  ASSESMENT: 58 y.o.  female  s/p AP-Standard lap-band. Hemorrhoids.  PLAN: I wrote for Nifedipine ointment 2%, AAA BID PRN, 60 g, NR. She is going to make some dietary changes to help get some more weight off (namely reducing sugar). She will follow up with GI if her diarrhea persists. We'll have her back in six months or sooner if needed.

## 2013-12-01 ENCOUNTER — Other Ambulatory Visit: Payer: Self-pay

## 2013-12-18 ENCOUNTER — Encounter (INDEPENDENT_AMBULATORY_CARE_PROVIDER_SITE_OTHER): Payer: Self-pay

## 2014-02-24 ENCOUNTER — Ambulatory Visit (INDEPENDENT_AMBULATORY_CARE_PROVIDER_SITE_OTHER): Payer: 59 | Admitting: Emergency Medicine

## 2014-02-24 ENCOUNTER — Ambulatory Visit (INDEPENDENT_AMBULATORY_CARE_PROVIDER_SITE_OTHER): Payer: 59

## 2014-02-24 VITALS — BP 120/80 | HR 69 | Temp 98.8°F | Resp 16 | Ht 68.5 in | Wt 244.0 lb

## 2014-02-24 DIAGNOSIS — J029 Acute pharyngitis, unspecified: Secondary | ICD-10-CM

## 2014-02-24 DIAGNOSIS — R05 Cough: Secondary | ICD-10-CM

## 2014-02-24 DIAGNOSIS — R059 Cough, unspecified: Secondary | ICD-10-CM

## 2014-02-24 DIAGNOSIS — M542 Cervicalgia: Secondary | ICD-10-CM

## 2014-02-24 LAB — POCT CBC
Granulocyte percent: 59.4 %G (ref 37–80)
HEMATOCRIT: 41.7 % (ref 37.7–47.9)
Hemoglobin: 13.6 g/dL (ref 12.2–16.2)
Lymph, poc: 1.6 (ref 0.6–3.4)
MCH, POC: 31 pg (ref 27–31.2)
MCHC: 32.7 g/dL (ref 31.8–35.4)
MCV: 94.7 fL (ref 80–97)
MID (cbc): 0.4 (ref 0–0.9)
MPV: 7.5 fL (ref 0–99.8)
POC GRANULOCYTE: 2.9 (ref 2–6.9)
POC LYMPH PERCENT: 32.8 %L (ref 10–50)
POC MID %: 7.8 %M (ref 0–12)
Platelet Count, POC: 240 10*3/uL (ref 142–424)
RBC: 4.4 M/uL (ref 4.04–5.48)
RDW, POC: 13.4 %
WBC: 4.9 10*3/uL (ref 4.6–10.2)

## 2014-02-24 LAB — POCT RAPID STREP A (OFFICE): RAPID STREP A SCREEN: NEGATIVE

## 2014-02-24 MED ORDER — MELOXICAM 7.5 MG PO TABS: ORAL_TABLET | ORAL | Status: DC

## 2014-02-24 MED ORDER — CYCLOBENZAPRINE HCL 10 MG PO TABS
ORAL_TABLET | ORAL | Status: DC
Start: 2014-02-24 — End: 2014-02-24

## 2014-02-24 MED ORDER — CYCLOBENZAPRINE HCL 10 MG PO TABS: ORAL_TABLET | ORAL | Status: DC

## 2014-02-24 NOTE — Progress Notes (Addendum)
   Subjective:    Patient ID: Kathryn Pineda, female    DOB: 19-Mar-1955, 59 y.o.   MRN: [id number]  Neck Pain  This is a new problem. The current episode started 1 to 4 weeks ago. The problem occurs constantly. The problem has been gradually worsening. The pain is present in the right side. The quality of the pain is described as aching and burning. The symptoms are aggravated by coughing, position, sneezing and twisting. The pain is same all the time.  Cough   patient had a respiratory illness approximately 3-4 weeks ago. She had a bad sore throat she had some emesis which inflamed her sinuses. She had some cough and fever associated with this illness which she felt was viral. The symptoms have gradually improved and yesterday noticed significant pain on the right side of her neck and some pain into the superior right shoulder. She denies weakness in the upper extremity.    Review of Systems  Respiratory: Positive for cough.   Musculoskeletal: Positive for neck pain.       Objective:   Physical Exam  Constitutional: She appears well-developed and well-nourished.  HENT:  Head: Normocephalic.  Right Ear: External ear normal.  Left Ear: External ear normal.  Mouth/Throat: Oropharynx is clear and moist.  Eyes: Pupils are equal, round, and reactive to light.  Neck: No tracheal deviation present. No thyromegaly present.  There is tenderness on the right side of the neck. There is pain with twisting to the right and to the left. She has limited flexion extension.  Cardiovascular: Normal rate.   Pulmonary/Chest: Effort normal and breath sounds normal.  Musculoskeletal:  There is tenderness is noted the right side of the neck. Deep tendon reflexes of the upper extremities are 2-3+. Motor strength is 5 out of 5. There is limited range of motion of the neck as previously noted   Lymphadenopathy:    She has no cervical adenopathy.   Results for orders placed or performed in visit on 02/24/14    POCT CBC  Result Value Ref Range   WBC 4.9 4.6 - 10.2 K/uL   Lymph, poc 1.6 0.6 - 3.4   POC LYMPH PERCENT 32.8 10 - 50 %L   MID (cbc) 0.4 0 - 0.9   POC MID % 7.8 0 - 12 %M   POC Granulocyte 2.9 2 - 6.9   Granulocyte percent 59.4 37 - 80 %G   RBC 4.40 4.04 - 5.48 M/uL   Hemoglobin 13.6 12.2 - 16.2 g/dL   HCT, POC 40.941.7 81.137.7 - 47.9 %   MCV 94.7 80 - 97 fL   MCH, POC 31.0 27 - 31.2 pg   MCHC 32.7 31.8 - 35.4 g/dL   RDW, POC 91.413.4 %   Platelet Count, POC 240 142 - 424 K/uL   MPV 7.5 0 - 99.8 fL  POCT rapid strep A  Result Value Ref Range   Rapid Strep A Screen Negative Negative   [facility] reading (PRIMARY) by  Dr. Cleta Albertsaub there are mild arthritic changes and minimal disc disease. There is no malalignment.       Assessment & Plan:  Patient treated with Mobic and Flexeril. Throat culture was done and pending. Throat culture subsequently returned positive and I called in Cleocin 300 mg 3 times a day for 10 days

## 2014-02-25 LAB — CULTURE, GROUP A STREP

## 2014-02-26 ENCOUNTER — Other Ambulatory Visit: Payer: Self-pay | Admitting: Emergency Medicine

## 2014-02-26 MED ORDER — CLINDAMYCIN HCL 300 MG PO CAPS: 300.0000 mg | ORAL_CAPSULE | Freq: Three times a day (TID) | ORAL | Status: DC

## 2014-09-17 ENCOUNTER — Ambulatory Visit (INDEPENDENT_AMBULATORY_CARE_PROVIDER_SITE_OTHER): Payer: 59 | Admitting: Emergency Medicine

## 2014-09-17 ENCOUNTER — Ambulatory Visit (INDEPENDENT_AMBULATORY_CARE_PROVIDER_SITE_OTHER): Payer: 59

## 2014-09-17 VITALS — BP 122/80 | HR 80 | Temp 98.3°F | Resp 16 | Ht 69.0 in | Wt 234.2 lb

## 2014-09-17 DIAGNOSIS — J302 Other seasonal allergic rhinitis: Secondary | ICD-10-CM | POA: Insufficient documentation

## 2014-09-17 DIAGNOSIS — J209 Acute bronchitis, unspecified: Secondary | ICD-10-CM | POA: Diagnosis not present

## 2014-09-17 DIAGNOSIS — R062 Wheezing: Secondary | ICD-10-CM

## 2014-09-17 DIAGNOSIS — R05 Cough: Secondary | ICD-10-CM

## 2014-09-17 DIAGNOSIS — R0602 Shortness of breath: Secondary | ICD-10-CM

## 2014-09-17 DIAGNOSIS — R059 Cough, unspecified: Secondary | ICD-10-CM

## 2014-09-17 MED ORDER — LEVOCETIRIZINE DIHYDROCHLORIDE 5 MG PO TABS: 5.0000 mg | ORAL_TABLET | Freq: Every evening | ORAL | Status: DC

## 2014-09-17 MED ORDER — AZITHROMYCIN 250 MG PO TABS: ORAL_TABLET | ORAL | Status: DC

## 2014-09-17 MED ORDER — HYDROCODONE-HOMATROPINE 5-1.5 MG/5ML PO SYRP: 5.0000 mL | ORAL_SOLUTION | Freq: Every evening | ORAL | Status: DC | PRN

## 2014-09-17 NOTE — Patient Instructions (Signed)

## 2014-09-17 NOTE — Progress Notes (Signed)
MRN: [id number] DOB: October 03, 1955  Subjective:   Kathryn Pineda is a 59 y.o. female presenting for chief complaint of Cough; Nasal Congestion; and Wheezing  Reports 1 week history of worsening productive cough without hemoptysis. Cough is worse in the morning, elicits shob and wheezing. She has also had nasal congestion, sinus pressure, now having frontal sinus pain in the past couple of days; slight amount of clear bilateral ear drainage, gum discomfort. Patient thought it was allergies, has tried Benadryl x1, multiple DayQuil and Sudafed with some relief but not resolution. Of note, patient has allergies to cats, has a cat at home. She used to take Claritin for her allergies but stopped this due to memory difficulty associated with Claritin. Denies fever, ear pain, eye pain, red eyes, throat pain, chest pain, nausea, vomiting, abdominal pain. Denies smoking cigarettes. Denies history of asthma. Denies any other aggravating or relieving factors, no other questions or concerns.  Kathryn Pineda has a current medication list which includes the following prescription(s): ibuprofen, loratadine, and multivitamin. She is allergic to adhesive and penicillins.  Kathryn Pineda  has a past medical history of Obesity; Migraines; Hemorrhoid; GERD (gastroesophageal reflux disease); Phlebitis of leg; Primary osteoarthritis of both knees; SOB (shortness of breath); Fatigue; Breast pain; Lump in female breast; Hiatal hernia; Arthritis; Anal fissure; Stress incontinence; Generalized headaches; Wilson's disease; Condyloma; and Biliary colic (5/96). Also  has past surgical history that includes Anal fissurectomy (1986); Laparoscopic cholecystectomy (1997); Bunionectomy (Bilateral, 2005); Endovenous ablation saphenous vein w/ laser (2008); Cesarean section (1990); and Laparoscopic gastric banding (09/16/10).  ROS As in subjective.  Objective:   Vitals: BP 122/80 mmHg  Pulse 80  Temp(Src) 98.3 F (36.8 C) (Oral)  Resp 16  Ht 5\' 9"   (1.753 m)  Wt 234 lb 3.2 oz (106.232 kg)  BMI 34.57 kg/m2  SpO2 98%  Physical Exam  Constitutional: She is oriented to person, place, and time. She appears well-developed and well-nourished.  HENT:  TM's intact bilaterally, no effusions or erythema. Nares patent, nasal turbinates pink and moist. Mild frontal sinus tenderness. Oropharynx with post-nasal drip, mucous membranes moist, no oropharyngeal erythema or abscesses, dentition in good repair.  Eyes: Conjunctivae are normal. Right eye exhibits no discharge. Left eye exhibits no discharge. No scleral icterus.  Neck: Normal range of motion. Neck supple.  Cardiovascular: Normal rate, regular rhythm and intact distal pulses.   Pulmonary/Chest: No stridor. No respiratory distress. She has wheezes (ronchirous bibasilar lung sounds). She has no rales.  Musculoskeletal: She exhibits no edema.  Lymphadenopathy:    She has no cervical adenopathy.  Neurological: She is alert and oriented to person, place, and time.  Skin: Skin is warm and dry. No rash noted. No erythema. No pallor.   [facility] reading (PRIMARY) by  Dr. Cleta Alberts and PA-Shandy Vi. Chest - prominent markings throughout, no areas of consolidation.  Dg Chest 2 View  09/17/2014   CLINICAL DATA:  Shortness of Breath  EXAM: CHEST  2 VIEW  COMPARISON:  06/26/2010  FINDINGS: Cardiomediastinal silhouette is stable. No acute infiltrate or pleural effusion. No pulmonary edema. Bony thorax is unremarkable. Stable rib calcifications in left apex.  IMPRESSION: No active cardiopulmonary disease.   Electronically Signed   By: Natasha Mead M.D.   On: 09/17/2014 11:19   Assessment and Plan :   1. Acute bronchitis, unspecified organism 2. Cough 3. Shortness of breath 4. Wheezing - Start azithromycin for infectious process, Hycodan for cough. RTC if no improvement despite antibiotic course.  5. Seasonal allergies -  Start Xyzal daily for allergies - levocetirizine (XYZAL) 5 MG tablet; Take 1 tablet (5 mg  total) by mouth every evening.  Dispense: 30 tablet; Refill: 11   Wallis Bamberg, PA-C [facility] [phone] 09/17/2014 10:30 AM

## 2014-10-26 ENCOUNTER — Ambulatory Visit: Payer: 59 | Admitting: Obstetrics & Gynecology

## 2014-11-09 ENCOUNTER — Encounter: Payer: Self-pay | Admitting: Obstetrics & Gynecology

## 2014-11-09 ENCOUNTER — Other Ambulatory Visit: Payer: Self-pay | Admitting: Obstetrics & Gynecology

## 2014-11-09 ENCOUNTER — Ambulatory Visit (INDEPENDENT_AMBULATORY_CARE_PROVIDER_SITE_OTHER): Payer: 59 | Admitting: Obstetrics & Gynecology

## 2014-11-09 VITALS — BP 118/78 | HR 72 | Resp 20 | Ht 68.5 in | Wt 235.0 lb

## 2014-11-09 DIAGNOSIS — N8110 Cystocele, unspecified: Secondary | ICD-10-CM

## 2014-11-09 DIAGNOSIS — Z0000 Encounter for general adult medical examination without abnormal findings: Secondary | ICD-10-CM

## 2014-11-09 DIAGNOSIS — Z01419 Encounter for gynecological examination (general) (routine) without abnormal findings: Secondary | ICD-10-CM | POA: Diagnosis not present

## 2014-11-09 DIAGNOSIS — IMO0002 Reserved for concepts with insufficient information to code with codable children: Secondary | ICD-10-CM

## 2014-11-09 DIAGNOSIS — N816 Rectocele: Secondary | ICD-10-CM | POA: Diagnosis not present

## 2014-11-09 DIAGNOSIS — Z1231 Encounter for screening mammogram for malignant neoplasm of breast: Secondary | ICD-10-CM

## 2014-11-09 LAB — COMPREHENSIVE METABOLIC PANEL
ALBUMIN: 4.3 g/dL (ref 3.6–5.1)
ALT: 14 U/L (ref 6–29)
AST: 17 U/L (ref 10–35)
Alkaline Phosphatase: 57 U/L (ref 33–130)
BILIRUBIN TOTAL: 0.6 mg/dL (ref 0.2–1.2)
BUN: 11 mg/dL (ref 7–25)
CO2: 28 mmol/L (ref 20–31)
Calcium: 9.2 mg/dL (ref 8.6–10.4)
Chloride: 106 mmol/L (ref 98–110)
Creat: 0.75 mg/dL (ref 0.50–1.05)
Glucose, Bld: 93 mg/dL (ref 65–99)
Potassium: 4.3 mmol/L (ref 3.5–5.3)
Sodium: 141 mmol/L (ref 135–146)
Total Protein: 6.7 g/dL (ref 6.1–8.1)

## 2014-11-09 LAB — CBC
HCT: 41.6 % (ref 36.0–46.0)
HEMOGLOBIN: 14 g/dL (ref 12.0–15.0)
MCH: 31.3 pg (ref 26.0–34.0)
MCHC: 33.7 g/dL (ref 30.0–36.0)
MCV: 92.9 fL (ref 78.0–100.0)
MPV: 9.2 fL (ref 8.6–12.4)
Platelets: 246 10*3/uL (ref 150–400)
RBC: 4.48 MIL/uL (ref 3.87–5.11)
RDW: 13.5 % (ref 11.5–15.5)
WBC: 4.2 10*3/uL (ref 4.0–10.5)

## 2014-11-09 LAB — POCT URINALYSIS DIPSTICK
Bilirubin, UA: NEGATIVE
Glucose, UA: NEGATIVE
Ketones, UA: NEGATIVE
Nitrite, UA: NEGATIVE
Protein, UA: NEGATIVE
UROBILINOGEN UA: NEGATIVE
pH, UA: 7

## 2014-11-09 LAB — LIPID PANEL
Cholesterol: 201 mg/dL — ABNORMAL HIGH (ref 125–200)
HDL: 75 mg/dL (ref >=46)
LDL CALC: 106 mg/dL (ref <130)
TRIGLYCERIDES: 100 mg/dL (ref <150)
Total CHOL/HDL Ratio: 2.7 Ratio (ref <=5.0)
VLDL: 20 mg/dL (ref <30)

## 2014-11-09 LAB — TSH: TSH: 1.576 u[IU]/mL (ref 0.350–4.500)

## 2014-11-09 LAB — HEMOGLOBIN, FINGERSTICK: HEMOGLOBIN, FINGERSTICK: 13.9 g/dL (ref 12.0–16.0)

## 2014-11-09 NOTE — Progress Notes (Signed)
59 y.o. [id number] MarriedCaucasianF here for annual exam.  Having some stressors with daughter who is exploring a female-female relationship.    Patient reports having some rectal bleeding.  Went to [place] last year.  Had diarrhea while she was there and took cipro bid for a week.  This helped but really has never fixed the symptoms.  Pt reports she is not back to "normal".  Pt did have colonoscopy 2012 with Dr. Ewing Schlein.   Pt's took z pak for bronchitis and this started the issues all over again.    No LMP recorded. Patient is postmenopausal.          Sexually active: Yes.    The current method of family planning is vasectomy.    Exercising: No.  not currently Smoker:  no  Health Maintenance: Pap:  09/18/13 WNL, 2/13 neg pap with neg HR HPV History of abnormal Pap:  no MMG:  09/19/13 3D BiRads 1-negative Colonoscopy:  2012 Dr Ewing Schlein, having some more frequent rectal bleeding BMD:   none TDaP:  2012 Screening Labs: today, Hb today: 13.9, Urine today:  WBC-2+, RBC-trace, PH-7.0   reports that she has never smoked. She has never used smokeless tobacco. She reports that she drinks alcohol. She reports that she does not use illicit drugs.  Past Medical History  Diagnosis Date  . Obesity   . Migraines   . Hemorrhoid   . GERD (gastroesophageal reflux disease)   . Phlebitis of leg   . Primary osteoarthritis of both knees   . SOB (shortness of breath)   . Fatigue     loss of sleep  . Breast pain   . Lump in female breast   . Hiatal hernia   . Arthritis   . Anal fissure   . Stress incontinence        . Generalized headaches   . Wilson's disease   . Condyloma   . Biliary colic 5/96    ERCP w/spincterotomy  . Bronchitis     Past Surgical History  Procedure Laterality Date  . Anal fissurectomy  1986  . Laparoscopic cholecystectomy  1997  . Bunionectomy Bilateral 2005  . Endovenous ablation saphenous vein w/ laser  2008    both lower legs  . Cesarean section  1990    prolapsed cord   . Laparoscopic gastric banding  09/16/10    w/hernia repair    Current Outpatient Prescriptions  Medication Sig Dispense Refill  . ibuprofen (ADVIL,MOTRIN) 200 MG tablet Take 200 mg by mouth every 6 (six) hours as needed.    Marland Kitchen levocetirizine (XYZAL) 5 MG tablet Take 1 tablet (5 mg total) by mouth every evening. 30 tablet 11  . Multiple Vitamin (MULTIVITAMIN) tablet Take 1 tablet by mouth daily.      No current facility-administered medications for this visit.    Family History  Problem Relation Age of Onset  . Diabetes Father   . Colon polyps Father   . Osteoarthritis Mother   . Hypertension Brother   . Cancer Maternal Uncle     leukemia  . Cancer Maternal Grandmother     unaware    ROS:  Pertinent items are noted in HPI.  Otherwise, a comprehensive ROS was negative.  Exam:   BP:  118/78, P: 72,  R: 20, Wt: 235#, Ht: 5' 8.5"  General appearance: alert, cooperative and appears stated age Head: Normocephalic, without obvious abnormality, atraumatic Neck: no adenopathy, supple, symmetrical, trachea midline and thyroid normal to inspection and palpation  Lungs: clear to auscultation bilaterally Breasts: normal appearance, no masses or tenderness Heart: regular rate and rhythm Abdomen: soft, non-tender; bowel sounds normal; no masses,  no organomegaly Extremities: extremities normal, atraumatic, no cyanosis or edema Skin: Skin color, texture, turgor normal. No rashes or lesions Lymph nodes: Cervical, supraclavicular, and axillary nodes normal. No abnormal inguinal nodes palpated Neurologic: Grossly normal   Pelvic: External genitalia:  no lesions              Urethra:  normal appearing urethra with no masses, tenderness or lesions              Bartholins and Skenes: normal                 Vagina: normal appearing vagina with normal color and discharge, no lesions, 3rd degree cystocele and rectocele with valsalva.  Grade 2 uterine prolapse              Cervix: no lesions               Pap taken: No. Bimanual Exam:  Uterus:  normal size, contour, position, consistency, mobility, non-tender              Adnexa: normal adnexa and no mass, fullness, tenderness               Rectovaginal: Confirms               Anus:  normal sphincter tone, no lesions  Chaperone was present for exam.  A: Well Woman with normal exam  PMP, no HRT  Obesity  Cystocele and rectocele with incomplete vaginal prolapse Recent rectal bleeding after diarrhea episode  P: Mammogram yearly.  pap smear with neg HR HPV 2/13. Pap 2015.  No pap today Labs:  CBC, CMP, TSH, Vit D, Lipids Pt is going to follow up with Dr. Ewing Schlein.  Asked pt regarding scheduling and she will schedule this herself.  After evaluation, have encouraged pt to return for follow up to continue discussing possible surgical correction of pelvic relaxation. return annually or prn

## 2014-11-10 LAB — VITAMIN D 25 HYDROXY (VIT D DEFICIENCY, FRACTURES): VIT D 25 HYDROXY: 18 ng/mL — AB (ref 30–100)

## 2014-11-12 ENCOUNTER — Telehealth: Payer: Self-pay

## 2014-11-12 DIAGNOSIS — E559 Vitamin D deficiency, unspecified: Secondary | ICD-10-CM

## 2014-11-12 MED ORDER — VITAMIN D (ERGOCALCIFEROL) 1.25 MG (50000 UNIT) PO CAPS: 50000.0000 [IU] | ORAL_CAPSULE | ORAL | Status: DC

## 2014-11-12 NOTE — Telephone Encounter (Signed)
-----   Message from [name], MD sent at 11/11/2014 10:49 PM EDT ----- Please inform Vit D is low at 18.  Would recommend 50K weekly Vit D and repeat level 3 months.  No orders placed yet.  Pt may decline this.  CBC, CMP, TSH all normal.  Lipids good.  LDL 106.  HDL 75.  TG 100.  No treatment recommendations other than Vit D.

## 2014-11-12 NOTE — Telephone Encounter (Signed)
Patient notified of all results. Rx for Vitamin D 50,000IU weekly sent to pharmacy. Recheck level scheduled for 02/05/15. Patient also informed of MMG appointment on 12/13/14 at 2:40, [facility].//kn

## 2014-12-13 ENCOUNTER — Ambulatory Visit
Admission: RE | Admit: 2014-12-13 | Discharge: 2014-12-13 | Disposition: A | Discharge status: Home / Self Care | Payer: 59 | Source: Ambulatory Visit | Attending: Obstetrics & Gynecology | Admitting: Obstetrics & Gynecology

## 2014-12-13 DIAGNOSIS — Z1231 Encounter for screening mammogram for malignant neoplasm of breast: Secondary | ICD-10-CM

## 2015-01-31 IMAGING — US US SOFT TISSUE HEAD/NECK
1 series · 14 of 25 positions shown · non-contrast
Comparison: None.

CLINICAL DATA: Thyromegaly

THYROID ULTRASOUND
TECHNIQUE: Ultrasound examination of the thyroid gland and adjacent
soft tissues was performed.

[Series 1: us soft tissue head/neck · 0.07mm/px · 14 of 27 slices shown]
[im 1/27]
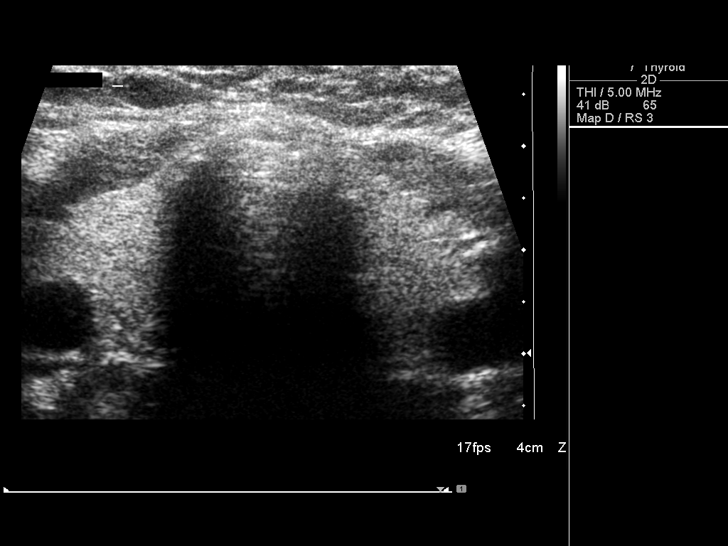
[im 3/27]
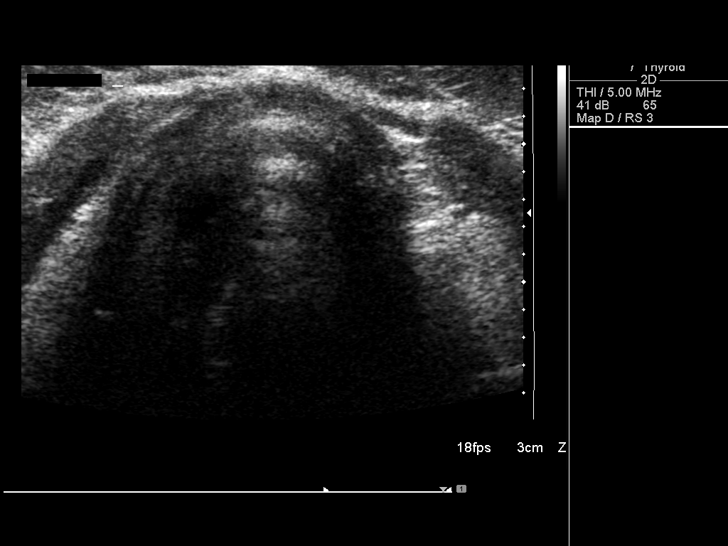
[im 5/27]
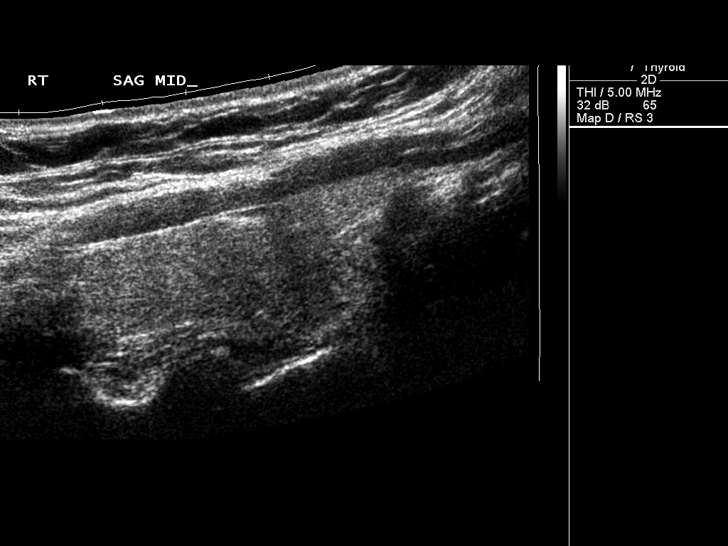
[im 7/27]
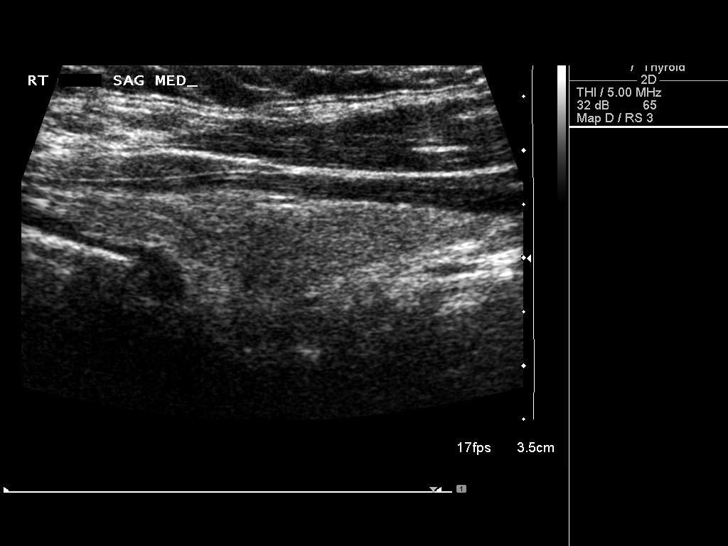
[im 9/27]
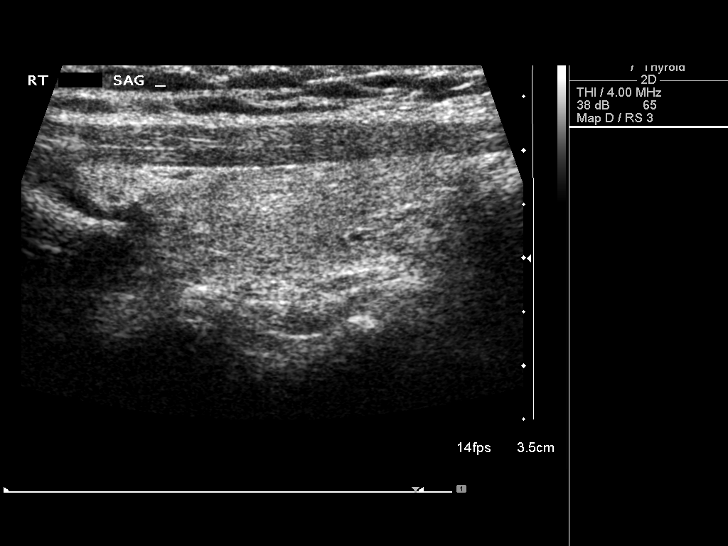
[im 10/27]
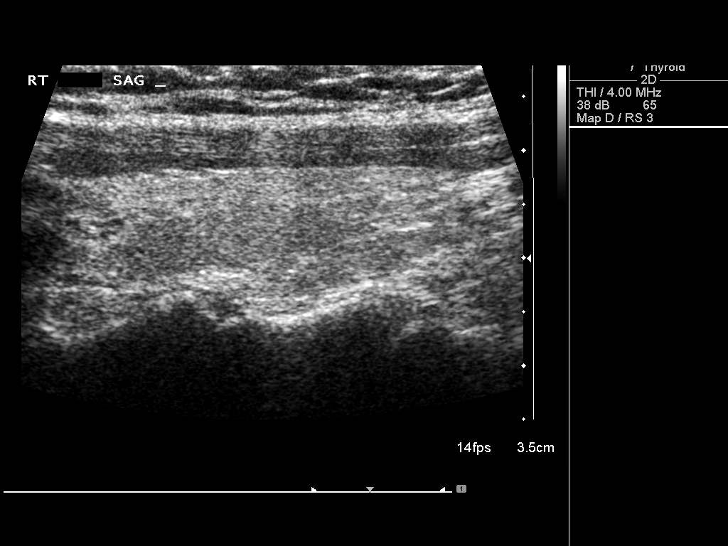
[im 12/27]
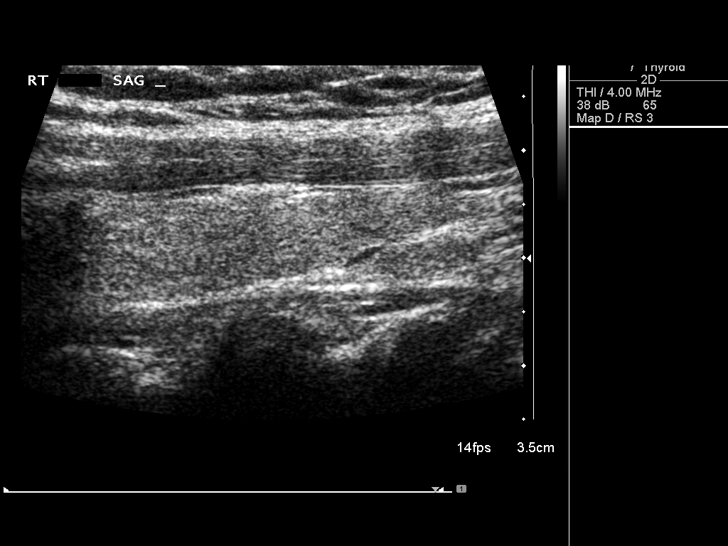
[im 15/27]
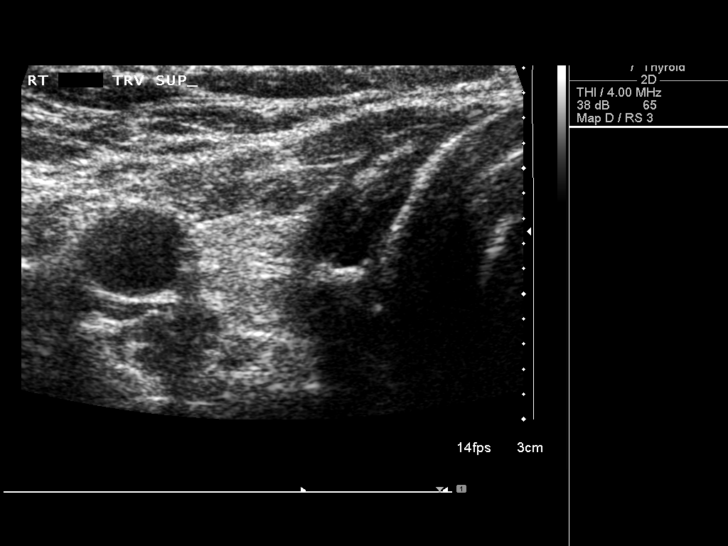
[im 17/27]
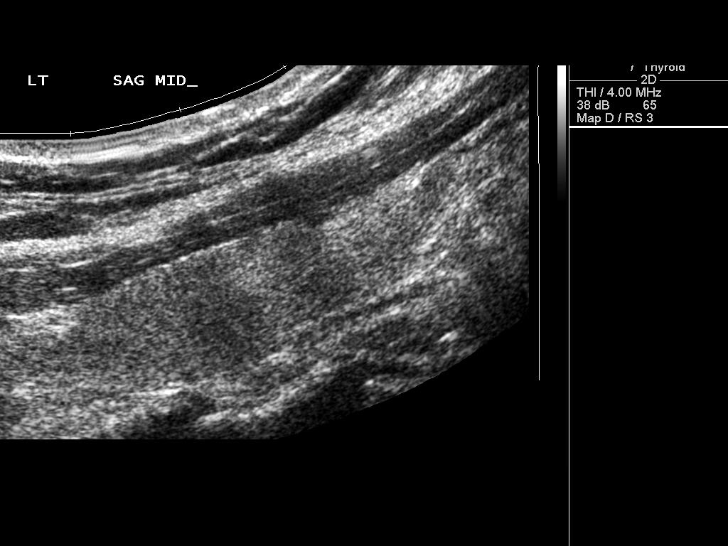
[im 18/27]
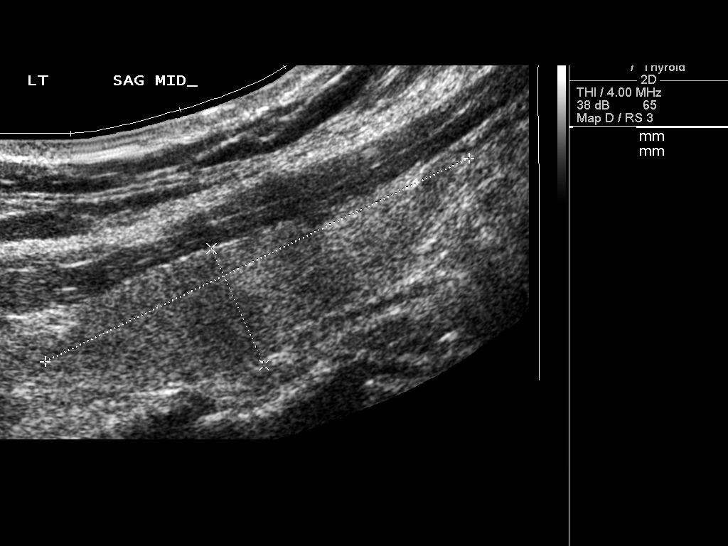
[im 20/27]
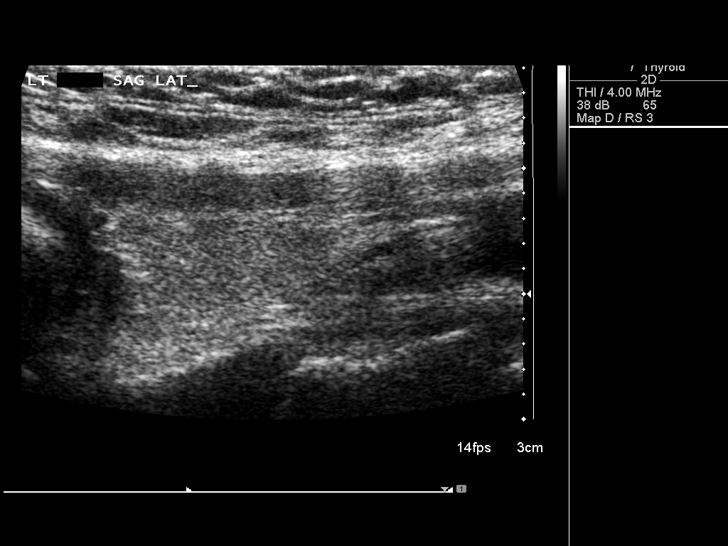
[im 22/27]
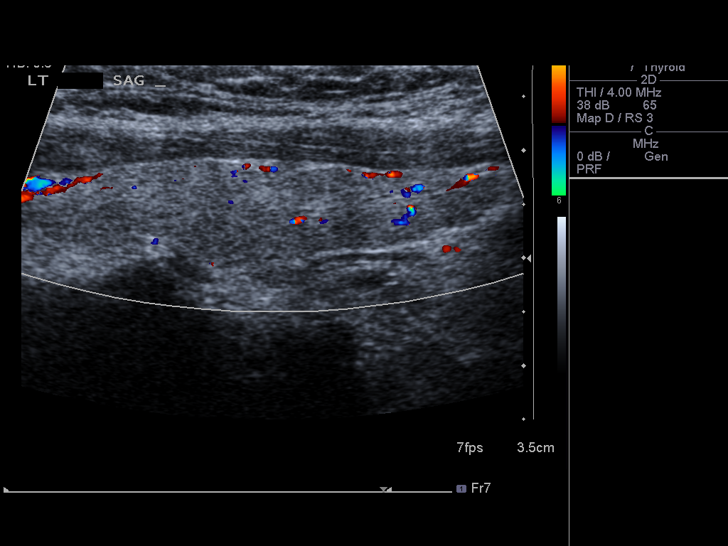
[im 24/27]
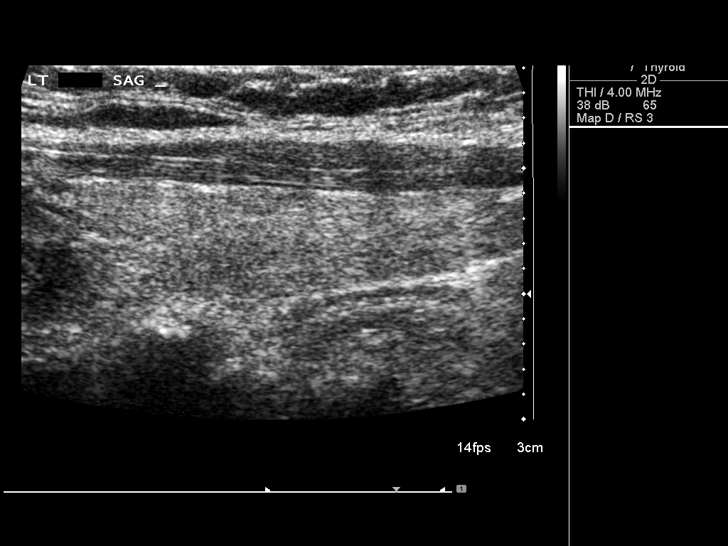
[im 27/27]
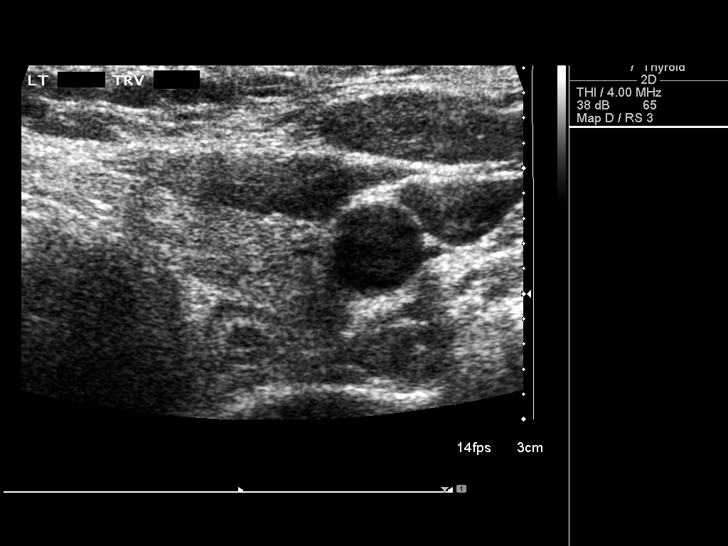

[14 of 25 positions shown; findings below may reference images not displayed]

FINDINGS: Right thyroid lobe:  Normal in size and echogenicity measuring
cm x 1.6 cm x 1.6 cm
Left thyroid lobe:  Normal in size and echogenicity measuring
cm x 1.1 cm x 1.3 cm in
Isthmus:  Normal in thickness and homogeneous measuring 2 mm

Focal nodules:  No nodules

Lymphadenopathy:  None visualized.
IMPRESSION: Normal thyroid ultrasound.

## 2015-02-05 ENCOUNTER — Other Ambulatory Visit (INDEPENDENT_AMBULATORY_CARE_PROVIDER_SITE_OTHER): Payer: 59

## 2015-02-05 DIAGNOSIS — E559 Vitamin D deficiency, unspecified: Secondary | ICD-10-CM

## 2015-02-06 LAB — VITAMIN D 25 HYDROXY (VIT D DEFICIENCY, FRACTURES): VIT D 25 HYDROXY: 26 ng/mL — AB (ref 30–100)

## 2015-02-08 MED ORDER — VITAMIN D (ERGOCALCIFEROL) 1.25 MG (50000 UNIT) PO CAPS: 50000.0000 [IU] | ORAL_CAPSULE | ORAL | Status: DC

## 2015-02-21 MED FILL — LEVOCETIRIZINE 5 MG TABLET: 5 | 30 days supply | Qty: 30 | Fill #4

## 2015-03-07 MED FILL — PREPOPIK POWDER PACKET: 10-3.5-12 | 1 days supply | Qty: 2 | Fill #0

## 2015-03-11 DIAGNOSIS — K921 Melena: Secondary | ICD-10-CM | POA: Diagnosis not present

## 2015-03-11 DIAGNOSIS — Z800 Family history of malignant neoplasm of digestive organs: Secondary | ICD-10-CM | POA: Diagnosis not present

## 2015-03-11 DIAGNOSIS — Z8371 Family history of colonic polyps: Secondary | ICD-10-CM | POA: Diagnosis not present

## 2015-03-25 MED FILL — LEVOCETIRIZINE 5 MG TABLET: 5 | 30 days supply | Qty: 30 | Fill #5

## 2015-04-30 MED FILL — LEVOCETIRIZINE 5 MG TABLET: 5 | 30 days supply | Qty: 30 | Fill #6

## 2015-04-30 MED FILL — VIT D2 1.25 MG (50,000 UNIT: 1.25 MG | 84 days supply | Qty: 12 | Fill #1

## 2015-05-31 MED FILL — LEVOCETIRIZINE 5 MG TABLET: 5 | 30 days supply | Qty: 30 | Fill #7

## 2015-07-01 MED FILL — LEVOCETIRIZINE 5 MG TABLET: 5 | 30 days supply | Qty: 30 | Fill #8

## 2015-08-13 MED FILL — LEVOCETIRIZINE 5 MG TABLET: 5 | 30 days supply | Qty: 30 | Fill #9

## 2015-11-06 DIAGNOSIS — H5213 Myopia, bilateral: Secondary | ICD-10-CM | POA: Diagnosis not present

## 2015-11-08 DIAGNOSIS — M7061 Trochanteric bursitis, right hip: Secondary | ICD-10-CM | POA: Diagnosis not present

## 2015-11-22 DIAGNOSIS — M25551 Pain in right hip: Secondary | ICD-10-CM | POA: Diagnosis not present

## 2015-12-03 DIAGNOSIS — M25551 Pain in right hip: Secondary | ICD-10-CM | POA: Diagnosis not present

## 2015-12-12 DIAGNOSIS — M25551 Pain in right hip: Secondary | ICD-10-CM | POA: Diagnosis not present

## 2015-12-13 DIAGNOSIS — M7061 Trochanteric bursitis, right hip: Secondary | ICD-10-CM | POA: Diagnosis not present

## 2015-12-31 ENCOUNTER — Other Ambulatory Visit: Payer: Self-pay | Admitting: Obstetrics & Gynecology

## 2015-12-31 DIAGNOSIS — Z1231 Encounter for screening mammogram for malignant neoplasm of breast: Secondary | ICD-10-CM

## 2016-01-06 ENCOUNTER — Encounter (HOSPITAL_COMMUNITY): Payer: Self-pay

## 2016-02-04 ENCOUNTER — Ambulatory Visit
Admission: RE | Admit: 2016-02-04 | Discharge: 2016-02-04 | Disposition: A | Discharge status: Home / Self Care | Payer: 59 | Source: Ambulatory Visit | Attending: Obstetrics & Gynecology | Admitting: Obstetrics & Gynecology

## 2016-02-04 DIAGNOSIS — Z1231 Encounter for screening mammogram for malignant neoplasm of breast: Secondary | ICD-10-CM | POA: Diagnosis not present

## 2016-03-06 ENCOUNTER — Ambulatory Visit (INDEPENDENT_AMBULATORY_CARE_PROVIDER_SITE_OTHER): Payer: 59 | Admitting: Obstetrics & Gynecology

## 2016-03-06 ENCOUNTER — Other Ambulatory Visit: Payer: Self-pay | Admitting: Obstetrics & Gynecology

## 2016-03-06 ENCOUNTER — Encounter: Payer: Self-pay | Admitting: Obstetrics & Gynecology

## 2016-03-06 VITALS — BP 112/70 | HR 82 | Resp 16 | Ht 68.25 in | Wt 242.0 lb

## 2016-03-06 DIAGNOSIS — N812 Incomplete uterovaginal prolapse: Secondary | ICD-10-CM | POA: Diagnosis not present

## 2016-03-06 DIAGNOSIS — Z01419 Encounter for gynecological examination (general) (routine) without abnormal findings: Secondary | ICD-10-CM | POA: Diagnosis not present

## 2016-03-06 DIAGNOSIS — N816 Rectocele: Secondary | ICD-10-CM

## 2016-03-06 DIAGNOSIS — N39498 Other specified urinary incontinence: Secondary | ICD-10-CM | POA: Diagnosis not present

## 2016-03-06 DIAGNOSIS — E559 Vitamin D deficiency, unspecified: Secondary | ICD-10-CM | POA: Diagnosis not present

## 2016-03-06 DIAGNOSIS — Z205 Contact with and (suspected) exposure to viral hepatitis: Secondary | ICD-10-CM

## 2016-03-06 DIAGNOSIS — Z1151 Encounter for screening for human papillomavirus (HPV): Secondary | ICD-10-CM | POA: Diagnosis not present

## 2016-03-06 DIAGNOSIS — Z0000 Encounter for general adult medical examination without abnormal findings: Secondary | ICD-10-CM | POA: Diagnosis not present

## 2016-03-06 DIAGNOSIS — Z124 Encounter for screening for malignant neoplasm of cervix: Secondary | ICD-10-CM | POA: Diagnosis not present

## 2016-03-06 LAB — POCT URINALYSIS DIPSTICK
Bilirubin, UA: NEGATIVE
GLUCOSE UA: NEGATIVE
KETONES UA: NEGATIVE
Leukocytes, UA: NEGATIVE
Nitrite, UA: NEGATIVE
Protein, UA: NEGATIVE
RBC UA: NEGATIVE
Urobilinogen, UA: NEGATIVE
pH, UA: 5

## 2016-03-06 LAB — LIPID PANEL
CHOL/HDL RATIO: 2.5 ratio (ref <5.0)
Cholesterol: 189 mg/dL (ref <200)
HDL: 75 mg/dL (ref >50)
LDL CALC: 101 mg/dL — AB (ref <100)
TRIGLYCERIDES: 65 mg/dL (ref <150)
VLDL: 13 mg/dL (ref <30)

## 2016-03-06 LAB — COMPREHENSIVE METABOLIC PANEL
ALT: 17 U/L (ref 6–29)
AST: 26 U/L (ref 10–35)
Albumin: 4.2 g/dL (ref 3.6–5.1)
Alkaline Phosphatase: 46 U/L (ref 33–130)
BILIRUBIN TOTAL: 0.4 mg/dL (ref 0.2–1.2)
BUN: 11 mg/dL (ref 7–25)
CALCIUM: 9.7 mg/dL (ref 8.6–10.4)
CHLORIDE: 107 mmol/L (ref 98–110)
CO2: 23 mmol/L (ref 20–31)
Creat: 0.77 mg/dL (ref 0.50–1.05)
GLUCOSE: 106 mg/dL — AB (ref 65–99)
Potassium: 4.3 mmol/L (ref 3.5–5.3)
Sodium: 143 mmol/L (ref 135–146)
Total Protein: 6.4 g/dL (ref 6.1–8.1)

## 2016-03-06 LAB — CBC
HEMATOCRIT: 39.1 % (ref 35.0–45.0)
Hemoglobin: 12.8 g/dL (ref 11.7–15.5)
MCH: 30.8 pg (ref 27.0–33.0)
MCHC: 32.7 g/dL (ref 32.0–36.0)
MCV: 94 fL (ref 80.0–100.0)
MPV: 9.3 fL (ref 7.5–12.5)
Platelets: 268 10*3/uL (ref 140–400)
RBC: 4.16 MIL/uL (ref 3.80–5.10)
RDW: 13.9 % (ref 11.0–15.0)
WBC: 5.2 10*3/uL (ref 3.8–10.8)

## 2016-03-06 LAB — TSH: TSH: 1.25 m[IU]/L

## 2016-03-06 LAB — HEMOGLOBIN, FINGERSTICK: Hemoglobin, fingerstick: 13 g/dL (ref 12.0–15.0)

## 2016-03-06 NOTE — Progress Notes (Signed)
61 y.o. [id number] MarriedCaucasianF here for annual exam.  Doing well.  No vaginal bleeding.  Doing well.  Working on a diet and exercise program.    H/O rectocele and cystocele--grade 3 rectocele when it is severe.  She also has associated uterine prolapse.  Pt reports she has urinary urgency first thing in the morning.  She does not typically have urinary leakage.  H/o anal fissure, treated surgically, so she does have some fecal incontinence.  Bulking agents do help.  H/O two vaginal deliveris of children >10 pounds.  We have discussed hysterectomy with repair.  She would like referral for this at this time.  No LMP recorded. Patient is postmenopausal.          Sexually active: Yes.    The current method of family planning is post menopausal status.    Exercising: Yes.    aerobics, weights, stretching every other day Smoker:  no  Health Maintenance: Pap:  09/18/13 Neg. 03/2011 Neg. HR HPV:neg History of abnormal Pap:  no MMG:  02/05/16 BIRADS1:neg  Colonoscopy:  2017 Dr. Ewing Schlein, negative per pt BMD:   Never TDaP:  2012 Pneumonia vaccine(s):  No Zostavax:   No Hep C testing: done today Screening Labs: Here, Hb today: 13.0, Urine today: Negative   reports that she has never smoked. She has never used smokeless tobacco. She reports that she drinks alcohol. She reports that she does not use drugs.  Past Medical History:  Diagnosis Date  . Anal fissure   . Arthritis   . Biliary colic 5/96   ERCP w/spincterotomy  . Breast pain   . Bronchitis   . Condyloma   . Fatigue    loss of sleep  . Generalized headaches   . GERD (gastroesophageal reflux disease)   . Hemorrhoid   . Hiatal hernia   . Lump in female breast   . Migraines   . Obesity   . Phlebitis of leg   . Primary osteoarthritis of both knees   . SOB (shortness of breath)   . Stress incontinence       . Wilson's disease     Past Surgical History:  Procedure Laterality Date  . ANAL FISSURECTOMY  1986  . BUNIONECTOMY  Bilateral 2005  . CESAREAN SECTION  1990   prolapsed cord  . ENDOVENOUS ABLATION SAPHENOUS VEIN W/ LASER  2008   both lower legs  . LAPAROSCOPIC CHOLECYSTECTOMY  1997  . LAPAROSCOPIC GASTRIC BANDING  09/16/10   w/hernia repair    Current Outpatient Prescriptions  Medication Sig Dispense Refill  . ibuprofen (ADVIL,MOTRIN) 200 MG tablet Take 200 mg by mouth every 6 (six) hours as needed.    . Multiple Vitamin (MULTIVITAMIN) tablet Take 1 tablet by mouth daily.     . Omega-3 Fatty Acids (FISH OIL) 1000 MG CAPS Take by mouth daily.    . Probiotic Product (PROBIOTIC-10) CAPS Take by mouth daily.     No current facility-administered medications for this visit.     Family History  Problem Relation Age of Onset  . Diabetes Father   . Colon polyps Father   . Osteoarthritis Mother   . Hypertension Brother   . Cancer Maternal Uncle     leukemia  . Cancer Maternal Grandmother     unaware    ROS:  Pertinent items are noted in HPI.  Otherwise, a comprehensive ROS was negative.  Exam:   BP 112/70 (BP Location: Right Arm, Patient Position: Sitting, Cuff Size: Large)  Pulse 82   Resp 16   Ht 5' 8.25" (1.734 m)   Wt 242 lb (109.8 kg)   BMI 36.53 kg/m   Weight change: +5#  Height: 5' 8.25" (173.4 cm)  Ht Readings from Last 3 Encounters:  03/06/16 5' 8.25" (1.734 m)  11/09/14 5' 8.5" (1.74 m)  09/17/14 5\' 9"  (1.753 m)    General appearance: alert, cooperative and appears stated age Head: Normocephalic, without obvious abnormality, atraumatic Neck: no adenopathy, supple, symmetrical, trachea midline and thyroid normal to inspection and palpation Lungs: clear to auscultation bilaterally Breasts: normal appearance, no masses or tenderness Heart: regular rate and rhythm Abdomen: soft, non-tender; bowel sounds normal; no masses,  no organomegaly Extremities: extremities normal, atraumatic, no cyanosis or edema Skin: Skin color, texture, turgor normal. No rashes or lesions Lymph nodes:  Cervical, supraclavicular, and axillary nodes normal. No abnormal inguinal nodes palpated Neurologic: Grossly normal   Pelvic: External genitalia:  no lesions              Urethra:  normal appearing urethra with no masses, tenderness or lesions              Bartholins and Skenes: normal                 Vagina: normal appearing vagina with normal color and discharge, no lesions              Cervix: no lesions              Pap taken: Yes.   Bimanual Exam:  Uterus:  normal size, contour, position, consistency, mobility, non-tender              Adnexa: normal adnexa and no mass, fullness, tenderness               Rectovaginal: Confirms               Anus:  normal sphincter tone, no lesions  Chaperone was present for exam.  A:  Well Woman with normal exam  PMP, no HRT  Obesity  Cystocele and rectocele with incomplete vaginal prolapse. Referral made to Dr. Sherron MondayMacDiarmid  P:  Mammogram yearly.  Pap and HR HPV obtained today CBC, CMP, TSH, Vit D, Lipids, and Hep C antibody obtained today Names for PCPs given--pt would like new person within [place] Return annually or prn  ~15  minutes spent with patient >50% of time regarding prolapse, referral and surgical repair, in addition to her AEX.

## 2016-03-06 NOTE — Patient Instructions (Signed)
[facility] [address] . [place] , [place]  Ph [phone] . Fax [phone] . [phone]

## 2016-03-07 LAB — HEPATITIS C ANTIBODY: HCV Ab: NEGATIVE

## 2016-03-07 LAB — VITAMIN D 25 HYDROXY (VIT D DEFICIENCY, FRACTURES): Vit D, 25-Hydroxy: 19 ng/mL — ABNORMAL LOW (ref 30–100)

## 2016-03-08 NOTE — Addendum Note (Signed)
Addended by: Jerene BearsMILLER, Gentry Pilson S on: 03/08/2016 06:49 AM   Modules accepted: Orders

## 2016-03-09 ENCOUNTER — Telehealth: Payer: Self-pay | Admitting: Obstetrics & Gynecology

## 2016-03-09 ENCOUNTER — Telehealth: Payer: Self-pay | Admitting: *Deleted

## 2016-03-09 LAB — HEMOGLOBIN A1C
HEMOGLOBIN A1C: 5.3 % (ref <5.7)
Mean Plasma Glucose: 105 mg/dL

## 2016-03-09 LAB — IPS PAP TEST WITH HPV

## 2016-03-09 MED ORDER — VITAMIN D (ERGOCALCIFEROL) 1.25 MG (50000 UNIT) PO CAPS: 50000.0000 [IU] | ORAL_CAPSULE | ORAL | 0 refills | Status: DC

## 2016-03-09 MED FILL — VIT D2 1.25 MG (50,000 UNIT: 1.25 MG | 84 days supply | Qty: 12 | Fill #0

## 2016-03-09 NOTE — Telephone Encounter (Signed)
Call to patient. Patient notified of all lab results and verbalized understanding. Aware she will be notified once pap and HgbA1C results are back. Pharmacy on file verified, and prescription for vitamin d called in for patient. Instructions on use reviewed with patient and she verbalized understanding. Patient declined scheduling 3 month lab recheck at this time due to being at work. Advised patient she could call at her conveience to schedule. Patient also asking about Urology referral- RN advised referral is in place and our referral coordinator, Kriste BasqueBecky would be in touch with patient once referral was processed. Patient agreeable.    Routing to provider for final review. Patient agreeable to disposition. Will close encounter.     Cc. Lilyan GilfordBecky Frahm

## 2016-03-09 NOTE — Telephone Encounter (Signed)
-----   Message from Jerene BearsMary S Miller, MD sent at 03/08/2016  6:48 AM EST ----- Please call pt and inform her Vit D was 19.  She really does need to be on some supplement.  Would recommend 50 K weekly for 12 weeks and then follow up Vit D level is needed.  Order for lab has been placed but not for rx.  Hep C is negative.  TSH is normal.  Metabolic panel was normal except glucose was mildly elevated.  I've added on a hbA1c and that is still pending.  Cholesterol was normal.  CBC was normal.  Pap is still pending.

## 2016-03-25 ENCOUNTER — Encounter: Payer: Self-pay | Admitting: Obstetrics & Gynecology

## 2016-04-24 DIAGNOSIS — N8111 Cystocele, midline: Secondary | ICD-10-CM | POA: Diagnosis not present

## 2016-04-24 DIAGNOSIS — N3946 Mixed incontinence: Secondary | ICD-10-CM | POA: Diagnosis not present

## 2016-04-24 DIAGNOSIS — N816 Rectocele: Secondary | ICD-10-CM | POA: Diagnosis not present

## 2016-04-24 DIAGNOSIS — R351 Nocturia: Secondary | ICD-10-CM | POA: Diagnosis not present

## 2016-06-05 DIAGNOSIS — N3946 Mixed incontinence: Secondary | ICD-10-CM | POA: Diagnosis not present

## 2016-07-06 ENCOUNTER — Other Ambulatory Visit: Payer: Self-pay | Admitting: Obstetrics & Gynecology

## 2016-07-06 ENCOUNTER — Encounter: Payer: Self-pay | Admitting: Obstetrics & Gynecology

## 2016-07-07 ENCOUNTER — Telehealth: Payer: Self-pay | Admitting: *Deleted

## 2016-07-07 NOTE — Telephone Encounter (Signed)
Patient sent My Chart message regarding surgery scheduling for September. Call to patient. Per ROI can leave message on home and cell numbers. Left message calling to assist with scheduling. [facility] and [facility].

## 2016-07-16 ENCOUNTER — Other Ambulatory Visit (INDEPENDENT_AMBULATORY_CARE_PROVIDER_SITE_OTHER): Payer: 59

## 2016-07-16 DIAGNOSIS — E559 Vitamin D deficiency, unspecified: Secondary | ICD-10-CM | POA: Diagnosis not present

## 2016-07-16 NOTE — Telephone Encounter (Signed)
Patient was in office for lab appointment today but I was unable to meet with her to discuss surgery. Call to patient to follow-up regarding surgery scheduling. Left message to call back.   Patient had annual exam January 2018. Do you need to see her before scheduling surgery? Please advise on posting.

## 2016-07-17 LAB — VITAMIN D 25 HYDROXY (VIT D DEFICIENCY, FRACTURES): Vit D, 25-Hydroxy: 28 ng/mL — ABNORMAL LOW (ref 30–100)

## 2016-07-17 NOTE — Telephone Encounter (Signed)
Call from patient. Requests to proceed with scheduling surgery for hysterectomy and A&P repair combined with Dr Sherron MondayMacDiarmid. Requests surgery after September 9.  Advised will call Dr MacDiarmid's office to discuss available dates and call her back.

## 2016-07-22 NOTE — Telephone Encounter (Signed)
Follow-up call to patient. After speaking with Dr MacDiarmid's office, available options with them are 10-20-16 and 11-10-16 but 11-10-16 may not work for our office. 10-20-16 will not work for patient as she will be out of town.  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  Will consider options and check with employer and follow-up next week.  Routing to provider for final review. Patient agreeable to disposition. Will close encounter.

## 2016-07-23 ENCOUNTER — Telehealth: Payer: Self-pay | Admitting: *Deleted

## 2016-07-23 NOTE — Telephone Encounter (Signed)
Call to patient. Advised Dr Sherron MondayMacDiarmid and Dr Hyacinth MeekerMiller combined surgery date available on 11-24-16. Patient will check with employer and confirm and call back.   Routing to provider for final review. Patient agreeable to disposition. Will close encounter.

## 2016-08-04 ENCOUNTER — Telehealth: Payer: Self-pay | Admitting: Obstetrics & Gynecology

## 2016-08-04 NOTE — Telephone Encounter (Signed)
Patient is ready to schedule her surgery. Patient said she spoke with Kennon RoundsSally already and scheduling surgery 11/18/2016.

## 2016-08-04 NOTE — Telephone Encounter (Signed)
Patient has some questions about her vitamin d supplement and if she is to purchase this OTC.?

## 2016-08-04 NOTE — Telephone Encounter (Signed)
Spoke with patient. Patient asking if Vit D3 id OTC or RX? Advised patient she may purchase Vit D3 OTC, advised of recommendation as seen below per Dr. Hyacinth MeekerMiller. Patient verbalizes understanding and is agreeable.  Routing to provider for final review. Patient is agreeable to disposition. Will close encounter.    Notes recorded by Jerene BearsMiller, Mary S, MD on 07/19/2016 at 7:05 AM EDT Notified pt of results via mychart.   Pt note:  Mrs. Kathryn Pineda, Your Vit D is much better at 7128. It was 19. The goal is to be above 30. You can switch to taking 2000 IU of Vit D 3 daily. The typical supplementation dosage is [phone] but I think you will need more to get the number above 30 and keep it there. I will recheck this in a year. Please let me know if you have any questions.  Leda QuailSuzanne Miller

## 2016-08-12 NOTE — Telephone Encounter (Signed)
error 

## 2016-08-17 ENCOUNTER — Telehealth: Payer: Self-pay | Admitting: Obstetrics & Gynecology

## 2016-08-17 NOTE — Telephone Encounter (Signed)
Call to patient to confirm preference of 11-24-16. Patient's previous message request was for 11-17-16. Both dates had been discussed.   Call also to Pam at Dr MacDiarmid's office to confirm availability of 11-24-16.

## 2016-08-17 NOTE — Telephone Encounter (Signed)
Chart in business office for precert. See next encounter.   Routing to provider for final review. Patient agreeable to disposition. Will close encounter.

## 2016-08-17 NOTE — Telephone Encounter (Signed)
Spoke with patient regarding benefit for surgery. Patient understood and agreeable. Patient has confirmed and is ready to schedule, patient is requesting to schedule 11/24/16. Patient aware this is professional benefit only. Patient aware will be contacted by hospital for separate benefits. Forwarding to nurse supervisor for scheduling.  Routing to Billie RuddySally Yeakley, RN

## 2016-08-18 NOTE — Telephone Encounter (Signed)
Pam from Dr MacDiarmid's office called to confirm date of 11-24-16.

## 2016-08-25 NOTE — Telephone Encounter (Signed)
Left message for patient to return call to Kennon RoundsSally or Irving Burtonmily to review surgery information form and schedule pre and post op appointments.

## 2016-09-01 DIAGNOSIS — N3946 Mixed incontinence: Secondary | ICD-10-CM | POA: Diagnosis not present

## 2016-09-01 DIAGNOSIS — N8111 Cystocele, midline: Secondary | ICD-10-CM | POA: Diagnosis not present

## 2016-09-02 ENCOUNTER — Other Ambulatory Visit: Payer: Self-pay | Admitting: Urology

## 2016-09-29 ENCOUNTER — Telehealth: Payer: Self-pay | Admitting: Obstetrics & Gynecology

## 2016-09-29 NOTE — Telephone Encounter (Signed)
Patient said "I  was told to call and schedule  pre op and post op appointments for my upcoming surgery".

## 2016-09-30 NOTE — Telephone Encounter (Signed)
Call to patient. [facility] date [facility] for [facility] at [facility]. Surgery instruction sheet reviewed and printed copy will be mailed. All pre and post op appointments scheduled. Questions answered. Call back prn.   Routing to provider for final review. Patient agreeable to disposition. Will close encounter.

## 2016-09-30 NOTE — Telephone Encounter (Signed)
See next phone encounter.  Routing to provider for final review. Patient agreeable to disposition. Will close encounter   

## 2016-10-01 DIAGNOSIS — Z0289 Encounter for other administrative examinations: Secondary | ICD-10-CM

## 2016-10-02 ENCOUNTER — Other Ambulatory Visit: Payer: Self-pay | Admitting: Obstetrics & Gynecology

## 2016-10-09 DIAGNOSIS — Z9884 Bariatric surgery status: Secondary | ICD-10-CM | POA: Diagnosis not present

## 2016-10-09 DIAGNOSIS — E669 Obesity, unspecified: Secondary | ICD-10-CM | POA: Diagnosis not present

## 2016-10-26 ENCOUNTER — Telehealth: Payer: Self-pay | Admitting: Obstetrics & Gynecology

## 2016-10-26 NOTE — Telephone Encounter (Signed)
Patient would like to reschedule her pre op appointment.11/05/16. Routing to triage to help with scheduling.

## 2016-10-26 NOTE — Telephone Encounter (Signed)
Spoke with patient. Patient is scheduled to have surgery on 11/24/2016 with Dr.Miller. Requesting to move pre-op appointment due to work schedule. Appointment moved to 11/10/2016 at 3:30 pm with Dr.Miller.  Routing to provider for final review. Patient agreeable to disposition. Will close encounter.

## 2016-11-05 ENCOUNTER — Ambulatory Visit: Payer: 59 | Admitting: Obstetrics & Gynecology

## 2016-11-10 ENCOUNTER — Ambulatory Visit (INDEPENDENT_AMBULATORY_CARE_PROVIDER_SITE_OTHER): Payer: 59 | Admitting: Obstetrics & Gynecology

## 2016-11-10 ENCOUNTER — Encounter: Payer: Self-pay | Admitting: Obstetrics & Gynecology

## 2016-11-10 VITALS — BP 104/64 | HR 84 | Resp 14 | Wt 240.0 lb

## 2016-11-10 DIAGNOSIS — N816 Rectocele: Secondary | ICD-10-CM

## 2016-11-10 DIAGNOSIS — N8111 Cystocele, midline: Secondary | ICD-10-CM | POA: Diagnosis not present

## 2016-11-10 DIAGNOSIS — N812 Incomplete uterovaginal prolapse: Secondary | ICD-10-CM

## 2016-11-10 MED ORDER — OXYCODONE-ACETAMINOPHEN 5-325 MG PO TABS: 2.0000 | ORAL_TABLET | ORAL | 0 refills | Status: DC | PRN

## 2016-11-10 MED ORDER — IBUPROFEN 800 MG PO TABS: 800.0000 mg | ORAL_TABLET | Freq: Three times a day (TID) | ORAL | 0 refills | Status: DC | PRN

## 2016-11-10 NOTE — Progress Notes (Addendum)
CC:  Discuss surgery, update interval history  27 G4P4003 MarriedCaucasian female here for discussion of upcoming procedure.  Pt has not been seen since January where initially discussion occurred.  In the interim, the has seen Dr. Sherron Monday and has undergone evaluation and urodyanmics.  He has recommended she proceed with hysterectomy as well as cystocele and rectocele repair with vault prolapse repair and cystoscopy.  TLH/BSO/uterosacral ligament plication/cystoscopy is planned from my standpoint.   She's had no significant changes to medical history since last visit.  Denies hospitalizations.  She did have bronchitis but has fully recovered from this.   Procedure discussed with patient.  Hospital stay, recovery and pain management all discussed.  Risks discussed including but not limited to bleeding, 1% risk of receiving a  transfusion, infection, 3-4% risk of bowel/bladder/ureteral/vascular injury discussed as well as possible need for additional surgery if injury does occur discussed.  DVT/PE and rare risk of death discussed.  My actual complications with prior surgeries discussed.  Vaginal cuff dehiscence discussed.  Hernia formation discussed.  Positioning and incision locations discussed.  Patient aware if pathology abnormal she may need additional treatment.  All questions answered.   Ob Hx:   No LMP recorded. Patient is postmenopausal.          Sexually active: Yes.   Birth control: vasectomy Last pap: 03/06/16 negative, HR HPV negative  Last MMG: 02/04/16 BIRADS 1 negative  Tobacco: Never smoker   Past Surgical History:  Procedure Laterality Date  . ANAL FISSURECTOMY  1986  . BUNIONECTOMY Bilateral 2005  . CESAREAN SECTION  1990   prolapsed cord  . ENDOVENOUS ABLATION SAPHENOUS VEIN W/ LASER  2008   both lower legs  . LAPAROSCOPIC CHOLECYSTECTOMY  1997  . LAPAROSCOPIC GASTRIC BANDING  09/16/10   w/hernia repair    Past Medical History:  Diagnosis Date  . Anal fissure   .  Arthritis   . Biliary colic 5/96   ERCP w/spincterotomy  . Breast pain   . Bronchitis   . Condyloma   . Fatigue    loss of sleep  . Generalized headaches   . GERD (gastroesophageal reflux disease)   . Hemorrhoid   . Hiatal hernia   . Lump in female breast   . Migraines   . Obesity   . Phlebitis of leg   . Primary osteoarthritis of both knees   . SOB (shortness of breath)   . Stress incontinence       . Wilson's disease     Allergies: Adhesive [tape] and Penicillins  Current Outpatient Prescriptions  Medication Sig Dispense Refill  . Cholecalciferol (VITAMIN D PO) Take 2,000 Int'l Units by mouth daily.    Marland Kitchen ibuprofen (ADVIL,MOTRIN) 200 MG tablet Take 200 mg by mouth every 6 (six) hours as needed.    . Multiple Vitamin (MULTIVITAMIN) tablet Take 1 tablet by mouth daily.     . Omega-3 Fatty Acids (FISH OIL) 1000 MG CAPS Take by mouth daily.    . Probiotic Product (PROBIOTIC-10) CAPS Take by mouth daily.     No current facility-administered medications for this visit.     ROS: A comprehensive review of systems was negative.  Exam:    BP 104/64 (BP Location: Right Arm, Patient Position: Sitting, Cuff Size: Large)   Pulse 84   Resp 14   Wt 240 lb (108.9 kg)   BMI 36.23 kg/m   General appearance: alert and cooperative Head: Normocephalic, without obvious abnormality, atraumatic Neck: no adenopathy, supple, symmetrical,  trachea midline and thyroid not enlarged, symmetric, no tenderness/mass/nodules Lungs: clear to auscultation bilaterally Heart: regular rate and rhythm, S1, S2 normal, no murmur, click, rub or gallop Abdomen: soft, non-tender; bowel sounds normal; no masses,  no organomegaly Extremities: extremities normal, atraumatic, no cyanosis or edema Skin: Skin color, texture, turgor normal. No rashes or lesions Lymph nodes: Cervical, supraclavicular, and axillary nodes normal. no inguinal nodes palpated Neurologic: Grossly normal  Pelvic: External genitalia:   no lesions              Urethra: normal appearing urethra with no masses, tenderness or lesions              Bartholins and Skenes: normal                 Vagina: normal appearing vagina with normal color and discharge, no lesions              Cervix: normal appearance              Pap taken: No.        Bimanual Exam:  Uterus:  uterus is normal size, shape, consistency and nontender                                      Adnexa:    normal adnexa in size, nontender and no masses                                      Rectovaginal: Deferred                                      Anus:  normal sphincter tone, no lesions  A: Incomplete uterine prolapse Cystocele Rectocele H/o lap band     P:  Laparoscopic assisted vaginal hysterectomy with BSO, cystoscopy planned Rx for Motrin and Percocet given. Medications/Vitamins reviewed.  Pt knows needs to stop an aspirin products. Hysterectomy brochure given for pre and post op instructions.  ~25 minutes spent with patient >50% of time was in face to face discussion of above.

## 2016-11-13 DIAGNOSIS — N8110 Cystocele, unspecified: Secondary | ICD-10-CM | POA: Insufficient documentation

## 2016-11-13 DIAGNOSIS — N8111 Cystocele, midline: Secondary | ICD-10-CM | POA: Insufficient documentation

## 2016-11-13 DIAGNOSIS — N816 Rectocele: Secondary | ICD-10-CM | POA: Insufficient documentation

## 2016-11-13 DIAGNOSIS — N812 Incomplete uterovaginal prolapse: Secondary | ICD-10-CM | POA: Insufficient documentation

## 2016-11-17 NOTE — Patient Instructions (Signed)
Your procedure is scheduled on:  Tuesday, Oct. 9, 2018  Enter through the Main Entrance of [facility] at:  6:00 AM  Pick up the phone at the desk and dial [phone].  Call this number if you have problems the morning of surgery: [phone].  Remember: Do NOT eat food or drink after:  Midnight Monday  Take these medicines the morning of surgery with a SIP OF WATER:  None  Stop ALL herbal medications at this time  Do NOT smoke the day of surgery.  Do NOT wear jewelry (body piercing), metal hair clips/bobby pins, make-up, artifical eyelashes or nail polish. Do NOT wear lotions, powders, or perfumes.  You may wear deodorant. Do NOT shave for 48 hours prior to surgery. Do NOT bring valuables to the hospital. Contacts, dentures, or bridgework may not be worn into surgery.  Leave suitcase in car.  After surgery it may be brought to your room.  For patients admitted to the hospital, checkout time is 11:00 AM the day of discharge.  Bring a copy of your healthcare power of attorney and living will documents.   

## 2016-11-18 ENCOUNTER — Encounter (HOSPITAL_COMMUNITY): Payer: Self-pay

## 2016-11-18 ENCOUNTER — Encounter (HOSPITAL_COMMUNITY)
Admission: RE | Admit: 2016-11-18 | Discharge: 2016-11-18 | Disposition: A | Discharge status: Home / Self Care | Payer: 59 | Source: Ambulatory Visit | Attending: Obstetrics & Gynecology | Admitting: Obstetrics & Gynecology

## 2016-11-18 DIAGNOSIS — N812 Incomplete uterovaginal prolapse: Secondary | ICD-10-CM | POA: Diagnosis not present

## 2016-11-18 DIAGNOSIS — Z01812 Encounter for preprocedural laboratory examination: Secondary | ICD-10-CM | POA: Insufficient documentation

## 2016-11-18 HISTORY — DX: Nausea with vomiting, unspecified: R11.2

## 2016-11-18 HISTORY — DX: Other specified postprocedural states: Z98.890

## 2016-11-18 LAB — PROTIME-INR
INR: 0.93
PROTHROMBIN TIME: 12.4 s (ref 11.4–15.2)

## 2016-11-18 LAB — BASIC METABOLIC PANEL
ANION GAP: 8 (ref 5–15)
BUN: 12 mg/dL (ref 6–20)
CALCIUM: 9 mg/dL (ref 8.9–10.3)
CHLORIDE: 104 mmol/L (ref 101–111)
CO2: 26 mmol/L (ref 22–32)
Creatinine, Ser: 0.85 mg/dL (ref 0.44–1.00)
GFR calc non Af Amer: >60 mL/min (ref >60)
Glucose, Bld: 117 mg/dL — ABNORMAL HIGH (ref 65–99)
Potassium: 3.6 mmol/L (ref 3.5–5.1)
Sodium: 138 mmol/L (ref 135–145)

## 2016-11-18 LAB — CBC
HCT: 39.3 % (ref 36.0–46.0)
HEMOGLOBIN: 13.3 g/dL (ref 12.0–15.0)
MCH: 31.4 pg (ref 26.0–34.0)
MCHC: 33.8 g/dL (ref 30.0–36.0)
MCV: 92.7 fL (ref 78.0–100.0)
Platelets: 241 10*3/uL (ref 150–400)
RBC: 4.24 MIL/uL (ref 3.87–5.11)
RDW: 13.4 % (ref 11.5–15.5)
WBC: 6 10*3/uL (ref 4.0–10.5)

## 2016-11-18 LAB — TYPE AND SCREEN
ABO/RH(D): A POS
ANTIBODY SCREEN: NEGATIVE

## 2016-11-18 LAB — ABO/RH: ABO/RH(D): A POS

## 2016-11-18 MED FILL — IBUPROFEN 800 MG TABS: 800 | 10 days supply | Qty: 30 | Fill #0

## 2016-11-18 MED FILL — OXYCODONE-ACETAMINOPHEN 5-3: 5-325 | 5 days supply | Qty: 30 | Fill #0

## 2016-11-23 MED ORDER — METRONIDAZOLE IN NACL 5-0.79 MG/ML-% IV SOLN
500.0000 mg | Freq: Once | INTRAVENOUS | Status: AC
  Administered 2016-11-24: 500 mg via INTRAVENOUS
  Filled 2016-11-23: qty 100

## 2016-11-23 MED ORDER — GENTAMICIN SULFATE 40 MG/ML IJ SOLN
5.0000 mg/kg | INTRAVENOUS | Status: AC
  Administered 2016-11-24: 420 mg via INTRAVENOUS
  Filled 2016-11-23: qty 10.5

## 2016-11-23 NOTE — H&P (Signed)
CC/HPI: I was consulted by the above providers to assist the patient's prolapse worsening over many years. She can feel vaginal bulging. She does splinting maneuvers for voiding and bowel movements. She has had obesity surgery and tends towards looser bowel movements. She has had a fissure and actually worsened pads sometimes for some fecal soilage is mild   She sometimes leaks with coughing and sneezing but not bending or lifting. She has urgency incontinence worse in the morning. 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 She sometimes gets up once at night. She reported good flow   On pelvic examination the patient could not bear down well but she could cough well. She had a large grade 2 cystocele or small grade 3 associated with a modest central defect. Her uterus descended 3-4 cm. She had a grade 2 rectocele. She had mild hypermobility of the bladder neck and a negative cough test after cystoscopy with the cystocele reduced   Cystoscopy normal   The patient has mild mixed incontinence. She has fecal issues mentioned. She has mild nocturia. She will return for urodynamics. A picture was drawn at the patient never had surgery she would likely best benefit from a transvaginal hysterectomy with cystocele repair and graft and vault suspension and rectocele repair. She may or may not require a sling cystourethropexy   Today  Frequency and incontinence are stable  On urodynamics her bladder capacity was 820 L. Her bladder was unstable reaching a pressure of 8 cm water but she did not leak. Questionably at 400 mL she may have leaked a small amount at 107 cm or water. Otherwise at very high pressures she did not demonstrate stress incontinence. During voluntary voiding she voided 800 mL with a maximum flow of 25 mils per second and she emptied efficiently. EMG  activity was normal. Fluoroscopically her cystocele was not that impressive almost surprisingly so. The details of the urodynamics are signed and dictated.   The patient by history and physical and urodynamics has very mild incontinence. I did not demonstrate stress incontinence urodynamically. She does have a large capacity bladder and at very high pressures did not leak with the cystocele reduced. Arguably she might be at higher risk of retention. Overall I think could be very prudent not to perform a sling. Worsening incontinence and the role of delayed sling was discussed   The patient and I spoke about watchful waiting first pass reversed a transvaginal hysterectomy with cystocele repair and graftable suspension and rectocele repair   The patient is already scheduled for surgery. She understands that fecal soilage almost for certain will not improve after the prolapse surgery and not to have surgery for incontinence   ALLERGIES: Adhesive Tape Penicillin   MEDICATIONS: No Medications    GU PSH: Complex cystometrogram, w/ void pressure and urethral pressure profile studies, any technique - 06/05/2016 Complex Uroflow - 06/05/2016 Cystoscopy - 04/24/2016 Emg surf Electrd - 06/05/2016 Inject For cystogram - 06/05/2016 Intrabd voidng Press - 06/05/2016   NON-GU PSH: Cholecystectomy (laparoscopic) C-Section   GU PMH: Cystocele, midline - 04/24/2016 Mixed incontinence - 04/24/2016 Nocturia - 04/24/2016 Rectocele - 04/24/2016   NON-GU PMH: Arrhythmia Arthritis Depression   FAMILY HISTORY: No Family History    SOCIAL HISTORY: No Social History    REVIEW OF SYSTEMS:  GU Review Female:   Patient denies frequent urination, hard to postpone urination, burning /pain with urination, get up at night to urinate, leakage of urine, stream starts and stops, trouble starting your stream, have to strain to urinate, and being pregnant.  Gastrointestinal (Upper):   Patient denies nausea, vomiting, and  indigestion/ heartburn.  Gastrointestinal (Lower):   Patient denies diarrhea and constipation.  Constitutional:   Patient denies fever, weight loss, night sweats, and fatigue.  Skin:   Patient denies skin rash/ lesion and itching.  Eyes:   Patient denies blurred vision and double vision.  Ears/ Nose/ Throat:   Patient denies sore throat and sinus problems.  Hematologic/Lymphatic:   Patient denies swollen glands and easy bruising.  Cardiovascular:   Patient denies leg swelling and chest pains.  Respiratory:   Patient denies cough and shortness of breath.  Endocrine:   Patient denies excessive thirst.  Musculoskeletal:   Patient denies back pain and joint pain.  Neurological:   Patient denies headaches and dizziness.  Psychologic:   Patient denies depression and anxiety.   VITAL SIGNS: None   PAST DATA REVIEWED:  Source Of History:  Patient   PROCEDURES: None   ASSESSMENT:      ICD-10 Details  1 GU:   Cystocele, midline - N81.11   2   Mixed incontinence - N39.46               Notes:   I drew her a picture and we talked about prolapse surgery in detail. Pros, cons, general surgical and anesthetic risks, and other options including behavioral therapy, pessaries, and watchful waiting were discussed. She understands that prolapse repairs are successful in 80-85% of cases for prolapse symptoms and can recur anteriorly, posteriorly, and/or apically. She understands that in most cases I use a graft and general risks were discussed. Surgical risks were described but not limited to the discussion of injury to neighboring structures including the bowel (with possible life-threatening sepsis and colostomy), bladder, urethra, vagina (all resulting in further surgery), and ureter (resulting in re-implantation). We talked about injury to nerves/soft tissue leading to debilitating and intractable pelvic, abdominal, and lower extremity pain syndromes and neuropathies. The risks of buttock pain, intractable  dyspareunia, and vaginal narrowing and shortening with sequelae were discussed. Bleeding risks, transfusion rates, and infection were discussed. The risk of persistent, de novo, or worsening bladder and/or bowel incontinence/dysfunction was discussed. The need for CIC was described as well the usual post-operative course. The patient understands that she might not reach her treatment goal and that she might be worse following surgery.   After a thorough review of the management options for the patient's condition the patient  elected to proceed with surgical therapy as noted above. We have discussed the potential benefits and risks of the procedure, side effects of the proposed treatment, the likelihood of the patient achieving the goals of the procedure, and any potential problems that might occur during the procedure or recuperation. Informed consent has been obtained.

## 2016-11-24 ENCOUNTER — Encounter (HOSPITAL_COMMUNITY)
Admission: AD | Disposition: A | Discharge status: Home / Self Care | Payer: Self-pay | Source: Ambulatory Visit | Attending: Obstetrics & Gynecology

## 2016-11-24 ENCOUNTER — Encounter (HOSPITAL_COMMUNITY): Payer: Self-pay | Admitting: *Deleted

## 2016-11-24 ENCOUNTER — Ambulatory Visit (HOSPITAL_COMMUNITY)
Admission: AD | Admit: 2016-11-24 | Discharge: 2016-11-25 | Disposition: A | Discharge status: Home / Self Care | Payer: 59 | Source: Ambulatory Visit | Attending: Obstetrics & Gynecology | Admitting: Obstetrics & Gynecology

## 2016-11-24 ENCOUNTER — Ambulatory Visit (HOSPITAL_COMMUNITY): Payer: 59 | Admitting: Anesthesiology

## 2016-11-24 DIAGNOSIS — N816 Rectocele: Secondary | ICD-10-CM

## 2016-11-24 DIAGNOSIS — Z79899 Other long term (current) drug therapy: Secondary | ICD-10-CM | POA: Insufficient documentation

## 2016-11-24 DIAGNOSIS — Z79891 Long term (current) use of opiate analgesic: Secondary | ICD-10-CM | POA: Insufficient documentation

## 2016-11-24 DIAGNOSIS — K219 Gastro-esophageal reflux disease without esophagitis: Secondary | ICD-10-CM | POA: Diagnosis not present

## 2016-11-24 DIAGNOSIS — N888 Other specified noninflammatory disorders of cervix uteri: Secondary | ICD-10-CM | POA: Insufficient documentation

## 2016-11-24 DIAGNOSIS — E669 Obesity, unspecified: Secondary | ICD-10-CM | POA: Diagnosis not present

## 2016-11-24 DIAGNOSIS — J302 Other seasonal allergic rhinitis: Secondary | ICD-10-CM | POA: Diagnosis not present

## 2016-11-24 DIAGNOSIS — Z880 Allergy status to penicillin: Secondary | ICD-10-CM | POA: Insufficient documentation

## 2016-11-24 DIAGNOSIS — Z791 Long term (current) use of non-steroidal anti-inflammatories (NSAID): Secondary | ICD-10-CM | POA: Diagnosis not present

## 2016-11-24 DIAGNOSIS — N8111 Cystocele, midline: Secondary | ICD-10-CM

## 2016-11-24 DIAGNOSIS — N812 Incomplete uterovaginal prolapse: Secondary | ICD-10-CM | POA: Diagnosis present

## 2016-11-24 DIAGNOSIS — N8110 Cystocele, unspecified: Secondary | ICD-10-CM | POA: Diagnosis present

## 2016-11-24 DIAGNOSIS — M1990 Unspecified osteoarthritis, unspecified site: Secondary | ICD-10-CM | POA: Diagnosis not present

## 2016-11-24 HISTORY — PX: ANTERIOR AND POSTERIOR REPAIR: SHX5121

## 2016-11-24 HISTORY — PX: CYSTOSCOPY: SHX5120

## 2016-11-24 HISTORY — PX: LAPAROSCOPIC VAGINAL HYSTERECTOMY WITH SALPINGECTOMY: SHX6680

## 2016-11-24 LAB — HEMOGLOBIN AND HEMATOCRIT, BLOOD
HEMATOCRIT: 34.5 % — AB (ref 36.0–46.0)
HEMOGLOBIN: 11.8 g/dL — AB (ref 12.0–15.0)

## 2016-11-24 SURGERY — HYSTERECTOMY, VAGINAL, LAPAROSCOPY-ASSISTED, WITH SALPINGECTOMY
Anesthesia: General | Site: Bladder

## 2016-11-24 MED ORDER — DEXAMETHASONE SODIUM PHOSPHATE 4 MG/ML IJ SOLN
INTRAMUSCULAR | Status: AC
  Filled 2016-11-24: qty 1

## 2016-11-24 MED ORDER — SCOPOLAMINE 1 MG/3DAYS TD PT72
1.0000 | MEDICATED_PATCH | Freq: Once | TRANSDERMAL | Status: DC
  Administered 2016-11-24: 1.5 mg via TRANSDERMAL

## 2016-11-24 MED ORDER — DIPHENHYDRAMINE HCL 50 MG/ML IJ SOLN
INTRAMUSCULAR | Status: DC | PRN
  Administered 2016-11-24: 6.25 mg via INTRAVENOUS

## 2016-11-24 MED ORDER — LIDOCAINE-EPINEPHRINE (PF) 1 %-1:200000 IJ SOLN
INTRAMUSCULAR | Status: DC | PRN
  Administered 2016-11-24: 20 mL
  Administered 2016-11-24: 24 mL

## 2016-11-24 MED ORDER — SODIUM CHLORIDE 0.9 % IJ SOLN
INTRAMUSCULAR | Status: AC
  Filled 2016-11-24: qty 50

## 2016-11-24 MED ORDER — HYDROMORPHONE HCL 1 MG/ML IJ SOLN
INTRAMUSCULAR | Status: DC | PRN
  Administered 2016-11-24: 1 mg via INTRAVENOUS

## 2016-11-24 MED ORDER — METOCLOPRAMIDE HCL 5 MG/ML IJ SOLN
INTRAMUSCULAR | Status: AC
  Filled 2016-11-24: qty 2

## 2016-11-24 MED ORDER — PROPOFOL 10 MG/ML IV BOLUS
INTRAVENOUS | Status: AC
  Filled 2016-11-24: qty 20

## 2016-11-24 MED ORDER — SUGAMMADEX SODIUM 500 MG/5ML IV SOLN
INTRAVENOUS | Status: AC
  Filled 2016-11-24: qty 10

## 2016-11-24 MED ORDER — SODIUM CHLORIDE 0.9 % IV SOLN
Freq: Once | INTRAVENOUS | Status: DC
  Filled 2016-11-24: qty 500000

## 2016-11-24 MED ORDER — HYDROMORPHONE HCL 1 MG/ML IJ SOLN
INTRAMUSCULAR | Status: AC
  Filled 2016-11-24: qty 1

## 2016-11-24 MED ORDER — PHENYLEPHRINE 40 MCG/ML (10ML) SYRINGE FOR IV PUSH (FOR BLOOD PRESSURE SUPPORT)
PREFILLED_SYRINGE | INTRAVENOUS | Status: AC
  Filled 2016-11-24: qty 10

## 2016-11-24 MED ORDER — ONDANSETRON HCL 4 MG/2ML IJ SOLN
INTRAMUSCULAR | Status: DC | PRN
  Administered 2016-11-24: 4 mg via INTRAVENOUS

## 2016-11-24 MED ORDER — OXYCODONE HCL 5 MG PO TABS: 5.0000 mg | ORAL_TABLET | Freq: Once | ORAL | Status: DC | PRN

## 2016-11-24 MED ORDER — ESTRADIOL 0.1 MG/GM VA CREA
TOPICAL_CREAM | VAGINAL | Status: AC
  Filled 2016-11-24: qty 42.5

## 2016-11-24 MED ORDER — BUPIVACAINE HCL (PF) 0.25 % IJ SOLN
INTRAMUSCULAR | Status: DC | PRN
  Administered 2016-11-24: 10 mL

## 2016-11-24 MED ORDER — DEXAMETHASONE SODIUM PHOSPHATE 4 MG/ML IJ SOLN
INTRAMUSCULAR | Status: DC | PRN
  Administered 2016-11-24: 4 mg via INTRAVENOUS

## 2016-11-24 MED ORDER — DIPHENHYDRAMINE HCL 50 MG/ML IJ SOLN: 12.5000 mg | Freq: Four times a day (QID) | INTRAMUSCULAR | Status: DC | PRN

## 2016-11-24 MED ORDER — ONDANSETRON HCL 4 MG/2ML IJ SOLN: 4.0000 mg | Freq: Four times a day (QID) | INTRAMUSCULAR | Status: DC | PRN

## 2016-11-24 MED ORDER — LIDOCAINE HCL (CARDIAC) 20 MG/ML IV SOLN
INTRAVENOUS | Status: AC
  Filled 2016-11-24: qty 5

## 2016-11-24 MED ORDER — FENTANYL CITRATE (PF) 100 MCG/2ML IJ SOLN
INTRAMUSCULAR | Status: DC | PRN
  Administered 2016-11-24 (×5): 50 ug via INTRAVENOUS

## 2016-11-24 MED ORDER — DIPHENHYDRAMINE HCL 50 MG/ML IJ SOLN
INTRAMUSCULAR | Status: AC
  Filled 2016-11-24: qty 1

## 2016-11-24 MED ORDER — FENTANYL CITRATE (PF) 250 MCG/5ML IJ SOLN
INTRAMUSCULAR | Status: AC
  Filled 2016-11-24: qty 5

## 2016-11-24 MED ORDER — SCOPOLAMINE 1 MG/3DAYS TD PT72
MEDICATED_PATCH | TRANSDERMAL | Status: AC
  Administered 2016-11-24: 1.5 mg via TRANSDERMAL
  Filled 2016-11-24: qty 1

## 2016-11-24 MED ORDER — LIDOCAINE-EPINEPHRINE (PF) 1 %-1:200000 IJ SOLN
INTRAMUSCULAR | Status: AC
  Filled 2016-11-24: qty 30

## 2016-11-24 MED ORDER — LIDOCAINE-EPINEPHRINE 1 %-1:100000 IJ SOLN
INTRAMUSCULAR | Status: DC | PRN
  Administered 2016-11-24: 10 mL

## 2016-11-24 MED ORDER — HYDROMORPHONE HCL 1 MG/ML IJ SOLN
INTRAMUSCULAR | Status: AC
  Administered 2016-11-24: 0.5 mg via INTRAVENOUS
  Filled 2016-11-24: qty 0.5

## 2016-11-24 MED ORDER — MIDAZOLAM HCL 2 MG/2ML IJ SOLN
INTRAMUSCULAR | Status: DC | PRN
  Administered 2016-11-24: 2 mg via INTRAVENOUS

## 2016-11-24 MED ORDER — SODIUM CHLORIDE 0.9 % IR SOLN
Status: DC | PRN
  Administered 2016-11-24: 3000 mL

## 2016-11-24 MED ORDER — ESTRADIOL 0.1 MG/GM VA CREA
TOPICAL_CREAM | VAGINAL | Status: AC
Start: 2016-11-24 — End: 2016-11-24
  Filled 2016-11-24: qty 42.5

## 2016-11-24 MED ORDER — SUGAMMADEX SODIUM 200 MG/2ML IV SOLN
INTRAVENOUS | Status: DC | PRN
  Administered 2016-11-24: 220 mg via INTRAVENOUS

## 2016-11-24 MED ORDER — PANTOPRAZOLE SODIUM 40 MG IV SOLR
40.0000 mg | Freq: Every day | INTRAVENOUS | Status: DC
  Administered 2016-11-24: 40 mg via INTRAVENOUS
  Filled 2016-11-24: qty 40

## 2016-11-24 MED ORDER — MENTHOL 3 MG MT LOZG: 1.0000 | LOZENGE | OROMUCOSAL | Status: DC | PRN

## 2016-11-24 MED ORDER — LIDOCAINE-EPINEPHRINE 1 %-1:100000 IJ SOLN
INTRAMUSCULAR | Status: AC
  Filled 2016-11-24: qty 1

## 2016-11-24 MED ORDER — MEPERIDINE HCL 25 MG/ML IJ SOLN: 6.2500 mg | INTRAMUSCULAR | Status: DC | PRN

## 2016-11-24 MED ORDER — ROPIVACAINE HCL 5 MG/ML IJ SOLN
INTRAMUSCULAR | Status: AC
Start: 2016-11-24 — End: 2016-11-24
  Filled 2016-11-24: qty 30

## 2016-11-24 MED ORDER — DEXTROSE-NACL 5-0.45 % IV SOLN
INTRAVENOUS | Status: DC
  Administered 2016-11-24 – 2016-11-25 (×3): via INTRAVENOUS

## 2016-11-24 MED ORDER — PHENYLEPHRINE HCL 10 MG/ML IJ SOLN
INTRAMUSCULAR | Status: DC | PRN
  Administered 2016-11-24: 80 ug via INTRAVENOUS
  Administered 2016-11-24: 40 ug via INTRAVENOUS
  Administered 2016-11-24 (×2): 80 ug via INTRAVENOUS
  Administered 2016-11-24: 40 ug via INTRAVENOUS
  Administered 2016-11-24: 80 ug via INTRAVENOUS

## 2016-11-24 MED ORDER — ROPIVACAINE HCL 5 MG/ML IJ SOLN
INTRAMUSCULAR | Status: DC | PRN
  Administered 2016-11-24: 60 mL

## 2016-11-24 MED ORDER — ALUM & MAG HYDROXIDE-SIMETH 200-200-20 MG/5ML PO SUSP
30.0000 mL | ORAL | Status: DC | PRN
  Administered 2016-11-25: 30 mL via ORAL
  Filled 2016-11-24: qty 30

## 2016-11-24 MED ORDER — PROMETHAZINE HCL 25 MG/ML IJ SOLN: 6.2500 mg | INTRAMUSCULAR | Status: DC | PRN

## 2016-11-24 MED ORDER — BUPIVACAINE HCL (PF) 0.25 % IJ SOLN
INTRAMUSCULAR | Status: AC
  Filled 2016-11-24: qty 30

## 2016-11-24 MED ORDER — NALOXONE HCL 0.4 MG/ML IJ SOLN: 0.4000 mg | INTRAMUSCULAR | Status: DC | PRN

## 2016-11-24 MED ORDER — METOCLOPRAMIDE HCL 5 MG/ML IJ SOLN
INTRAMUSCULAR | Status: DC | PRN
  Administered 2016-11-24: 10 mg via INTRAVENOUS

## 2016-11-24 MED ORDER — DIPHENHYDRAMINE HCL 12.5 MG/5ML PO ELIX: 12.5000 mg | ORAL_SOLUTION | Freq: Four times a day (QID) | ORAL | Status: DC | PRN

## 2016-11-24 MED ORDER — HYDROMORPHONE 1 MG/ML IV SOLN
INTRAVENOUS | Status: DC
  Administered 2016-11-24: 0.8 mg via INTRAVENOUS
  Administered 2016-11-24: 0.6 mg via INTRAVENOUS
  Administered 2016-11-24: 16:00:00 via INTRAVENOUS
  Filled 2016-11-24: qty 25

## 2016-11-24 MED ORDER — SIMETHICONE 80 MG PO CHEW: 80.0000 mg | CHEWABLE_TABLET | Freq: Four times a day (QID) | ORAL | Status: DC | PRN

## 2016-11-24 MED ORDER — OXYCODONE-ACETAMINOPHEN 5-325 MG PO TABS
1.0000 | ORAL_TABLET | ORAL | Status: DC | PRN
  Administered 2016-11-24 – 2016-11-25 (×2): 2 via ORAL
  Administered 2016-11-25 (×3): 1 via ORAL
  Filled 2016-11-24 (×2): qty 1
  Filled 2016-11-24 (×3): qty 2

## 2016-11-24 MED ORDER — SODIUM CHLORIDE 0.9% FLUSH: 9.0000 mL | INTRAVENOUS | Status: DC | PRN

## 2016-11-24 MED ORDER — ROCURONIUM BROMIDE 100 MG/10ML IV SOLN
INTRAVENOUS | Status: DC | PRN
  Administered 2016-11-24: 50 mg via INTRAVENOUS
  Administered 2016-11-24 (×9): 10 mg via INTRAVENOUS

## 2016-11-24 MED ORDER — PROMETHAZINE HCL 25 MG/ML IJ SOLN: 12.5000 mg | INTRAMUSCULAR | Status: DC | PRN

## 2016-11-24 MED ORDER — STERILE WATER FOR IRRIGATION IR SOLN
Status: DC | PRN
Start: 2016-11-24 — End: 2016-11-24
  Administered 2016-11-24: 3000 mL

## 2016-11-24 MED ORDER — HYDROMORPHONE HCL 1 MG/ML IJ SOLN
0.2500 mg | INTRAMUSCULAR | Status: DC | PRN
  Administered 2016-11-24: 0.5 mg via INTRAVENOUS

## 2016-11-24 MED ORDER — GLYCOPYRROLATE 0.2 MG/ML IJ SOLN
INTRAMUSCULAR | Status: DC | PRN
  Administered 2016-11-24: 0.1 mg via INTRAVENOUS

## 2016-11-24 MED ORDER — ROCURONIUM BROMIDE 100 MG/10ML IV SOLN
INTRAVENOUS | Status: AC
  Filled 2016-11-24: qty 1

## 2016-11-24 MED ORDER — PHENAZOPYRIDINE HCL 200 MG PO TABS
200.0000 mg | ORAL_TABLET | ORAL | Status: AC
Start: 2016-11-24 — End: 2016-11-24
  Administered 2016-11-24: 200 mg via ORAL
  Filled 2016-11-24: qty 1

## 2016-11-24 MED ORDER — LIDOCAINE HCL (CARDIAC) 20 MG/ML IV SOLN
INTRAVENOUS | Status: DC | PRN
  Administered 2016-11-24: 80 mg via INTRAVENOUS

## 2016-11-24 MED ORDER — ONDANSETRON HCL 4 MG/2ML IJ SOLN
INTRAMUSCULAR | Status: AC
  Filled 2016-11-24: qty 2

## 2016-11-24 MED ORDER — ACETAMINOPHEN 325 MG PO TABS
650.0000 mg | ORAL_TABLET | ORAL | Status: DC | PRN
  Administered 2016-11-24: 650 mg via ORAL
  Filled 2016-11-24: qty 2

## 2016-11-24 MED ORDER — PROPOFOL 10 MG/ML IV BOLUS
INTRAVENOUS | Status: DC | PRN
  Administered 2016-11-24: 200 mg via INTRAVENOUS

## 2016-11-24 MED ORDER — MIDAZOLAM HCL 2 MG/2ML IJ SOLN
INTRAMUSCULAR | Status: AC
  Filled 2016-11-24: qty 2

## 2016-11-24 MED ORDER — LACTATED RINGERS IV SOLN
INTRAVENOUS | Status: DC
  Administered 2016-11-24 (×3): via INTRAVENOUS

## 2016-11-24 MED ORDER — OXYCODONE HCL 5 MG/5ML PO SOLN: 5.0000 mg | Freq: Once | ORAL | Status: DC | PRN

## 2016-11-24 SURGICAL SUPPLY — 95 items
ADH SKN CLS APL DERMABOND .7 (GAUZE/BANDAGES/DRESSINGS) ×3
APL SRG 38 LTWT LNG FL B (MISCELLANEOUS)
APPLICATOR ARISTA FLEXITIP XL (MISCELLANEOUS) IMPLANT
BLADE SURG 15 STRL LF C SS BP (BLADE) ×3 IMPLANT
BLADE SURG 15 STRL SS (BLADE) ×5
CABLE HIGH FREQUENCY MONO STRZ (ELECTRODE) IMPLANT
CANISTER SUCT 3000ML PPV (MISCELLANEOUS) ×5 IMPLANT
CATH FOLEY 2WAY SLVR  5CC 16FR (CATHETERS)
CATH FOLEY 2WAY SLVR 5CC 16FR (CATHETERS) IMPLANT
CATH ROBINSON RED A/P 16FR (CATHETERS) IMPLANT
CLOTH BEACON ORANGE TIMEOUT ST (SAFETY) ×5 IMPLANT
CONT PATH 16OZ SNAP LID 3702 (MISCELLANEOUS) ×5 IMPLANT
COVER BACK TABLE 60X90IN (DRAPES) ×5 IMPLANT
DECANTER SPIKE VIAL GLASS SM (MISCELLANEOUS) ×6 IMPLANT
DERMABOND ADVANCED (GAUZE/BANDAGES/DRESSINGS) ×2
DERMABOND ADVANCED .7 DNX12 (GAUZE/BANDAGES/DRESSINGS) ×3 IMPLANT
DEVICE CAPIO SLIM SINGLE (INSTRUMENTS) IMPLANT
DRAIN PENROSE 1/4X12 LTX (DRAIN) ×5 IMPLANT
DRAPE UNDERBUTTOCKS STRL (DRAPE) ×5 IMPLANT
DRSG OPSITE POSTOP 3X4 (GAUZE/BANDAGES/DRESSINGS) ×2 IMPLANT
DURAPREP 26ML APPLICATOR (WOUND CARE) ×5 IMPLANT
ELECT REM PT RETURN 9FT ADLT (ELECTROSURGICAL) ×5
ELECTRODE REM PT RTRN 9FT ADLT (ELECTROSURGICAL) ×3 IMPLANT
FILTER SMOKE EVAC LAPAROSHD (FILTER) ×5 IMPLANT
FORCEPS CUTTING 33CM 5MM (CUTTING FORCEPS) IMPLANT
GAUZE PACKING 2X5 YD STRL (GAUZE/BANDAGES/DRESSINGS) ×2 IMPLANT
GAUZE SPONGE 4X4 16PLY XRAY LF (GAUZE/BANDAGES/DRESSINGS) ×12 IMPLANT
GLOVE BIO SURGEON STRL SZ7.5 (GLOVE) ×7 IMPLANT
GLOVE BIO SURGEON STRL SZ8.5 (GLOVE) ×10 IMPLANT
GLOVE BIOGEL PI IND STRL 7.0 (GLOVE) ×18 IMPLANT
GLOVE BIOGEL PI INDICATOR 7.0 (GLOVE) ×12
GLOVE ECLIPSE 6.5 STRL STRAW (GLOVE) ×10 IMPLANT
GOWN STRL REUS W/TWL LRG LVL3 (GOWN DISPOSABLE) ×20 IMPLANT
HEMOSTAT ARISTA ABSORB 3G PWDR (MISCELLANEOUS) IMPLANT
HEMOSTAT SURGICEL 2X3 (HEMOSTASIS) ×4 IMPLANT
HEMOSTAT SURGICEL 4X8 (HEMOSTASIS) ×4 IMPLANT
LEGGING LITHOTOMY PAIR STRL (DRAPES) ×5 IMPLANT
NDL MAYO 6 CRC TAPER PT (NEEDLE) IMPLANT
NEEDLE HYPO 22GX1.5 SAFETY (NEEDLE) ×5 IMPLANT
NEEDLE INSUFFLATION 120MM (ENDOMECHANICALS) ×5 IMPLANT
NEEDLE MAYO 6 CRC TAPER PT (NEEDLE) ×5 IMPLANT
NS IRRIG 1000ML POUR BTL (IV SOLUTION) ×10 IMPLANT
OCCLUDER COLPOPNEUMO (BALLOONS) ×2 IMPLANT
PACK LAVH (CUSTOM PROCEDURE TRAY) ×5 IMPLANT
PACK ROBOTIC GOWN (GOWN DISPOSABLE) ×5 IMPLANT
PACK TRENDGUARD 450 HYBRID PRO (MISCELLANEOUS) IMPLANT
PACK TRENDGUARD 600 HYBRD PROC (MISCELLANEOUS) IMPLANT
PACK VAGINAL WOMENS (CUSTOM PROCEDURE TRAY) IMPLANT
PENCIL BUTTON HOLSTER BLD 10FT (ELECTRODE) ×5 IMPLANT
PLUG CATH AND CAP STER (CATHETERS) ×5 IMPLANT
POUCH LAPAROSCOPIC INSTRUMENT (MISCELLANEOUS) ×5 IMPLANT
PROTECTOR NERVE ULNAR (MISCELLANEOUS) ×10 IMPLANT
RETRACTOR STAY HOOK 5MM (MISCELLANEOUS) ×5 IMPLANT
SCISSORS LAP 5X35 DISP (ENDOMECHANICALS) IMPLANT
SEALER TISSUE G2 CVD JAW 35 (ENDOMECHANICALS) IMPLANT
SEALER TISSUE G2 CVD JAW 45CM (ENDOMECHANICALS)
SET CYSTO W/LG BORE CLAMP LF (SET/KITS/TRAYS/PACK) ×5 IMPLANT
SET IRRIG TUBING LAPAROSCOPIC (IRRIGATION / IRRIGATOR) ×2 IMPLANT
SHEARS HARMONIC ACE PLUS 36CM (ENDOMECHANICALS) ×2 IMPLANT
SHEET LAVH (DRAPES) ×3 IMPLANT
SLEEVE XCEL OPT CAN 5 100 (ENDOMECHANICALS) ×5 IMPLANT
SOLUTION ELECTROLUBE (MISCELLANEOUS) IMPLANT
SUT CAPIO ETHIBPND (SUTURE) IMPLANT
SUT SILK 2 0 FS (SUTURE) IMPLANT
SUT VIC AB 0 CT1 18XCR BRD8 (SUTURE) ×9 IMPLANT
SUT VIC AB 0 CT1 27 (SUTURE) ×15
SUT VIC AB 0 CT1 27XBRD ANBCTR (SUTURE) IMPLANT
SUT VIC AB 0 CT1 36 (SUTURE) ×10 IMPLANT
SUT VIC AB 0 CT1 8-18 (SUTURE) ×10
SUT VIC AB 0 CT2 27 (SUTURE) IMPLANT
SUT VIC AB 2-0 CT1 (SUTURE) ×10 IMPLANT
SUT VIC AB 2-0 SH 27 (SUTURE) ×20
SUT VIC AB 2-0 SH 27XBRD (SUTURE) ×12 IMPLANT
SUT VIC AB 3-0 PS2 18 (SUTURE) ×10
SUT VIC AB 3-0 PS2 18XBRD (SUTURE) ×3 IMPLANT
SUT VICRYL 0 TIES 12 18 (SUTURE) ×5 IMPLANT
SUT VICRYL 0 UR6 27IN ABS (SUTURE) ×5 IMPLANT
SYR 50ML LL SCALE MARK (SYRINGE) ×5 IMPLANT
SYR BULB IRRIGATION 50ML (SYRINGE) ×10 IMPLANT
SYRINGE 10CC LL (SYRINGE) ×4 IMPLANT
TIP UTERINE 5.1X6CM LAV DISP (MISCELLANEOUS) IMPLANT
TIP UTERINE 6.7X10CM GRN DISP (MISCELLANEOUS) IMPLANT
TIP UTERINE 6.7X6CM WHT DISP (MISCELLANEOUS) IMPLANT
TIP UTERINE 6.7X8CM BLUE DISP (MISCELLANEOUS) ×2 IMPLANT
TOWEL OR 17X24 6PK STRL BLUE (TOWEL DISPOSABLE) ×20 IMPLANT
TRAY FOLEY CATH SILVER 14FR (SET/KITS/TRAYS/PACK) ×10 IMPLANT
TRENDGUARD 450 HYBRID PRO PACK (MISCELLANEOUS) ×5
TRENDGUARD 600 HYBRID PROC PK (MISCELLANEOUS)
TROCAR ADV FIXATION 5X100MM (TROCAR) ×5 IMPLANT
TROCAR BALLN 12MMX100 BLUNT (TROCAR) IMPLANT
TROCAR XCEL NON-BLD 11X100MML (ENDOMECHANICALS) ×5 IMPLANT
TROCAR XCEL NON-BLD 5MMX100MML (ENDOMECHANICALS) ×2 IMPLANT
TUBING NON-CON 1/4 X 20 CONN (TUBING) ×4 IMPLANT
TUBING NON-CON 1/4 X 20' CONN (TUBING) ×1
WARMER LAPAROSCOPE (MISCELLANEOUS) ×5 IMPLANT

## 2016-11-24 NOTE — Op Note (Signed)
Preoperative diagnosis: Cystocele and rectocele and mild vault prolapse Postoperative diagnosis: Cystocele and rectocele and mild vault prolapse Surgery: Cystocele repair and rectocele repair and cystoscopy Surgeon: Dr. Lorin Picket Akaysha Cobern  Assistant: Dr. Orlie Pollen  The patient has the above diagnoses and consented to the above procedure. She was placed in lithotomy position in position by gynecology. Preoperative antibiotics were given. She underwent a hysterectomy. The ureteral sacral ligaments were tagged. The cuff was opened and run posteriorly by Dr. [name]. The patient did like to ooze a lot that the hemostasis was very good when I started my procedure  It was obvious that the tagged ureteral sacral ligaments were very well supported. The cuff was quite high in the vaginal vault. She had a very narrow pubic arch. She had a redundant labia minora and I actually had to use a double-ring retractor with small hooks to give myself a vaginal view.  She had a cystocele like a moderate bubble with a modest central defect. Based upon her anatomy and the fact that the ureteral sacral ligament and hemostatic sutures by Dr. [name] were quite high on the left and right side I could not use my Allises to start an incision. I spent a lot of time trying to do so. I finally started a midline incision with 1 Allis in the midline as a lead Allis and I made the incision with my Metzenbaum scissors. Over time I was able to sharply mobilized the underlying pubocervical fascia from the overlying thin vaginal wall mucosa to the white line bilaterally. I was happy with my mobilization at the apex  I did a 2 layer anterior repair with running 2-0 Vicryl on an SH needle. I did not distort the anatomy. I was very pleased with the repair. The cuff was very well supported.  I cystoscoped the patient. I had cystoscoped the patient after the hysterectomy and she had excellent bilateral jets. She had a cystocele noted. After  the cystocele repair there was no cystocele remaining. There was no bladder injury. She had excellent efflux and no distortion of the ureters  I trimmed a few millimeters of anterior vaginal wall mucosa and closed the anterior vaginal wall with running 2-0 Vicryl on a CT1 needle. I closed the apex with 0 Vicryl from the left to right and the right the left median in the midline. I reapproximated the tagged ureteral sacral ligaments in the midline with my usual technique  The patient had a modest grade 2 rectocele primarily in the upper two thirds of the vagina and its middle aspect. She had depression of the wall just inside the introitus. The hymenal ring was not very prominent.  I placed an Allis on the lower edge of the hymenal ring bilaterally. I carefully removed a very small triangle of perineal skin and excised it. With my usual Allis technique I made a long posterior incision and sharply dissected the overlying thin posterior vaginal wall from the underlying rectovaginal fascia to the pelvic sidewall bilaterally. I was very happy with the mobilization of the apex throughout. It took a long time again to do the mobilization.  A digital rectal examination demonstrated a grade 2 rectocele with reasonably strong tissue.  I did a posterior repair with running 2-0 Vicryl on an SH needle not distorting the anatomy and keeping good length. Repeat rectal examination demonstrated no rectal injury and excellent repair  I decided virtually not to trim any posterior vaginal wall mucosa. The patient started then bleeding from the  pelvic sidewall near the introitus on her left side. I oversewed 2 areas a total of 3 times with interrupted figure-of-eight 2-0 Vicryl. She was very oozy but overall I felt was very safe to close the posterior vaginal wall. Hemostasis was also with cautery with my usual technique. I used a number of pieces of Surgicel just in case.  Because of the oozing and her introital anatomy I  actually closed the posterior vaginal wall down to the introitus and continued to run it. I had placed 1 gentle 0 Vicryl perineal body suture well. I then used 3 interrupted 2-0 Vicryl in the perineum. I want to make sure the perineal closure was tight to help with hemostasis.  I was very diligent applying 2 vaginal packs with Estrace cream. I wanted a good tight pack. She was oozing more than I thought she should but again I thought was very safe and that things will be fine postoperatively.  The patient will be followed as per protocol  She had an excellent repair anteriorly and posteriorly at the of the case with very good length and no narrowing

## 2016-11-24 NOTE — Anesthesia Postprocedure Evaluation (Signed)
Anesthesia Post Note  Patient: Kathryn Pineda  Procedure(s) Performed: LAPAROSCOPIC ASSISTED VAGINAL HYSTERECTOMY WITH SALPINGO-OOPHORECTOMY (Bilateral Abdomen) CYSTOSCOPY (N/A Bladder) ANTERIOR (CYSTOCELE) AND POSTERIOR REPAIR (RECTOCELE) (N/A )     Patient location during evaluation: PACU Anesthesia Type: General Level of consciousness: awake and alert Pain management: pain level controlled Vital Signs Assessment: post-procedure vital signs reviewed and stable Respiratory status: spontaneous breathing, nonlabored ventilation, respiratory function stable and patient connected to nasal cannula oxygen Cardiovascular status: blood pressure returned to baseline and stable Postop Assessment: no apparent nausea or vomiting Anesthetic complications: no    Last Vitals:  Vitals:   11/24/16 1415 11/24/16 1429  BP: 99/60 (!) 104/59  Pulse: 78 92  Resp: 11 16  Temp: 37 C   SpO2: 94% 95%    Last Pain:  Vitals:   11/24/16 0624  TempSrc: Oral   Pain Goal: Patients Stated Pain Goal: 3 (11/24/16 0624)               Beryle Lathe

## 2016-11-24 NOTE — Anesthesia Preprocedure Evaluation (Signed)
Anesthesia Evaluation    History of Anesthesia Complications (+) PONV  Airway Mallampati: II  TM Distance: >3 FB Neck ROM: Full    Dental no notable dental hx.    Pulmonary    Pulmonary exam normal breath sounds clear to auscultation       Cardiovascular Normal cardiovascular exam Rhythm:Regular Rate:Normal     Neuro/Psych  Headaches,    GI/Hepatic GERD  ,  Endo/Other    Renal/GU      Musculoskeletal  (+) Arthritis , Osteoarthritis,    Abdominal (+) + obese,   Peds  Hematology   Anesthesia Other Findings   Reproductive/Obstetrics                             Anesthesia Physical Anesthesia Plan  ASA: II  Anesthesia Plan: General   Post-op Pain Management:    Induction: Intravenous  PONV Risk Score and Plan: 4 or greater and Ondansetron, Dexamethasone, Midazolam, Scopolamine patch - Pre-op and Treatment may vary due to age or medical condition  Airway Management Planned: Oral ETT  Additional Equipment:   Intra-op Plan:   Post-operative Plan: Extubation in OR  Informed Consent: I have reviewed the patients History and Physical, chart, labs and discussed the procedure including the risks, benefits and alternatives for the proposed anesthesia with the patient or authorized representative who has indicated his/her understanding and acceptance.   Dental advisory given  Plan Discussed with: CRNA  Anesthesia Plan Comments:         Anesthesia Quick Evaluation

## 2016-11-24 NOTE — Transfer of Care (Signed)
Immediate Anesthesia Transfer of Care Note  Patient: Kathryn Pineda  Procedure(s) Performed: LAPAROSCOPIC ASSISTED VAGINAL HYSTERECTOMY WITH SALPINGO-OOPHORECTOMY (Bilateral Abdomen) CYSTOSCOPY (N/A Bladder) ANTERIOR (CYSTOCELE) AND POSTERIOR REPAIR (RECTOCELE) (N/A )  Patient Location: PACU  Anesthesia Type:General  Level of Consciousness: awake, alert  and oriented  Airway & Oxygen Therapy: Patient Spontanous Breathing and Patient connected to nasal cannula oxygen  Post-op Assessment: Report given to RN and Post -op Vital signs reviewed and stable  Post vital signs: Reviewed and stable  Last Vitals:  Vitals:   11/24/16 0624  BP: 120/78  Pulse: 74  Resp: 16  Temp: 36.8 C  SpO2: 99%    Last Pain:  Vitals:   11/24/16 0624  TempSrc: Oral      Patients Stated Pain Goal: 3 (11/24/16 1610)  Complications: No apparent anesthesia complications

## 2016-11-24 NOTE — H&P (Signed)
Kathryn Pineda is an 61 y.o. female [phone] MWF with incomplete uterine prolapse, cystocele, rectocele here for definitive surgery with LAVH/BSO, cystoscopy and repair in combination with with Dr. Sherron Monday.  Risks, benefits, alternatives have also been reviewed.  Questions answered.  Pt here and ready to proceed.  Pertinent Gynecological History: Menses: post-menopausal Bleeding: none Contraception: vasectomy DES exposure: denies Blood transfusions: none Sexually transmitted diseases: no past history Last mammogram: normal Date: 12/17 Last pap: normal Date: 03/06/16 OB History: G4, P4   Menstrual History: No LMP recorded. Patient is postmenopausal.    Past Medical History:  Diagnosis Date  . Anal fissure   . Anemia   . Arthritis   . Biliary colic 5/96   ERCP w/spincterotomy  . Breast pain   . Bronchitis   . Condyloma   . Fatigue    loss of sleep  . Generalized headaches   . GERD (gastroesophageal reflux disease)   . Hemorrhoid   . Hiatal hernia   . Lump in female breast   . Migraines   . Obesity   . Phlebitis of leg   . PONV (postoperative nausea and vomiting)   . Primary osteoarthritis of both knees   . Stress incontinence       . Wilson's disease     Past Surgical History:  Procedure Laterality Date  . ANAL FISSURECTOMY  1986  . BUNIONECTOMY Bilateral 2005  . CESAREAN SECTION  1990   prolapsed cord  . ENDOVENOUS ABLATION SAPHENOUS VEIN W/ LASER  2008   both lower legs  . LAPAROSCOPIC CHOLECYSTECTOMY  1997  . LAPAROSCOPIC GASTRIC BANDING  09/16/10   w/hernia repair  . LAPAROSCOPIC GASTRIC SLEEVE RESECTION WITH HIATAL HERNIA REPAIR    . UPPER GI ENDOSCOPY      Family History  Problem Relation Age of Onset  . Diabetes Father   . Colon polyps Father   . Osteoarthritis Mother   . Hypertension Brother   . Cancer Maternal Uncle        leukemia  . Cancer Maternal Grandmother        unaware    Social History:  reports that she has never smoked. She has  never used smokeless tobacco. She reports that she drinks alcohol. She reports that she does not use drugs.  Allergies:  Allergies  Allergen Reactions  . Adhesive [Tape] Other (See Comments)    Band-Aid caused pt to blister   . Penicillins Itching, Rash and Other (See Comments)    All over body. All deriatives    Prescriptions Prior to Admission  Medication Sig Dispense Refill Last Dose  . Cholecalciferol (VITAMIN D PO) Take 2,000 Units by mouth daily.    11/22/2016 at Unknown time  . ibuprofen (ADVIL,MOTRIN) 800 MG tablet Take 1 tablet (800 mg total) by mouth every 8 (eight) hours as needed. (Patient taking differently: Take 800 mg by mouth every 8 (eight) hours as needed for headache or moderate pain. ) 30 tablet 0 Past Week at Unknown time  . loratadine (CLARITIN) 10 MG tablet Take 10 mg by mouth daily.   11/23/2016 at Unknown time  . Multiple Vitamin (MULTIVITAMIN) tablet Take 1 tablet by mouth every other day.    Past Week at Unknown time  . oxyCODONE-acetaminophen (PERCOCET) 5-325 MG tablet Take 2 tablets by mouth every 4 (four) hours as needed. use only as much as needed to relieve pain (Patient taking differently: Take 2 tablets by mouth every 4 (four) hours as needed for moderate pain.  use only as much as needed to relieve pain) 30 tablet 0     Review of Systems  All other systems reviewed and are negative.   Blood pressure 120/78, pulse 74, temperature 98.3 F (36.8 C), temperature source Oral, resp. rate 16, SpO2 99 %. Physical Exam  Constitutional: She is oriented to person, place, and time. She appears well-developed and well-nourished.  HENT:  Head: Normocephalic and atraumatic.  Cardiovascular: Normal rate and regular rhythm.   Respiratory: Effort normal and breath sounds normal.  Neurological: She is alert and oriented to person, place, and time.  Skin: Skin is warm and dry.  Psychiatric: She has a normal mood and affect.    No results found for this or any previous  visit (from the past 24 hour(s)).  No results found.  Assessment/Plan: 61 yo G4P4 MWF here for definitive surgery, LAVH/BSO,cystoscopy for incomplete uterine prolapse.  A&P repair with vault suspension will be done with Dr. Sherron Monday.  Family is here.  Questions answered.  Pt ready to rpoceed.  Valentina Shaggy SUZANNE 11/24/2016, 7:05 AM

## 2016-11-24 NOTE — Anesthesia Procedure Notes (Signed)
Procedure Name: Intubation Date/Time: 11/24/2016 7:49 AM Performed by: Flossie Dibble Pre-anesthesia Checklist: Patient identified, Patient being monitored, Timeout performed, Emergency Drugs available and Suction available Patient Re-evaluated:Patient Re-evaluated prior to induction Oxygen Delivery Method: Circle System Utilized Preoxygenation: Pre-oxygenation with 100% oxygen Induction Type: IV induction Ventilation: Mask ventilation without difficulty Laryngoscope Size: Mac and 3 Grade View: Grade II Tube type: Oral Tube size: 7.0 mm Number of attempts: 1 Airway Equipment and Method: stylet Placement Confirmation: ETT inserted through vocal cords under direct vision,  positive ETCO2 and breath sounds checked- equal and bilateral Secured at: 21 cm Tube secured with: Tape Dental Injury: Teeth and Oropharynx as per pre-operative assessment

## 2016-11-24 NOTE — Progress Notes (Signed)
Day of Surgery Procedure(s) (LRB): LAPAROSCOPIC ASSISTED VAGINAL HYSTERECTOMY WITH SALPINGO-OOPHORECTOMY (Bilateral) CYSTOSCOPY (N/A) ANTERIOR (CYSTOCELE) AND POSTERIOR REPAIR (RECTOCELE) (N/A)  Subjective: Patient reports no nausea.  Having pelvic pressure that is helped by pain medication.  She does not like the way the pain medication makes her feel but doesn't feel.  Had liquids a little earlier.  Had many questions.  All answered.  Objective: I have reviewed patient's vital signs, intake and output, medications and labs. Vitals:   11/24/16 1704 11/24/16 1744  BP: (!) 106/53   Pulse: (!) 56 (!) 56  Resp: 16 17  Temp: 98.3 F (36.8 C)   SpO2: 96% 95%   3pm Hb:  11.8  General: alert and cooperative Resp: clear to auscultation bilaterally Cardio: regular rate and rhythm, S1, S2 normal, no murmur, click, rub or gallop GI: soft, non-tender; bowel sounds normal; no masses,  no organomegaly and incision: clean, dry and intact Extremities: extremities normal, atraumatic, no cyanosis or edema and SCDs functioning Vaginal Bleeding: none and packing in place     Assessment: s/p Procedure(s): LAPAROSCOPIC ASSISTED VAGINAL HYSTERECTOMY WITH SALPINGO-OOPHORECTOMY (Bilateral) CYSTOSCOPY (N/A) ANTERIOR (CYSTOCELE) AND POSTERIOR REPAIR (RECTOCELE) (N/A): stable and progressing well  Plan: Consider advance to PO medication later and hold or D/C pca Pt will stand/ambulate later this evening Foley to SD.  Will remove in AM and proceed with voiding trials CBC and BMP in AM Continue SCDs  LOS: 0 days    Kathryn Pineda 11/24/2016, 6:48 PM

## 2016-11-24 NOTE — Op Note (Signed)
11/24/2016  10:28 AM  PATIENT:  Kathryn Pineda  61 y.o. female  PRE-OPERATIVE DIAGNOSIS:  incomplete vaginal prolapse, cystocele, rectocele  POST-OPERATIVE DIAGNOSIS:  incomplete vaginal prolapse, cystocele, rectocele  PROCEDURE:  Procedure(s): LAPAROSCOPIC ASSISTED VAGINAL HYSTERECTOMY WITH SALPINGO-OOPHORECTOMY  This portion is planned with Dr. Matilde Sprang:  CYSTOSCOPY ANTERIOR (CYSTOCELE) AND POSTERIOR REPAIR (RECTOCELE) VAGINAL VAULT SUSPENSION WITH GRAFT  SURGEON:  Judye Lorino SUZANNE  ASSISTANTS: Sumner Boast, MD   ANESTHESIA:   general  ESTIMATED BLOOD LOSS: 100cc  BLOOD ADMINISTERED:none   FLUIDS: 1400cc LR  UOP: 150cc orange tinged urine from pyridium that Dr. Matilde Sprang had pt take pre-operatively   SPECIMEN:  Uterus, cervix, bilateral tubes and ovaries  DISPOSITION OF SPECIMEN:  PATHOLOGY  FINDINGS: normal uterus, ovaries, tubes.  Adhesions from lap band placement in upper abdomen.  3rd degree cystocele and rectocele.  DESCRIPTION OF OPERATION: Patient is taken to the operating room. She is placed in the supine position. She is a running IV in place. Informed consent was present on the chart. SCDs on her lower extremities and functioning properly. Patient was positioned while she was awake.  Her legs were placed in the low lithotomy position in [facility]. Her arms were tucked by the side.  General endotracheal anesthesia was administered by the anesthesia staff without difficulty. Dr. Sabra Heck, anesthesia, oversaw case.  Time out performed.    Chlora prep was then used to prep the abdomen and Betadine was used to prep the inner thighs, perineum and vagina. Once 3 minutes had past the patient was draped in a normal standard fashion. The legs were lifted to the high lithotomy position. Exam under anesthesia was performed.  The cervix was visualized by placing a heavy weighted speculum in the posterior aspect of the vagina and using a curved Deaver retractor to the  retract anteriorly. The anterior lip of the cervix was grasped with single-tooth tenaculum.  The cervix sounded to 9 cm. Pratt dilators were used to dilate the cervix up to a #21. A RUMI uterine manipulator was obtained. A #8disposable tip was placed on the RUMI manipulator as well as a 3.0, silver KOH ring. This was passed through the cervix and the bulb of the disposable tip was inflated with 10 cc of normal saline. There was a good fit of the KOH ring around the cervix. The tenaculum was removed. There is also good manipulation of the uterus. The speculum and retractor were removed as well. A Foley catheter was placed to straight drain.  Orange tinged urine was noted. Legs were lowered to the low lithotomy position and attention was turned the abdomen.  The umbilicus was everted.  A Veress needle was obtained. Syringe of sterile saline was placed on a open Veress needle.  This was passed into the umbilicus until just when the fluid started to drip.  Then low flow CO2 gas was attached the needle and the pneumoperitoneum was achieved without difficulty. Once four liters of gas was in the abdomen the Veress needle was removed and a 5 millimeter non-bladed Optiview trocar and port were passed directly to the abdomen. The laparoscope was then used to confirm intraperitoneal placement. A large uterus with fundal fibroid was noted.  There was some endometriosis appearing tissue on the back of the cervix.  Ovaries were normal.  Are where prior tubal reanastomosis had been performed was seen and looks really nice, without any adhesions.  Locations for RLQ, LLQ ports were noted by transillumination of the abdominal wall.  0.25% marcaine  was used to anesthetize the skin.  85m skin incisions were made and 59mnonbladed trochars and ports was placed in the RLQ and LLQ.  All trochars were removed.    Ureters were identifies.  Attention was turned to the left side. With uterus on stretch the left IP ligament was serially  clamped, cauterized and incised.  Left round ligament was serially clamped cauterized and incised. The anterior and posterior peritoneum of the inferior leaf of the broad ligament were opened. The beginning of the baldder flap was created.  The bladder was taken down below the level of the KOH ring. The left uterine artery skeletonized and then just superior to the KOH ring this vessel was serially clamped, cauterized, and incised.  Attention was turned the right side.  The uterus was placed on stretch to the opposite side.  The right IP ligament was serially clamped, cauterized and incised with the Ligasure device. Next the right round ligament was serially clamped cauterized and incised. The anterior posterior peritoneum of the inferiorly for the broad ligament were opened. The anterior peritoneum was carried across to the dissection on the left side. The remainder of the bladder flap was created using sharp and blunt dissection. The bladder was well below the level of the KOH ring. The left uterine artery skeletonized. Then the left uterine artery, above the level of the KOH ring, was serially clamped cauterized and incised. The uterus was devascularized at this point.  Pedicles were inspected.  Pneumoperitoneum was relived to ensure no bleeding was noted.  Excellent hemostasis was noted.  Decision made to proceed from below at this point.  Pneumoperitoneum was relieved after RLQ and LLQ ports were removed.  Umbilical port was removed.  Skin was closed with subcuticular stitch of #3.0 Vicryl and dermabond applied.    Attention was then turned vaginally. Patient positioned in high lithotomy positioned. Heavy weight speculum was placed in posterior vaginal. Posterior peritoneum was entered sharply. 1% Lidocaine mixed 1:1 with epinephrine (1:200,000U) was instilled in the cervix. 10cc total used. Anterior mucosa incised. Pubovesicocervical fascia was incised and anterior cul de sac entered. Then using Heaney  clamps, the uterosacral ligaments were clamped, incised, and sutured with 0 Vicryl. The cardinal ligaments were serially clamped, incised, and suture ligated. Dissection from below met laparoscopic dissection and the specimen was delivered vaginally.    Pedicles were inspected.  Small amount of bleeding along the cuff was noted.  Cuff was run with a 0 Vicryl suture in a running interlocking fashion from the 2 o'clock position to the 10 o'clock position.  Cuff was not closed per Dr. MaMikle Bosworthirection. He was present and ready to take over at this point. The Foley catheter was left in place. Sponge, lap, needle, and instrument counts were correct x2. Patient tolerated the procedure very well.  Before I left the OR, Dr. MaMatilde Sprangid perform a cystoscopy and the bladder was intact and ureters were normal with normal urine jets present bilaterally.  COUNTS:  YES  PLAN OF CARE: Transfer to PACU

## 2016-11-25 ENCOUNTER — Encounter (HOSPITAL_COMMUNITY): Payer: Self-pay | Admitting: Obstetrics & Gynecology

## 2016-11-25 DIAGNOSIS — N8110 Cystocele, unspecified: Secondary | ICD-10-CM | POA: Diagnosis not present

## 2016-11-25 DIAGNOSIS — Z791 Long term (current) use of non-steroidal anti-inflammatories (NSAID): Secondary | ICD-10-CM | POA: Diagnosis not present

## 2016-11-25 DIAGNOSIS — K219 Gastro-esophageal reflux disease without esophagitis: Secondary | ICD-10-CM | POA: Diagnosis not present

## 2016-11-25 DIAGNOSIS — N888 Other specified noninflammatory disorders of cervix uteri: Secondary | ICD-10-CM | POA: Diagnosis not present

## 2016-11-25 DIAGNOSIS — Z79899 Other long term (current) drug therapy: Secondary | ICD-10-CM | POA: Diagnosis not present

## 2016-11-25 DIAGNOSIS — Z79891 Long term (current) use of opiate analgesic: Secondary | ICD-10-CM | POA: Diagnosis not present

## 2016-11-25 DIAGNOSIS — N816 Rectocele: Secondary | ICD-10-CM | POA: Diagnosis not present

## 2016-11-25 DIAGNOSIS — Z880 Allergy status to penicillin: Secondary | ICD-10-CM | POA: Diagnosis not present

## 2016-11-25 DIAGNOSIS — E669 Obesity, unspecified: Secondary | ICD-10-CM | POA: Diagnosis not present

## 2016-11-25 LAB — BASIC METABOLIC PANEL
Anion gap: 7 (ref 5–15)
BUN: 6 mg/dL (ref 6–20)
CHLORIDE: 105 mmol/L (ref 101–111)
CO2: 28 mmol/L (ref 22–32)
Calcium: 8.6 mg/dL — ABNORMAL LOW (ref 8.9–10.3)
Creatinine, Ser: 0.67 mg/dL (ref 0.44–1.00)
GFR calc Af Amer: >60 mL/min (ref >60)
GFR calc non Af Amer: >60 mL/min (ref >60)
Glucose, Bld: 128 mg/dL — ABNORMAL HIGH (ref 65–99)
POTASSIUM: 4.5 mmol/L (ref 3.5–5.1)
SODIUM: 140 mmol/L (ref 135–145)

## 2016-11-25 LAB — CBC
HEMATOCRIT: 31.8 % — AB (ref 36.0–46.0)
HEMOGLOBIN: 10.7 g/dL — AB (ref 12.0–15.0)
MCH: 31.3 pg (ref 26.0–34.0)
MCHC: 33.6 g/dL (ref 30.0–36.0)
MCV: 93 fL (ref 78.0–100.0)
Platelets: 208 10*3/uL (ref 150–400)
RBC: 3.42 MIL/uL — AB (ref 3.87–5.11)
RDW: 13.5 % (ref 11.5–15.5)
WBC: 10.1 10*3/uL (ref 4.0–10.5)

## 2016-11-25 MED ORDER — PANTOPRAZOLE SODIUM 40 MG PO TBEC: 40.0000 mg | DELAYED_RELEASE_TABLET | Freq: Every day | ORAL | Status: DC

## 2016-11-25 NOTE — Progress Notes (Signed)
Discharge instructions given to pt and s/o. Pt verbalized understanding. Gave pt 2 percocets before discharge as per pt request. IV taken out and tolerated well. Pt preferred to walk downstairs besides being wheeled down. Pt in stable condition when I walked pt to her car.

## 2016-11-25 NOTE — Progress Notes (Signed)
1 Day Post-Op Procedure(s) (LRB): LAPAROSCOPIC ASSISTED VAGINAL HYSTERECTOMY WITH SALPINGO-OOPHORECTOMY (Bilateral) CYSTOSCOPY (N/A) ANTERIOR (CYSTOCELE) AND POSTERIOR REPAIR (RECTOCELE) (N/A)  Subjective: Patient reports she is feeling much better.  Feels mentally "clearer" as medication has worn off.  Pain control is good.  Biggest complaint in the pressure from her packing which I will remove.  Passing flatus.  Eating regular diet.  Did ambulate last night.   Objective: I have reviewed patient's vital signs, intake and output, medications and labs. Vitals:   11/24/16 2306 11/25/16 0434  BP: (!) 122/42 (!) 95/52  Pulse:  60  Resp: 15 16  Temp:  97.9 F (36.6 C)  SpO2: 95% 95%  Post op hb:  10.7  General: alert and cooperative Resp: clear to auscultation bilaterally Cardio: regular rate and rhythm, S1, S2 normal, no murmur, click, rub or gallop GI: soft, non-tender; bowel sounds normal; no masses,  no organomegaly and incision: clean, dry and intact Extremities: extremities normal, atraumatic, no cyanosis or edema and SCDs in place Vaginal Bleeding: minimal  Packing removed:  Scant bleeding with removal Foley removed as well  Assessment: s/p Procedure(s): LAPAROSCOPIC ASSISTED VAGINAL HYSTERECTOMY WITH SALPINGO-OOPHORECTOMY (Bilateral) CYSTOSCOPY (N/A) ANTERIOR (CYSTOCELE) AND POSTERIOR REPAIR (RECTOCELE) (N/A): stable and progressing well  Plan: D/C IV  Voiding trials today Hopeful discharge later  LOS: 0 days    Valentina Shaggy SUZANNE 11/25/2016, 8:43 AM

## 2016-11-25 NOTE — Progress Notes (Signed)
Yesterday: no pain vitals normal labs good Today: vitals normal Labs OK No pain; looks good Instructions detailed

## 2016-11-25 NOTE — Progress Notes (Signed)
Orders placed by urologist for RN to take foley and vaginal packing out. Pt will prefer Dr  [name] to take vaginal packing and foley out this morning.

## 2016-11-25 NOTE — Progress Notes (Signed)
Notified Dr. Sherron Monday of pt PVR of 12 cc. Ok to discharge per Dr.

## 2016-11-25 NOTE — Discharge Summary (Signed)
Physician Discharge Summary  Patient ID: Kathryn Pineda MRN: [id number] DOB/AGE: 03-23-1955 61 y.o.  Admit date: 11/24/2016 Discharge date: 11/25/2016  Admission Diagnoses: incomplete uterine prolapse, cystocele, rectocele  Discharge Diagnoses:  None  Discharged Condition: good  Hospital Course: Patient admitted through same day surgery.  She was taken to OR where LAVH/BSO, A&P repair with Dr. Sherron Monday were performed.  Surgical findings included incomplete uterine prolapse with third degree cystocele and rectocele.  Surgery was uneventful.  EBL 250cc.  Foley catheter was left in placed due to vaginal packing being placed at the end of the procedure.  Patient transferred to PACU where she was stable and then to 3rd floor for the remainder of her hospitalization.  During her post-op recovery, her vitals and stable and she was AF.  In evening of POD#0, she was able to transition to oral pain medications and regular diet.  She was able to ambulate and she had good pain control.  Patient seen both in the evening of POD#0 and AM of POD#1.  In the AM of POD#1, she was without complaint except for pressure due to the vaginal packing.  In AM of POD#1, packing and foley was removed.  Pt was able to void on own with PVR <100 checked with bladder scanner.  Post op hb was 10.7, decreased from 13.3, pre-operatively.  At this point, patient was voiding, walking, having excellent pain control, had no nausea, and minimal vaginal bleeding.  She was ready for D/C.  Consults: None  Significant Diagnostic Studies: labs: post op CBC and BMP with post op hb 10.7  Treatments: surgery: LAVH/BSO, cysto with A&P repair with Dr. Sherron Monday  Discharge Exam: Blood pressure 114/63, pulse (!) 52, temperature 98.5 F (36.9 C), temperature source Oral, resp. rate 18, height  (1.753 m), weight 242 lb (109.8 kg), SpO2 98 %. General appearance: alert and cooperative Resp: clear to auscultation bilaterally Cardio: regular  rate and rhythm, S1, S2 normal, no murmur, click, rub or gallop GI: soft, non-tender; bowel sounds normal; no masses,  no organomegaly Extremities: extremities normal, atraumatic, no cyanosis or edema Incision/Wound:c/d/i  Disposition: 01-Home or Self Care   Allergies as of 11/25/2016      Reactions   Adhesive [tape] Other (See Comments)   Band-Aid caused pt to blister    Penicillins Itching, Rash, Other (See Comments)   All over body. All deriatives      Medication List    TAKE these medications   ibuprofen 800 MG tablet Commonly known as:  ADVIL,MOTRIN Take 1 tablet (800 mg total) by mouth every 8 (eight) hours as needed. What changed:  reasons to take this   loratadine 10 MG tablet Commonly known as:  CLARITIN Take 10 mg by mouth daily.   multivitamin tablet Take 1 tablet by mouth every other day.   oxyCODONE-acetaminophen 5-325 MG tablet Commonly known as:  PERCOCET Take 2 tablets by mouth every 4 (four) hours as needed. use only as much as needed to relieve pain What changed:  reasons to take this  additional instructions   VITAMIN D PO Take 2,000 Units by mouth daily.      Follow-up Information    [name], MD On 11/30/2016.   Specialty:  Gynecology Why:  pt already has appt date and time Contact information: [address] [place] [address] [phone]           Signed: Annamaria Boots 11/25/2016, 12:01 PM

## 2016-11-25 NOTE — Discharge Instructions (Signed)
Post Op Hysterectomy Instructions Please read the instructions below. Refer to these instructions for the next few weeks. These instructions provide you with general information on caring for yourself after surgery. Your caregiver may also give you specific instructions. While your treatment has been planned according to the most current medical practices available, unavoidable problems sometimes happen. If you have any problems or questions after you leave, please call your caregiver.  HOME CARE INSTRUCTIONS Healing will take time. You will have discomfort, tenderness, swelling and bruising at the operative site for a couple of weeks. This is normal and will get better as time goes on.   Only take over-the-counter or prescription medicines for pain, discomfort or fever as directed by your caregiver.   Do not take aspirin. It can cause bleeding.   Do not drive when taking pain medication.   Follow your caregivers advice regarding diet, exercise, lifting, driving and general activities.   Resume your usual diet as directed and allowed.   Get plenty of rest and sleep.   Do not douche, use tampons, or have sexual intercourse until your caregiver gives you permission. .   Take your temperature if you feel hot or flushed.   You may shower today when you get home.  No tub bath for one week.    Do not drink alcohol until you are not taking any narcotic pain medications.   Try to have someone home with you for a week or two to help with the household activities.   Be careful over the next two to three weeks with any activities at home that involve lifting, pushing, or pulling.  Listen to your body--if something feels uncomfortable to do, then don't do it.  Make sure you and your family understands everything about your operation and recovery.   Walking up stairs is fine.  Do not sign any legal documents until you feel normal again.   Keep all your follow-up appointments as recommended by  your caregiver.   Remove scopolamine patch on Thursday.  PLEASE CALL THE OFFICE IF:  There is swelling, redness or increasing pain in the wound area.   Pus is coming from the wound.   You notice a bad smell from the wound or surgical dressing.   You have pain, redness and swelling from the intravenous site.   The wound is breaking open (the edges are not staying together).    You develop pain or bleeding when you urinate.   You develop abnormal vaginal discharge.   You have any type of abnormal reaction or develop an allergy to your medication.   You need stronger pain medication for your pain   SEEK IMMEDIATE MEDICAL CARE:  You develop a temperature of 100.5 or higher.   You develop abdominal pain.   You develop chest pain.   You develop shortness of breath.   You pass out.   You develop pain, swelling or redness of your leg.   You develop heavy vaginal bleeding with or without blood clots.   MEDICATIONS:  Restart your regular medications BUT wait one week before restarting all vitamins and mineral supplements  Use Motrin  every 8 hours for the next several days.  This will help you use less Percocet.  Use the Percocet 5/325 1-2 tabs every 4-6 hours as needed for pain.  You may use an over the counter stool softener like Colace or Dulcolax to help with starting a bowel movement.  Start the day after you go home.  Warm liquids, fluids, and ambulation help too.  If you have not had a bowel movement in four days, you need to call the office.

## 2016-11-25 NOTE — Progress Notes (Signed)
The patient is receiving pantoprazole by the intravenous route.  Based on criteria approved by the Pharmacy and Therapeutics Committee and the Medical Executive Committee, the medication is being converted to the equivalent oral dose form.  These criteria include: -No Active GI bleeding -Able to tolerate diet of full liquids (or better) or tube feeding -Able to tolerate other medications by the oral or enteral route  If you have any questions about this conversion, please contact the Pharmacy Department (ext [phone]).  Thank you.

## 2016-11-30 ENCOUNTER — Other Ambulatory Visit: Payer: Self-pay | Admitting: *Deleted

## 2016-11-30 ENCOUNTER — Telehealth: Payer: Self-pay | Admitting: *Deleted

## 2016-11-30 ENCOUNTER — Ambulatory Visit (INDEPENDENT_AMBULATORY_CARE_PROVIDER_SITE_OTHER): Payer: 59 | Admitting: Obstetrics & Gynecology

## 2016-11-30 ENCOUNTER — Encounter: Payer: Self-pay | Admitting: Obstetrics & Gynecology

## 2016-11-30 VITALS — BP 110/74 | HR 70 | Resp 14 | Ht 68.25 in | Wt 241.0 lb

## 2016-11-30 DIAGNOSIS — Z9889 Other specified postprocedural states: Secondary | ICD-10-CM

## 2016-11-30 MED ORDER — HYDROCODONE-ACETAMINOPHEN 5-325 MG PO TABS: 1.0000 | ORAL_TABLET | Freq: Four times a day (QID) | ORAL | 0 refills | Status: DC | PRN

## 2016-11-30 MED ORDER — LIDOCAINE 5 % EX OINT: 1.0000 "application " | TOPICAL_OINTMENT | Freq: Four times a day (QID) | CUTANEOUS | 0 refills | Status: DC | PRN

## 2016-11-30 NOTE — Patient Outreach (Signed)
[facility]) Care Management  11/30/2016  Kathryn Pineda [birth year] [id number]   Subjective: Telephone call to patient's home  number, no answer, message states memory full, and unable to leave a message.   Objective:Per chart review, patient hospitalized 11/24/16 -11/25/16 for incomplete vaginal prolapse, cystocele, rectocele.  Status post LAPAROSCOPIC ASSISTED VAGINAL HYSTERECTOMY WITH SALPINGO-OOPHORECTOMY and CYSTOSCOPY ANTERIOR (CYSTOCELE) AND POSTERIOR REPAIR (RECTOCELE) VAGINAL VAULT SUSPENSION WITH GRAFT on 11/24/16.   Patient has a history of Wilson's disease and LAPAROSCOPIC GASTRIC BANDING.     Assessment:  Received UMR Preoperative / Transition of care referral on 11/12/16.   Transition of care follow up pending patient contact.     Plan: RNCM will call patient for 2nd telephone outreach attempt, transition of care follow up, within 10 business days if no return call.    Millissa Deese H. Gardiner Barefoot, BSN, CCM [facility] Care Management [facility] Telephonic CM Phone: [phone] Fax: [phone]

## 2016-11-30 NOTE — Progress Notes (Signed)
Post Operative Visit  Procedure: LAPAROSCOPIC ASSISTED VAGINAL HYSTERECTOMY WITH SALPINGO-OOPHORECTOMY, CYSTOSCOPY, ANTERIOR (CYSTOCELE) AND POSTERIOR REPAIR (RECTOCELE)     Days Post-op: 6 days  Subjective: Reports having some bowel movement issues.  Felt she maybe had a partial bowel obstruction but this resolved.  She is feeling much more regular.  Had increased bleeding after the really hard bowel movement occurred.  Having some persistent watery discharge (possible urinary leakage) and spotting.  Pain has improved significantly.  Pt states she feels that her labia swelling is much better.  Has a "touchy" area near the rectum.  Thinks there may be a suture present.    Denies fever.  Emptying bladder normally.  Denies fever/chills.  Denies vaginal odor.  Objective: BP 110/74 (BP Location: Right Arm, Patient Position: Sitting, Cuff Size: Normal)   Pulse 70   Resp 14   Ht 5' 8.25" (1.734 m)   Wt 241 lb (109.3 kg)   BMI 36.38 kg/m   EXAM General: alert and cooperative Resp: clear to auscultation bilaterally Cardio: regular rate and rhythm GI: soft, non-tender; bowel sounds normal; no masses,  no organomegaly and incision: clean, dry and intact Extremities: extremities normal, atraumatic, no cyanosis or edema Vaginal Bleeding: minimal  Gyn:  NAEFG with suture knot at base of perineorrhaphy with the most sensitivity, skin of perineorrhaphy appears to have been pulled but is still intact.  Vaginal mucosa posteriorly appears to have small area of necrotic tissue but there is no odor and discharge is appropriate.  She is not tender to palpation on vaginal exam  Assessment: s/p LAVH/BSO, A&P repair with Dr. Sherron Monday  Plan: Recheck 4 weeks Will contact Dr. Sherron Monday about seeing pt next week instead of on Thursday to reassess vaginal tissue. Topical lidocaine 5% ointment to area around suture knot for improved pain control as surgery was too recent to cut knot at this time.

## 2016-11-30 NOTE — Telephone Encounter (Signed)
Call to patient. Patient requests prescription for lidocaine be sent to [facility]. Prescription re entered and sent to [facility]. Call to [facility] and Jerrel Ivory states she will cancel the order.   Patient agreeable to disposition. Will close encounter.

## 2016-12-01 ENCOUNTER — Other Ambulatory Visit: Payer: Self-pay | Admitting: *Deleted

## 2016-12-01 NOTE — Patient Outreach (Signed)
[facility]) Care Management  12/01/2016  Kathryn Pineda February 22, 1955 [id number]   Subjective: Telephone call to patient's home  number, no answer, message states memory full, and unable to leave a message.   Objective:Per chart review, patient hospitalized 11/24/16 -11/25/16 for incomplete vaginal prolapse, cystocele, rectocele.  Status post LAPAROSCOPIC ASSISTED VAGINAL HYSTERECTOMY WITH SALPINGO-OOPHORECTOMY and CYSTOSCOPY ANTERIOR (CYSTOCELE) AND POSTERIOR REPAIR (RECTOCELE) VAGINAL VAULT SUSPENSION WITH GRAFT on 11/24/16.   Patient has a history of Wilson's disease and LAPAROSCOPIC GASTRIC BANDING.     Assessment:  Received UMR Preoperative / Transition of care referral on 11/12/16.   Transition of care follow up pending patient contact.     Plan: RNCM will call patient for 3rd telephone outreach attempt, transition of care follow up, within 10 business days if no return call.    Trannie Bardales H. Gardiner Barefoot, BSN, CCM [facility] Care Management [facility] Telephonic CM Phone: [phone] Fax: [phone]

## 2016-12-02 ENCOUNTER — Encounter: Payer: Self-pay | Admitting: *Deleted

## 2016-12-02 ENCOUNTER — Other Ambulatory Visit: Payer: Self-pay | Admitting: *Deleted

## 2016-12-02 DIAGNOSIS — N8111 Cystocele, midline: Secondary | ICD-10-CM | POA: Diagnosis not present

## 2016-12-02 NOTE — Patient Outreach (Signed)
[facility] Care [facility]  12/02/2016  Kathryn FillersLisa F Pineda January 30, 1956 [id number]   Subjective: .Telephone call to patient's home number, no answer, left HIPAA compliant voicemail message, and requested call back.   Objective:Per chart review, patient hospitalized 11/24/16 -11/25/16 for incomplete vaginal prolapse, cystocele, rectocele. Status post LAPAROSCOPIC ASSISTED VAGINAL HYSTERECTOMY WITH SALPINGO-OOPHORECTOMY and CYSTOSCOPY ANTERIOR (CYSTOCELE) AND POSTERIOR REPAIR (RECTOCELE) VAGINAL VAULT SUSPENSION WITH GRAFT on 11/24/16. Patient has a history of Wilson's disease and LAPAROSCOPIC GASTRIC BANDING.    Assessment: Received UMR Preoperative / Transition of care referral on 11/12/16. Transition of care follow up pending patient contact.     Plan: RNCM will send unsuccessful outreach  letter, [facility] pamphlet, and proceed with case closure, within 10 business days if no return call.   Kathryn Pineda H. Gardiner Barefootooper RN, BSN, CCM [facility] Telephonic CM Phone: [phone] Fax: [phone]

## 2016-12-03 MED FILL — HYDROCODON-APAP 5-325: 5-325 | 4 days supply | Qty: 30 | Fill #0

## 2016-12-18 ENCOUNTER — Other Ambulatory Visit: Payer: Self-pay | Admitting: *Deleted

## 2016-12-18 NOTE — Patient Outreach (Signed)
[facility] Care Management  12/18/2016  Kathryn Pineda 07-13-1955 [id number]   No response from patient outreach attempts will proceed with case closure.    Objective:Per chart review, patient hospitalized 11/24/16 -11/25/16 for incomplete vaginal prolapse, cystocele, rectocele. Status post LAPAROSCOPIC ASSISTED VAGINAL HYSTERECTOMY WITH SALPINGO-OOPHORECTOMY and CYSTOSCOPY ANTERIOR (CYSTOCELE) AND POSTERIOR REPAIR (RECTOCELE) VAGINAL VAULT SUSPENSION WITH GRAFT on 11/24/16. Patient has a history of Wilson's disease and LAPAROSCOPIC GASTRIC BANDING.    Assessment: Received UMR Preoperative / Transition of care referral on 11/12/16. Transition of care follow up not completed due to unable to contact patient and will proceed with case closure.     Plan: RNCM will send case closure due to unable to reach request to Kathryn [facility].     Kathryn Nardozzi H. Gardiner Barefootooper RN, BSN, CCM [facility] Telephonic CM Phone: [phone] Fax: [phone]

## 2017-01-01 ENCOUNTER — Encounter: Payer: Self-pay | Admitting: Obstetrics & Gynecology

## 2017-01-01 ENCOUNTER — Ambulatory Visit (INDEPENDENT_AMBULATORY_CARE_PROVIDER_SITE_OTHER): Payer: 59 | Admitting: Obstetrics & Gynecology

## 2017-01-01 VITALS — BP 120/74 | HR 84 | Resp 14 | Ht 68.25 in | Wt 241.0 lb

## 2017-01-01 DIAGNOSIS — Z9889 Other specified postprocedural states: Secondary | ICD-10-CM

## 2017-01-01 NOTE — Progress Notes (Signed)
Post Operative Visit  Procedure: Laparoscopy Assisted Vaginal Hysterectomy with Salpingo- oophorectomy. Cystoscopy, anterior cystocele and posterior repair rectocele.  Days Post-op: 38  Subjective: Doing well.  Incontinence has resolved.  Spotting has continued but seems to have stopped.  Taking OTC  Ibuprofen and tylenol prn.  Bowel function is good.  Having more gas pain than she expected.  Bowel movements do not hurt any longer.    Objective: BP 120/74 (BP Location: Right Arm, Patient Position: Sitting, Cuff Size: Large)   Pulse 84   Resp 14   Ht 5' 8.25" (1.734 m)   Wt 241 lb (109.3 kg)   BMI 36.38 kg/m   EXAM General: alert and cooperative GI: soft, non-tender; bowel sounds normal; no masses,  no organomegaly and incision: clean, dry and intact Extremities: extremities normal, atraumatic, no cyanosis or edema Vaginal Bleeding: none  GYN:  NAEFG, vaginal with anterior and posterior incision and sutures still present, small area of granulation tissue on the posterior suture line about 2-3 cm inside the vagina, no masses, mildly and appropriately tender  Assessment: s/p LAVH, A&P repair, cystoscopy in combination with Dr. Sherron MondayMacDiarmid  Plan: She has appt with Dr. Sherron MondayMacDiarmid in one month.  Restrictions reviewed with pelvic rest and lifting restrictions.  Note given for work for lifting nothing >10 pounds for 12 weeks post op.

## 2017-01-04 ENCOUNTER — Ambulatory Visit: Payer: 59 | Admitting: Obstetrics & Gynecology

## 2017-01-27 DIAGNOSIS — N8111 Cystocele, midline: Secondary | ICD-10-CM | POA: Diagnosis not present

## 2017-01-27 DIAGNOSIS — N816 Rectocele: Secondary | ICD-10-CM | POA: Diagnosis not present

## 2017-02-19 ENCOUNTER — Telehealth: Payer: Self-pay | Admitting: *Deleted

## 2017-02-19 NOTE — Telephone Encounter (Signed)
Patient was given a name to a primary care provider [facility] at [facility] but she can not remember the name of who Dr Hyacinth MeekerMiller suggested.

## 2017-02-19 NOTE — Telephone Encounter (Signed)
Spoke with patient. Provided Dr. Danise EdgeStacey Blyth at [facility]. [phone]. Patient will call directly to schedule, verbalizes understanding and is agreeable.   Routing to provider for final review. Patient is agreeable to disposition. Will close encounter.

## 2017-02-19 NOTE — Telephone Encounter (Signed)
Dr. Rogelia RohrerBlythe is likely not accepting new patients.  Dr. Drue NovelPaz and Dr. Patsy Lageropland are, I think.  Please give these names as well.  One is female and one is female.  Thanks.

## 2017-02-19 NOTE — Telephone Encounter (Signed)
Spoke with patient. Patient states she is scheduled with Dr. Dallas Schimkeopeland, thankful for return call.   Routing to provider for final review. Patient is agreeable to disposition. Will close encounter.

## 2017-03-18 ENCOUNTER — Other Ambulatory Visit: Payer: Self-pay | Admitting: Obstetrics & Gynecology

## 2017-03-18 DIAGNOSIS — Z1231 Encounter for screening mammogram for malignant neoplasm of breast: Secondary | ICD-10-CM

## 2017-03-19 ENCOUNTER — Ambulatory Visit
Admission: RE | Admit: 2017-03-19 | Discharge: 2017-03-19 | Disposition: A | Discharge status: Home / Self Care | Payer: PRIVATE HEALTH INSURANCE | Source: Ambulatory Visit | Attending: Obstetrics & Gynecology | Admitting: Obstetrics & Gynecology

## 2017-03-19 DIAGNOSIS — Z1231 Encounter for screening mammogram for malignant neoplasm of breast: Secondary | ICD-10-CM

## 2017-06-04 ENCOUNTER — Ambulatory Visit: Payer: 59 | Admitting: Obstetrics & Gynecology

## 2017-06-05 NOTE — Progress Notes (Signed)
[facility] [address], [address] [place], [place] [address] [phone] [phone]  Date:  06/10/2017   Name:  Kathryn FillersLisa F Pineda   DOB:  11-14-1955   MRN:  [id number]  PCP:  Kathryn Pineda    Chief Complaint: Establish Care (Pt here to est care. )   History of Present Illness:  Kathryn Pineda is a 62 y.o. very pleasant female patient who presents with the following:  Here today for a new patient visit History of obesity, lab band placement in 2012 Her GYN is Kathryn Pineda  She is an Charity fundraiserN at [employer] She had 4 children, and tragically lost one at 3410 months old- 3 surviving adult children She does not have any grandchildren yet   This past October she had a hysterectomy per Kathryn Pineda Her mother was just dx with Parkinsons and her dad with Alzhemiers disease in rapid succession This has caused her to get more concerned about getting dementia herself She brings with her a book called "The End of Alzheimer's" and has started trying to follow some of the suggestions therein.  She seems to have a tremendous amount of faith in this book and is trying to follow 36 points of advice to prevent alzheimer's.  She asks about many different lab markers and other things that are mentioned in the book, some of which are outside of my comfort level such as testing for heavy metals and doing extensive hormone testing.    She also wishes to discuss her suspicion that she has "leaky gut syndrome," and also her concern about exposure to various toxins in her home and in her food.    She noted some numbness and tingling in her right arm- this is positional and goes away with movement of her arm No weakness noted She has noted a little nodule on her forehead which she has noticed for several years, not tender, she is not sure if this is getting larger  For exercise she had been doing really well prior to her hysterectomy The operation was a big  deal, and it took her a long time to recover She just started back a few weeks ago, she does body weight calisthetics and some aerobics She enjoys doing things outdoors when she can  She is not taking any rx medications at this time.    Also noted that wilson's disease is listed in her history but she thinks this is an error and is not aware of any history of wilsons disease  Patient Active Problem List   Diagnosis Date Noted  . Rectocele 11/13/2016  . Incomplete uterine prolapse 11/13/2016  . Cystocele, midline 11/13/2016  . Seasonal allergies 09/17/2014  . Incontinence   . Obesity (BMI 30-39.9) 12/24/2010  . Hx of laparoscopic gastric banding 09/23/2010    Past Medical History:  Diagnosis Date  . Anal fissure   . Anemia   . Arthritis   . Biliary colic 5/96   ERCP w/spincterotomy  . Breast pain   . Bronchitis   . Condyloma   . Fatigue    loss of sleep  . Generalized headaches   . GERD (gastroesophageal reflux disease)   . Hemorrhoid   . Hiatal hernia   . Lump in female breast   . Migraines   . Obesity   . Phlebitis of leg   . PONV (postoperative nausea and vomiting)   . Primary osteoarthritis of both knees   .  Stress incontinence       . Wilson's disease     Past Surgical History:  Procedure Laterality Date  . ANAL FISSURECTOMY  1986  . ANTERIOR AND POSTERIOR REPAIR N/A 11/24/2016   Procedure: ANTERIOR (CYSTOCELE) AND POSTERIOR REPAIR (RECTOCELE);  Surgeon: Alfredo Martinez, Pineda;  Location: [facility];  Service: Urology;  Laterality: N/A;  . BUNIONECTOMY Bilateral 2005  . CESAREAN SECTION  1990   prolapsed cord  . CYSTOSCOPY N/A 11/24/2016   Procedure: CYSTOSCOPY;  Surgeon: Alfredo Martinez, Pineda;  Location: [facility];  Service: Urology;  Laterality: N/A;  . ENDOVENOUS ABLATION SAPHENOUS VEIN W/ LASER  2008   both lower legs  . LAPAROSCOPIC CHOLECYSTECTOMY  1997  . LAPAROSCOPIC GASTRIC BANDING  09/16/10   w/hernia repair  . LAPAROSCOPIC GASTRIC SLEEVE RESECTION WITH  HIATAL HERNIA REPAIR    . LAPAROSCOPIC VAGINAL HYSTERECTOMY WITH SALPINGECTOMY Bilateral 11/24/2016   Procedure: LAPAROSCOPIC ASSISTED VAGINAL HYSTERECTOMY WITH SALPINGO-OOPHORECTOMY;  Surgeon: [name], Pineda;  Location: [facility];  Service: Gynecology;  Laterality: Bilateral;  . UPPER GI ENDOSCOPY      Social History   Tobacco Use  . Smoking status: Never Smoker  . Smokeless tobacco: Never Used  Substance Use Topics  . Alcohol use: Yes    Alcohol/week: 0.0 oz    Comment: "one drink once or twice a week"  . Drug use: No    Family History  Problem Relation Age of Onset  . Diabetes Father   . Colon polyps Father   . Osteoarthritis Mother   . Hypertension Brother   . Cancer Maternal Uncle        leukemia  . Cancer Maternal Grandmother        unaware    Allergies  Allergen Reactions  . Adhesive [Tape] Other (See Comments)    Band-Aid caused pt to blister   . Penicillins Itching, Rash and Other (See Comments)    All over body. All deriatives    Medication list has been reviewed and updated.  Current Outpatient Medications on File Prior to Visit  Medication Sig Dispense Refill  . acetaminophen (TYLENOL) 325 MG tablet Take 650 mg every 6 (six) hours as needed by mouth.    . Cholecalciferol (VITAMIN D PO) Take 2,000 Units by mouth daily.     Marland Kitchen ibuprofen (ADVIL,MOTRIN) 200 MG tablet Take 200 mg every 8 (eight) hours as needed by mouth.    . loratadine (CLARITIN) 10 MG tablet Take 10 mg by mouth daily as needed.     . Multiple Vitamin (MULTIVITAMIN) tablet Take 1 tablet by mouth every other day.     . Omega-3 Fatty Acids (FISH OIL) 1000 MG CAPS Take by mouth.    . psyllium (METAMUCIL) 58.6 % packet Take 1 packet daily as needed by mouth.     No current facility-administered medications on file prior to visit.     Review of Systems:  As per HPI- otherwise negative.   Physical Examination: Vitals:   06/10/17 1005  BP: 121/81  Pulse: 65  Temp: 98.2 F (36.8 C)   SpO2: 98%   Vitals:   06/10/17 1005  Weight: 235 lb 12.8 oz (107 kg)  Height: 5' 8.25" (1.734 m)   Body mass index is 35.59 kg/m. Ideal Body Weight: Weight in (lb) to have BMI = 25: 165.3  GEN: WDWN, NAD, Non-toxic, A & O x 3, obese, otherwise looks well  HEENT: Atraumatic, Normocephalic. Neck supple. No masses, No LAD.  Bilateral TM wnl, oropharynx  normal.  PEERL,EOMI.   Left upper forehead displays a flat, slightly mobile mass about 8 mm in diameter just deep to the skin. ?cyst Ears and Nose: No external deformity. CV: RRR, No M/G/R. No JVD. No thrill. No extra heart sounds. PULM: CTA B, no wheezes, crackles, rhonchi. No retractions. No resp. distress. No accessory muscle use. EXTR: No c/c/e NEURO Normal gait.  PSYCH: Normally interactive. Conversant. Not depressed or anxious appearing.  Calm demeanor.    Assessment and Plan: Family history of Alzheimer's disease - Plan: Ambulatory referral to Neurology  Skin nodule - Plan: Ambulatory referral to Dermatology  Feeling worried  Here today with concern about eventually developing alzheimer's disease.  She has a book which has been very influential over her- she actually gives me a copy today- and she wishes to follow the 36 points outlined in this book.   While certainly her book may contain good information, books like this can be found regarding nearly any common medical disorder and I explained that I am not able to review or follow all of the advice in her book. She also wishes to be evaluated for "leaky gut syndrome," exposure to various toxins including mold, and to have a battery of blood tests many of which are not part of my standard practice.  Advised her that [facility] may be a good resource for her.  I certainly understand her wish to prevent alzheimer's disease if she can.  She will ask KathrynLupton about the nodule on her head- if he feels it would be helpful I am glad to order an [place] for her. I will also refer  her to neurology so she may discus her concerns about alzheimer's risk in greater detail   I spent >40 minutes face to face with pt today, over 75% counseling   Signed Abbe Amsterdam, Pineda

## 2017-06-10 ENCOUNTER — Ambulatory Visit (INDEPENDENT_AMBULATORY_CARE_PROVIDER_SITE_OTHER): Payer: PRIVATE HEALTH INSURANCE | Admitting: Family Medicine

## 2017-06-10 ENCOUNTER — Encounter: Payer: Self-pay | Admitting: Family Medicine

## 2017-06-10 VITALS — BP 121/81 | HR 65 | Temp 98.2°F | Ht 68.25 in | Wt 235.8 lb

## 2017-06-10 DIAGNOSIS — R229 Localized swelling, mass and lump, unspecified: Secondary | ICD-10-CM | POA: Diagnosis not present

## 2017-06-10 DIAGNOSIS — R4589 Other symptoms and signs involving emotional state: Secondary | ICD-10-CM | POA: Diagnosis not present

## 2017-06-10 DIAGNOSIS — Z820 Family history of epilepsy and other diseases of the nervous system: Secondary | ICD-10-CM | POA: Diagnosis not present

## 2017-06-10 DIAGNOSIS — R4582 Worries: Secondary | ICD-10-CM

## 2017-06-10 NOTE — Patient Instructions (Addendum)
I am sorry to hear about your mom and dad's recent health problems.  You might want to be seen at [facility] to discuss some of your concerns and the special labs that you would like to have done.    Address: [address], [place], [place] [address] Phone: [phone]  I will also refer you to neurology to talk to you about Alzheimer's disease concerns  I certainly agree with trying to eat a healthy diet and exercise   Have Dr. Lona MillardLupon take a look at your forehead and if you need me to I can do an ultrasound for you.

## 2017-07-20 ENCOUNTER — Encounter: Payer: Self-pay | Admitting: Family Medicine

## 2017-07-20 ENCOUNTER — Telehealth: Payer: Self-pay

## 2017-07-20 DIAGNOSIS — R229 Localized swelling, mass and lump, unspecified: Secondary | ICD-10-CM

## 2017-07-20 NOTE — Telephone Encounter (Signed)
Copied from CRM [id number]. Topic: General - Other >> Jul 19, 2017  2:42 PM Darletta MollLander, Lumin L wrote: Reason for CRM: Patient would like to move forward with the ultrasound for her forehead that Dr. Patsy Lageropland suggested. She wants her to know she has not been tod dermatology and prefers to do the imaging at your facilities.

## 2017-07-20 NOTE — Addendum Note (Signed)
Addended by: Abbe AmsterdamOPLAND, JESSICA C on: 07/20/2017 04:14 PM   Modules accepted: Orders

## 2017-07-22 ENCOUNTER — Encounter: Payer: Self-pay | Admitting: Family Medicine

## 2017-07-22 ENCOUNTER — Other Ambulatory Visit: Payer: Self-pay | Admitting: Family Medicine

## 2017-07-22 ENCOUNTER — Ambulatory Visit (HOSPITAL_BASED_OUTPATIENT_CLINIC_OR_DEPARTMENT_OTHER)
Admission: RE | Admit: 2017-07-22 | Discharge: 2017-07-22 | Disposition: A | Discharge status: Home / Self Care | Payer: No Typology Code available for payment source | Source: Ambulatory Visit | Attending: Family Medicine | Admitting: Family Medicine

## 2017-07-22 DIAGNOSIS — R229 Localized swelling, mass and lump, unspecified: Secondary | ICD-10-CM | POA: Diagnosis not present

## 2017-07-22 DIAGNOSIS — Z820 Family history of epilepsy and other diseases of the nervous system: Secondary | ICD-10-CM

## 2017-07-24 ENCOUNTER — Other Ambulatory Visit: Payer: Self-pay | Admitting: Family Medicine

## 2017-07-24 ENCOUNTER — Encounter: Payer: Self-pay | Admitting: Family Medicine

## 2017-07-24 DIAGNOSIS — Q759 Congenital malformation of skull and face bones, unspecified: Secondary | ICD-10-CM

## 2017-08-03 ENCOUNTER — Ambulatory Visit (HOSPITAL_BASED_OUTPATIENT_CLINIC_OR_DEPARTMENT_OTHER)
Admission: RE | Admit: 2017-08-03 | Discharge: 2017-08-03 | Disposition: A | Discharge status: Home / Self Care | Payer: No Typology Code available for payment source | Source: Ambulatory Visit | Attending: Family Medicine | Admitting: Family Medicine

## 2017-08-03 ENCOUNTER — Encounter: Payer: Self-pay | Admitting: Family Medicine

## 2017-08-03 DIAGNOSIS — Q759 Congenital malformation of skull and face bones, unspecified: Secondary | ICD-10-CM | POA: Diagnosis present

## 2017-08-15 ENCOUNTER — Encounter: Payer: Self-pay | Admitting: Family Medicine

## 2017-08-15 DIAGNOSIS — R4582 Worries: Secondary | ICD-10-CM

## 2017-08-15 DIAGNOSIS — R4589 Other symptoms and signs involving emotional state: Principal | ICD-10-CM

## 2017-08-17 MED FILL — PROGESTERONE 100 MG CAPSULE: 100 | 60 days supply | Qty: 120 | Fill #0

## 2017-08-24 NOTE — Progress Notes (Signed)
[facility] at [facility] [address] [place], [place] [address] [phone]  Date:  08/25/2017   Name:  Kathryn Pineda   DOB:  11/14/1955   MRN:  [id number]  PCP:  Pearline Cables, MD    Chief Complaint: Stomach Issues (cramping, diarrhea, several months, follow up)   History of Present Illness:  Kathryn Pineda is a 62 y.o. very pleasant female patient who presents with the following:  History of uterine prolapse, gastric bypass.  I saw her in April at which time she was very concerned with the possibility of her getting Alzheimer's disease in the future, and she has since requested referral to an alternative type provider who follows the principals of a book she found about the topic.  She also wondered about "leaky gut syndrome" and exposure to various toxins.    She did end up seeing [place] "integrative medicine[place] offered to do some of the protocol that she wanted to do.  They did start her on HRT.  She has a long list of labs that she would like to have drawn today as she is not able to get any insurance coverage if drawn out of our system.    Advised that many of the labs that she wishes to have drawn are not covered by insurance and that I will need to defer interpretation of some of her results, as these are labs I would not choose to order.  She is in agreement   She is taking a "gut health" supplement that is provided by her [facility] provider  Here today with concern of   She notes some medial epidondylitis sx in the left arm more so if she will lift weighs, lean on the area or use her computer   Patient Active Problem List   Diagnosis Date Noted  . Rectocele 11/13/2016  . Incomplete uterine prolapse 11/13/2016  . Cystocele, midline 11/13/2016  . Seasonal allergies 09/17/2014  . Incontinence   . Obesity (BMI 30-39.9) 12/24/2010  . Hx of laparoscopic gastric banding  09/23/2010    Past Medical History:  Diagnosis Date  . Anal fissure   . Anemia   . Arthritis   . Biliary colic 5/96   ERCP w/spincterotomy  . Breast pain   . Bronchitis   . Condyloma   . Fatigue    loss of sleep  . Generalized headaches   . GERD (gastroesophageal reflux disease)   . Hemorrhoid   . Hiatal hernia   . Lump in female breast   . Migraines   . Obesity   . Phlebitis of leg   . PONV (postoperative nausea and vomiting)   . Primary osteoarthritis of both knees   . Stress incontinence       . Wilson's disease     Past Surgical History:  Procedure Laterality Date  . ANAL FISSURECTOMY  1986  . ANTERIOR AND POSTERIOR REPAIR N/A 11/24/2016   Procedure: ANTERIOR (CYSTOCELE) AND POSTERIOR REPAIR (RECTOCELE);  Surgeon: Alfredo Martinez, MD;  Location: [facility];  Service: Urology;  Laterality: N/A;  . BUNIONECTOMY Bilateral 2005  . CESAREAN SECTION  1990   prolapsed cord  . CYSTOSCOPY N/A 11/24/2016   Procedure: CYSTOSCOPY;  Surgeon: Alfredo Martinez, MD;  Location: [facility];  Service: Urology;  Laterality: N/A;  . ENDOVENOUS ABLATION SAPHENOUS VEIN W/ LASER  2008   both lower legs  .  LAPAROSCOPIC CHOLECYSTECTOMY  1997  . LAPAROSCOPIC GASTRIC BANDING  09/16/10   w/hernia repair  . LAPAROSCOPIC GASTRIC SLEEVE RESECTION WITH HIATAL HERNIA REPAIR    . LAPAROSCOPIC VAGINAL HYSTERECTOMY WITH SALPINGECTOMY Bilateral 11/24/2016   Procedure: LAPAROSCOPIC ASSISTED VAGINAL HYSTERECTOMY WITH SALPINGO-OOPHORECTOMY;  Surgeon: Jerene BearsMiller, Mary S, MD;  Location: [facility];  Service: Gynecology;  Laterality: Bilateral;  . UPPER GI ENDOSCOPY      Social History   Tobacco Use  . Smoking status: Never Smoker  . Smokeless tobacco: Never Used  Substance Use Topics  . Alcohol use: Yes    Alcohol/week: 0.0 oz    Comment: "one drink once or twice a week"  . Drug use: No    Family History  Problem Relation Age of Onset  . Diabetes Father   . Colon polyps Father   . Osteoarthritis Mother    . Hypertension Brother   . Cancer Maternal Uncle        leukemia  . Cancer Maternal Grandmother        unaware    Allergies  Allergen Reactions  . Adhesive [Tape] Other (See Comments)    Band-Aid caused pt to blister   . Penicillins Itching, Rash and Other (See Comments)    All over body. All deriatives    Medication list has been reviewed and updated.  Current Outpatient Medications on File Prior to Visit  Medication Sig Dispense Refill  . Cholecalciferol (VITAMIN D PO) Take 2,000 Units by mouth daily.     . Estradiol-Estriol-Progesterone (BIEST/PROGESTERONE) CREA Place onto the skin. 50/50 1 mg    . loratadine (CLARITIN) 10 MG tablet Take 10 mg by mouth daily as needed.     . Multiple Vitamin (MULTIVITAMIN) tablet Take 1 tablet by mouth every other day.     . Omega-3 Fatty Acids (FISH OIL) 1000 MG CAPS Take by mouth.    . progesterone (PROMETRIUM) 100 MG capsule Take 100 mg by mouth.  2  . psyllium (METAMUCIL) 58.6 % packet Take 1 packet daily as needed by mouth.     No current facility-administered medications on file prior to visit.     Review of Systems:  As per HPI- otherwise negative. No fever or chills   Physical Examination: Vitals:   08/25/17 1202  BP: 118/80  Pulse: 77  Resp: 16  SpO2: 98%   Vitals:   08/25/17 1202  Weight: 228 lb (103.4 kg)  Height: 5\' 8"  (1.727 m)   Body mass index is 34.67 kg/m. Ideal Body Weight: Weight in (lb) to have BMI = 25: 164.1  GEN: WDWN, NAD, Non-toxic, A & O x 3 HEENT: Atraumatic, Normocephalic. Neck supple. No masses, No LAD. Ears and Nose: No external deformity. CV: RRR, No M/G/R. No JVD. No thrill. No extra heart sounds. PULM: CTA B, no wheezes, crackles, rhonchi. No retractions. No resp. distress. No accessory muscle use. ABD: S, NT, ND, +BS. No rebound. No HSM. EXTR: No c/c/e NEURO Normal gait.  PSYCH: Normally interactive. Conversant. Not depressed or anxious appearing.  Calm demeanor.  She has mild medial  tenderness of the right elbow c/w medial epicondyliits  Normal ROM and strength of the left arm    Assessment and Plan: Chronic fatigue syndrome - Plan: Thyroglobulin Level, Thyroid peroxidase antibody, T4, free, T3, free, TSH, Zinc, Pregnenolone, DHEA-sulfate, Folate, Magnesium, Testosterone, Testosterone, free, Testosterone, % free, Sex hormone binding globulin, C-reactive protein, Homocysteine, Vitamin D (25 hydroxy), B12, Vitamin B6, Ferritin, Comprehensive metabolic panel, Hemoglobin A1c, CBC with Differential/Platelet,  Lipid panel, Insulin, Free (Bioactive)-(Quest)  Postartificial menopausal syndrome - Plan: Thyroglobulin Level, Thyroid peroxidase antibody, T4, free, T3, free, TSH, Zinc, Pregnenolone, DHEA-sulfate, Folate, Magnesium, Testosterone, Testosterone, free, Testosterone, % free, Sex hormone binding globulin, C-reactive protein, Homocysteine, Vitamin D (25 hydroxy), B12, Vitamin B6, Ferritin, Comprehensive metabolic panel, Hemoglobin A1c, CBC with Differential/Platelet, Lipid panel, Insulin, Free (Bioactive)-(Quest)  Need for prophylactic hormone replacement therapy (postmenopausal) - Plan: Thyroglobulin Level, Thyroid peroxidase antibody, T4, free, T3, free, TSH, Zinc, Pregnenolone, DHEA-sulfate, Folate, Magnesium, Testosterone, Testosterone, free, Testosterone, % free, Sex hormone binding globulin, C-reactive protein, Homocysteine, Vitamin D (25 hydroxy), B12, Vitamin B6, Ferritin, Comprehensive metabolic panel, Hemoglobin A1c, CBC with Differential/Platelet, Lipid panel, Insulin, Free (Bioactive)-(Quest)  Decreased libido - Plan: Thyroglobulin Level, Thyroid peroxidase antibody, T4, free, T3, free, TSH, Zinc, Pregnenolone, DHEA-sulfate, Folate, Magnesium, Testosterone, Testosterone, free, Testosterone, % free, Sex hormone binding globulin, C-reactive protein, Homocysteine, Vitamin D (25 hydroxy), B12, Vitamin B6, Ferritin, Comprehensive metabolic panel, Hemoglobin A1c, CBC with  Differential/Platelet, Lipid panel, Insulin, Free (Bioactive)-(Quest)  Vitamin disease - Plan: Thyroglobulin Level, Thyroid peroxidase antibody, T4, free, T3, free, TSH, Zinc, Pregnenolone, DHEA-sulfate, Folate, Magnesium, Testosterone, Testosterone, free, Testosterone, % free, Sex hormone binding globulin, C-reactive protein, Homocysteine, Vitamin D (25 hydroxy), B12, Vitamin B6, Ferritin, Comprehensive metabolic panel, Hemoglobin A1c, CBC with Differential/Platelet, Lipid panel, Insulin, Free (Bioactive)-(Quest)  Hypocalcemia - Plan: Thyroglobulin Level, Thyroid peroxidase antibody, T4, free, T3, free, TSH, Zinc, Pregnenolone, DHEA-sulfate, Folate, Magnesium, Testosterone, Testosterone, free, Testosterone, % free, Sex hormone binding globulin, C-reactive protein, Homocysteine, Vitamin D (25 hydroxy), B12, Vitamin B6, Ferritin, Comprehensive metabolic panel, Hemoglobin A1c, CBC with Differential/Platelet, Lipid panel, Insulin, Free (Bioactive)-(Quest)  Enlargement of thyroid - Plan: Thyroglobulin Level, Thyroid peroxidase antibody, T4, free, T3, free, TSH, Zinc, Pregnenolone, DHEA-sulfate, Folate, Magnesium, Testosterone, Testosterone, free, Testosterone, % free, Sex hormone binding globulin, C-reactive protein, Homocysteine, Vitamin D (25 hydroxy), B12, Vitamin B6, Ferritin, Comprehensive metabolic panel, Hemoglobin A1c, CBC with Differential/Platelet, Lipid panel, Insulin, Free (Bioactive)-(Quest)  Obesity, unspecified classification, unspecified obesity type, unspecified whether serious comorbidity present - Plan: Thyroglobulin Level, Thyroid peroxidase antibody, T4, free, T3, free, TSH, Zinc, Pregnenolone, DHEA-sulfate, Folate, Magnesium, Testosterone, Testosterone, free, Testosterone, % free, Sex hormone binding globulin, C-reactive protein, Homocysteine, Vitamin D (25 hydroxy), B12, Vitamin B6, Ferritin, Comprehensive metabolic panel, Hemoglobin A1c, CBC with Differential/Platelet, Lipid panel,  Insulin, Free (Bioactive)-(Quest)  Dysmetabolic syndrome X - Plan: Thyroglobulin Level, Thyroid peroxidase antibody, T4, free, T3, free, TSH, Zinc, Pregnenolone, DHEA-sulfate, Folate, Magnesium, Testosterone, Testosterone, free, Testosterone, % free, Sex hormone binding globulin, C-reactive protein, Homocysteine, Vitamin D (25 hydroxy), B12, Vitamin B6, Ferritin, Comprehensive metabolic panel, Hemoglobin A1c, CBC with Differential/Platelet, Lipid panel, Insulin, Free (Bioactive)-(Quest)  Medial epicondylitis of left elbow  Ordered many labs for pt as above. Will plan to fax some reports to her integrative MD for evaluation She is reassured about her elbow- for the time being she wishes to observe but will let me know if any worsening of her symptoms or if she wishes to do anything further   Signed Abbe Amsterdam, MD  Will fax all labs to Dr. Hansel Feinstein in [place] Her fax is [phone]

## 2017-08-25 ENCOUNTER — Encounter: Payer: Self-pay | Admitting: Family Medicine

## 2017-08-25 ENCOUNTER — Ambulatory Visit (INDEPENDENT_AMBULATORY_CARE_PROVIDER_SITE_OTHER): Payer: PRIVATE HEALTH INSURANCE | Admitting: Family Medicine

## 2017-08-25 VITALS — BP 118/80 | HR 77 | Resp 16 | Ht 68.0 in | Wt 228.0 lb

## 2017-08-25 DIAGNOSIS — R6882 Decreased libido: Secondary | ICD-10-CM | POA: Diagnosis not present

## 2017-08-25 DIAGNOSIS — IMO0002 Reserved for concepts with insufficient information to code with codable children: Secondary | ICD-10-CM

## 2017-08-25 DIAGNOSIS — R5382 Chronic fatigue, unspecified: Secondary | ICD-10-CM | POA: Diagnosis not present

## 2017-08-25 DIAGNOSIS — Z79890 Hormone replacement therapy (postmenopausal): Secondary | ICD-10-CM | POA: Diagnosis not present

## 2017-08-25 DIAGNOSIS — E569 Vitamin deficiency, unspecified: Secondary | ICD-10-CM

## 2017-08-25 DIAGNOSIS — E8881 Metabolic syndrome: Secondary | ICD-10-CM

## 2017-08-25 DIAGNOSIS — E049 Nontoxic goiter, unspecified: Secondary | ICD-10-CM | POA: Diagnosis not present

## 2017-08-25 DIAGNOSIS — N958 Other specified menopausal and perimenopausal disorders: Secondary | ICD-10-CM | POA: Diagnosis not present

## 2017-08-25 DIAGNOSIS — E669 Obesity, unspecified: Secondary | ICD-10-CM

## 2017-08-25 DIAGNOSIS — G9332 Myalgic encephalomyelitis/chronic fatigue syndrome: Secondary | ICD-10-CM

## 2017-08-25 DIAGNOSIS — M7702 Medial epicondylitis, left elbow: Secondary | ICD-10-CM

## 2017-08-25 LAB — CBC WITH DIFFERENTIAL/PLATELET
Basophils Absolute: 0.1 10*3/uL (ref 0.0–0.1)
Basophils Relative: 1.1 % (ref 0.0–3.0)
EOS PCT: 2.1 % (ref 0.0–5.0)
Eosinophils Absolute: 0.1 10*3/uL (ref 0.0–0.7)
HEMATOCRIT: 39.7 % (ref 36.0–46.0)
HEMOGLOBIN: 13.4 g/dL (ref 12.0–15.0)
LYMPHS PCT: 22.4 % (ref 12.0–46.0)
Lymphs Abs: 1.1 10*3/uL (ref 0.7–4.0)
MCHC: 33.7 g/dL (ref 30.0–36.0)
MCV: 93.6 fl (ref 78.0–100.0)
MONO ABS: 0.4 10*3/uL (ref 0.1–1.0)
Monocytes Relative: 8.3 % (ref 3.0–12.0)
NEUTROS ABS: 3.3 10*3/uL (ref 1.4–7.7)
Neutrophils Relative %: 66.1 % (ref 43.0–77.0)
PLATELETS: 233 10*3/uL (ref 150.0–400.0)
RBC: 4.24 Mil/uL (ref 3.87–5.11)
RDW: 13.4 % (ref 11.5–15.5)
WBC: 5.1 10*3/uL (ref 4.0–10.5)

## 2017-08-25 LAB — COMPREHENSIVE METABOLIC PANEL
ALT: 13 U/L (ref 0–35)
AST: 17 U/L (ref 0–37)
Albumin: 4.3 g/dL (ref 3.5–5.2)
Alkaline Phosphatase: 54 U/L (ref 39–117)
BILIRUBIN TOTAL: 0.6 mg/dL (ref 0.2–1.2)
BUN: 15 mg/dL (ref 6–23)
CALCIUM: 9.2 mg/dL (ref 8.4–10.5)
CHLORIDE: 107 meq/L (ref 96–112)
CO2: 26 mEq/L (ref 19–32)
CREATININE: 0.77 mg/dL (ref 0.40–1.20)
GFR: 81.26 mL/min (ref >60.00)
Glucose, Bld: 99 mg/dL (ref 70–99)
Potassium: 4 mEq/L (ref 3.5–5.1)
Sodium: 142 mEq/L (ref 135–145)
Total Protein: 6.5 g/dL (ref 6.0–8.3)

## 2017-08-25 LAB — VITAMIN B12: VITAMIN B 12: 375 pg/mL (ref 211–911)

## 2017-08-25 LAB — LIPID PANEL
CHOL/HDL RATIO: 3
Cholesterol: 184 mg/dL (ref 0–200)
HDL: 69.6 mg/dL (ref >39.00)
LDL CALC: 100 mg/dL — AB (ref 0–99)
NonHDL: 114.15
Triglycerides: 70 mg/dL (ref 0.0–149.0)
VLDL: 14 mg/dL (ref 0.0–40.0)

## 2017-08-25 LAB — MAGNESIUM: MAGNESIUM: 2 mg/dL (ref 1.5–2.5)

## 2017-08-25 LAB — VITAMIN D 25 HYDROXY (VIT D DEFICIENCY, FRACTURES): VITD: 24.78 ng/mL — AB (ref 30.00–100.00)

## 2017-08-25 LAB — TSH: TSH: 1.37 u[IU]/mL (ref 0.35–4.50)

## 2017-08-25 LAB — FERRITIN: FERRITIN: 33.4 ng/mL (ref 10.0–291.0)

## 2017-08-25 LAB — HEMOGLOBIN A1C: Hgb A1c MFr Bld: 5.7 % (ref 4.6–6.5)

## 2017-08-25 LAB — T4, FREE: FREE T4: 0.91 ng/dL (ref 0.60–1.60)

## 2017-08-25 LAB — TESTOSTERONE: Testosterone: 42.91 ng/dL — ABNORMAL HIGH (ref 15.00–40.00)

## 2017-08-25 LAB — FOLATE: Folate: 21.4 ng/mL (ref >5.9)

## 2017-08-25 LAB — T3, FREE: T3 FREE: 2.6 pg/mL (ref 2.3–4.2)

## 2017-08-25 LAB — C-REACTIVE PROTEIN: CRP: 0.1 mg/dL — ABNORMAL LOW (ref 0.5–20.0)

## 2017-08-25 NOTE — Patient Instructions (Signed)
I will look for your results and will fax to Dr. Lorenso CourierHagedorn for you.  Please note that I am not able to offer to interpret several of these labs which are not things that I would choose to order

## 2017-08-29 LAB — PREGNENOLONE: Pregnenolone: <10 ng/dL

## 2017-08-29 LAB — TESTOSTERONE, % FREE: TESTOSTERONE-% FREE: 0.6 %

## 2017-08-31 ENCOUNTER — Telehealth: Payer: Self-pay | Admitting: *Deleted

## 2017-08-31 LAB — HOMOCYSTEINE: Homocysteine: 11.5 umol/L — ABNORMAL HIGH (ref <10.4)

## 2017-08-31 LAB — VITAMIN B6: VITAMIN B6: 36.3 ng/mL — AB (ref 2.1–21.7)

## 2017-08-31 LAB — DHEA-SULFATE: DHEA-SO4: 112 ug/dL (ref 8–188)

## 2017-08-31 LAB — THYROID PEROXIDASE ANTIBODY: Thyroperoxidase Ab SerPl-aCnc: 3 IU/mL (ref <9)

## 2017-08-31 LAB — TESTOSTERONE, FREE: TESTOSTERONE FREE: 1.5 pg/mL (ref 0.2–5.0)

## 2017-08-31 LAB — THYROGLOBULIN LEVEL: Thyroglobulin: 2.3 ng/mL — ABNORMAL LOW

## 2017-08-31 LAB — SEX HORMONE BINDING GLOBULIN: SEX HORMONE BINDING: 52 nmol/L (ref 14–73)

## 2017-08-31 LAB — ZINC: ZINC: 75 ug/dL (ref 60–130)

## 2017-08-31 LAB — INSULIN, FREE (BIOACTIVE): INSULIN, FREE: 7.2 u[IU]/mL (ref 1.5–14.9)

## 2017-08-31 NOTE — Telephone Encounter (Signed)
Received Lab Report results from [facility]; forwarded to provider/SLS 07/16

## 2017-12-02 ENCOUNTER — Ambulatory Visit (INDEPENDENT_AMBULATORY_CARE_PROVIDER_SITE_OTHER): Payer: No Typology Code available for payment source | Admitting: Obstetrics & Gynecology

## 2017-12-02 ENCOUNTER — Other Ambulatory Visit: Payer: Self-pay

## 2017-12-02 ENCOUNTER — Encounter: Payer: Self-pay | Admitting: Obstetrics & Gynecology

## 2017-12-02 VITALS — BP 100/62 | HR 88 | Resp 14 | Ht 68.25 in | Wt 229.8 lb

## 2017-12-02 DIAGNOSIS — E2839 Other primary ovarian failure: Secondary | ICD-10-CM

## 2017-12-02 DIAGNOSIS — Z01419 Encounter for gynecological examination (general) (routine) without abnormal findings: Secondary | ICD-10-CM | POA: Diagnosis not present

## 2017-12-02 NOTE — Patient Instructions (Signed)
Outpatient Pharmacy at [facility] [address] [place],    [address]  Main: [phone] 

## 2017-12-02 NOTE — Progress Notes (Signed)
62 y.o. [id number] Married White or Caucasian female here for annual exam.  Had some concerns about Alzheimer's due to her father's history.  Read a book about Alzheimer's by Dr. Stefan Church.  Had brain MRI.  Has been treated with hormone replacement therapy and "fat-flammation" diet.  GI issues have significantly improved.  Has realized she likely has some gluten and lactose sensitivities.  Diet has changed significantly.  Seeing a provider in [place] who is ordering her hormonal therapy.  She is using biest/progesterone cream that is compounded.    Denies vaginal bleeding.  Bladder function is good.    She is down about 13 pounds since before surgery.    Had significant blood work done.    No LMP recorded. Patient has had a hysterectomy.          Sexually active: Yes.    The current method of family planning is post menopausal status.    Exercising: Yes.    mild - Swim, walk Smoker:  no  Health Maintenance: Pap:  03/06/16 Neg. HR HPV:neg  09/18/13 Neg History of abnormal Pap:  no MMG:  03/19/17 BIRADS1:Neg Colonoscopy:  03/11/15 normal BMD:   Never TDaP:  2012 Pneumonia vaccine(s):  n/a Shingrix:   D/w pt today Hep C testing: 03/06/16 Neg Screening Labs: Here   reports that she has never smoked. She has never used smokeless tobacco. She reports that she drinks about 1.0 standard drinks of alcohol per week. She reports that she does not use drugs.  Past Medical History:  Diagnosis Date  . Anal fissure   . Arthritis   . Biliary colic 5/96   ERCP w/spincterotomy  . Condyloma   . Fatigue    loss of sleep  . Generalized headaches   . GERD (gastroesophageal reflux disease)   . Hemorrhoid   . Hiatal hernia   . Migraines   . Obesity   . Phlebitis of leg   . PONV (postoperative nausea and vomiting)   . Primary osteoarthritis of both knees   . Stress incontinence       . Wilson's disease     Past Surgical History:  Procedure Laterality Date  . ANAL FISSURECTOMY  1986  .  ANTERIOR AND POSTERIOR REPAIR N/A 11/24/2016   Procedure: ANTERIOR (CYSTOCELE) AND POSTERIOR REPAIR (RECTOCELE);  Surgeon: Alfredo Martinez, MD;  Location: [facility];  Service: Urology;  Laterality: N/A;  . BUNIONECTOMY Bilateral 2005  . CESAREAN SECTION  1990   prolapsed cord  . CYSTOSCOPY N/A 11/24/2016   Procedure: CYSTOSCOPY;  Surgeon: Alfredo Martinez, MD;  Location: [facility];  Service: Urology;  Laterality: N/A;  . ENDOVENOUS ABLATION SAPHENOUS VEIN W/ LASER  2008   both lower legs  . LAPAROSCOPIC CHOLECYSTECTOMY  1997  . LAPAROSCOPIC GASTRIC SLEEVE RESECTION WITH HIATAL HERNIA REPAIR  09/16/2010  . LAPAROSCOPIC VAGINAL HYSTERECTOMY WITH SALPINGECTOMY Bilateral 11/24/2016   Procedure: LAPAROSCOPIC ASSISTED VAGINAL HYSTERECTOMY WITH SALPINGO-OOPHORECTOMY;  Surgeon: [name], MD;  Location: [facility];  Service: Gynecology;  Laterality: Bilateral;    Current Outpatient Medications  Medication Sig Dispense Refill  . b complex vitamins capsule Take 1 capsule by mouth daily.    . Cholecalciferol (VITAMIN D PO) Take 2,000 Units by mouth daily.     . Estradiol-Estriol-Progesterone (BIEST/PROGESTERONE) CREA Place onto the skin. 50/50 1 mg    . loratadine (CLARITIN) 10 MG tablet Take 10 mg by mouth daily as needed.     . Multiple Vitamin (MULTIVITAMIN) tablet Take 1 tablet by mouth every  other day.     . Omega-3 Fatty Acids (FISH OIL) 1000 MG CAPS Take by mouth.    . progesterone (PROMETRIUM) 100 MG capsule Take 100 mg by mouth.  2  . psyllium (METAMUCIL) 58.6 % packet Take 1 packet daily as needed by mouth.     No current facility-administered medications for this visit.     Family History  Problem Relation Age of Onset  . Diabetes Father   . Colon polyps Father   . Osteoarthritis Mother   . Hypertension Brother   . Cancer Maternal Uncle        leukemia  . Cancer Maternal Grandmother        unaware    Review of Systems  Genitourinary: Positive for urgency.       Loss of sexual  interest   All other systems reviewed and are negative.   Exam:   BP 100/62 (BP Location: Right Arm, Patient Position: Sitting, Cuff Size: Large)   Pulse 88   Resp 14   Ht 5' 8.25" (1.734 m)   Wt 229 lb 12.8 oz (104.2 kg)   BMI 34.69 kg/m    Height: 5' 8.25" (173.4 cm)  Ht Readings from Last 3 Encounters:  12/02/17 5' 8.25" (1.734 m)  08/25/17 5\' 8"  (1.727 m)  06/10/17 5' 8.25" (1.734 m)    General appearance: alert, cooperative and appears stated age Head: Normocephalic, without obvious abnormality, atraumatic Neck: no adenopathy, supple, symmetrical, trachea midline and thyroid normal to inspection and palpation Lungs: clear to auscultation bilaterally Breasts: normal appearance, no masses or tenderness Heart: regular rate and rhythm Abdomen: soft, non-tender; bowel sounds normal; no masses,  no organomegaly Extremities: extremities normal, atraumatic, no cyanosis or edema Skin: Skin color, texture, turgor normal. No rashes or lesions Lymph nodes: Cervical, supraclavicular, and axillary nodes normal. No abnormal inguinal nodes palpated Neurologic: Grossly normal   Pelvic: External genitalia:  no lesions              Urethra:  normal appearing urethra with no masses, tenderness or lesions              Bartholins and Skenes: normal                 Vagina: normal appearing vagina with normal color and discharge, no lesions              Cervix: absent              Pap taken: No. Bimanual Exam:  Uterus:  uterus absent              Adnexa: normal adnexa and no mass, fullness, tenderness               Rectovaginal: Confirms               Anus:  normal sphincter tone, no lesions  Chaperone was present for exam.  A:  Well Woman with normal exam PMP, on HRT (getting this from integrative provider in [place], [place]) Family hx of Alzheimer's in her father H/O LAVH/bilateral salpingectomy due to cystocele and rectocele  P:   Mammogram guidelines reviewed pap smear not  indicated Blood work done this year Shingrix vaccination discussed and information given return annually or prn

## 2017-12-17 MED FILL — PROGESTERONE 100 MG CAPSULE: 100 | 60 days supply | Qty: 120 | Fill #1

## 2018-04-15 NOTE — Progress Notes (Addendum)
[facility] [address] [place], [place] [address] [phone]  Date:  04/18/2018   Name:  Kathryn Pineda   DOB:  08-18-1955   MRN:  [id number]  PCP:  Pearline Cables, MD    Chief Complaint: Rash (rash on right lower leg, redness, itching, off and on 3 months ago. )   History of Present Illness:  Kathryn Pineda is a 63 y.o. very pleasant female patient who presents with the following:  History of obesity, lap band Here today for a follow-up visit and to discuss a rash  Last seen by myself in July - at that time she wanted me to draw extensive alternative labs that were requested by her integrative health provider - Dr. Hansel Feinstein in [place]   Pap is UTD mammo one year ago- reminded her about this, she will schedule asap Flu shot: done per [place], in the fall  Shingrix: she is interested in getting this, however unfortunately we are out of this immunization today  Asked about HIV screening, she feels certain that she has been screened in the past during 1 of her 4 pregnancies.  We did check a CMP, CBC and lipids in July   She has noted a rash on her right shin which has been present for approximately 8 years.  It will come and go.  She has been to see dermatology in the past, but has not had a biopsy. It is itchy, seems to be worse when she is exposed to any allergen.  May be more itchy when exposed to heat.  She notes that she had allergy testing and was allergic to milk-she is trying to avoid milk, and hopes that it will help with her rash.  Patient Active Problem List   Diagnosis Date Noted  . Rectocele 11/13/2016  . Incomplete uterine prolapse 11/13/2016  . Cystocele, midline 11/13/2016  . Seasonal allergies 09/17/2014  . Incontinence   . Obesity (BMI 30-39.9) 12/24/2010  . Hx of laparoscopic gastric banding 09/23/2010    Past Medical History:  Diagnosis Date  . Anal fissure   .  Arthritis   . Biliary colic 5/96   ERCP w/spincterotomy  . Condyloma   . Fatigue    loss of sleep  . Generalized headaches   . GERD (gastroesophageal reflux disease)   . Hemorrhoid   . Hiatal hernia   . Migraines   . Obesity   . Phlebitis of leg   . PONV (postoperative nausea and vomiting)   . Primary osteoarthritis of both knees   . Stress incontinence   . Wilson's disease     Past Surgical History:  Procedure Laterality Date  . ANAL FISSURECTOMY  1986  . ANTERIOR AND POSTERIOR REPAIR N/A 11/24/2016   Procedure: ANTERIOR (CYSTOCELE) AND POSTERIOR REPAIR (RECTOCELE);  Surgeon: Alfredo Martinez, MD;  Location: [facility];  Service: Urology;  Laterality: N/A;  . BUNIONECTOMY Bilateral 2005  . CESAREAN SECTION  1990   prolapsed cord  . CYSTOSCOPY N/A 11/24/2016   Procedure: CYSTOSCOPY;  Surgeon: Alfredo Martinez, MD;  Location: [facility];  Service: Urology;  Laterality: N/A;  . ENDOVENOUS ABLATION SAPHENOUS VEIN W/ LASER  2008   both lower legs  . LAPAROSCOPIC CHOLECYSTECTOMY  1997  . LAPAROSCOPIC GASTRIC SLEEVE RESECTION WITH HIATAL HERNIA REPAIR  09/16/2010  . LAPAROSCOPIC VAGINAL HYSTERECTOMY WITH SALPINGECTOMY Bilateral 11/24/2016   Procedure: LAPAROSCOPIC ASSISTED VAGINAL HYSTERECTOMY WITH SALPINGO-OOPHORECTOMY;  Surgeon: Jerene BearsMiller, Mary S, MD;  Location: [facility];  Service: Gynecology;  Laterality: Bilateral;    Social History   Tobacco Use  . Smoking status: Never Smoker  . Smokeless tobacco: Never Used  Substance Use Topics  . Alcohol use: Yes    Alcohol/week: 1.0 standard drinks    Types: 1 Glasses of wine per week  . Drug use: No    Family History  Problem Relation Age of Onset  . Diabetes Father   . Colon polyps Father   . Osteoarthritis Mother   . Hypertension Brother   . Cancer Maternal Uncle        leukemia  . Cancer Maternal Grandmother        unaware    Allergies  Allergen Reactions  . Adhesive [Tape] Other (See Comments)    Band-Aid caused pt to  blister   . Penicillins Itching, Rash and Other (See Comments)    All over body. All deriatives    Medication list has been reviewed and updated.  Current Outpatient Medications on File Prior to Visit  Medication Sig Dispense Refill  . b complex vitamins capsule Take 1 capsule by mouth daily.    . Cholecalciferol (VITAMIN D PO) Take 2,000 Units by mouth daily.     . Estradiol-Estriol-Progesterone (BIEST/PROGESTERONE) CREA Place onto the skin. 50/50 1 mg    . loratadine (CLARITIN) 10 MG tablet Take 10 mg by mouth daily as needed.     . Multiple Vitamin (MULTIVITAMIN) tablet Take 1 tablet by mouth every other day.     . Omega-3 Fatty Acids (FISH OIL) 1000 MG CAPS Take by mouth.    . progesterone (PROMETRIUM) 100 MG capsule Take 100 mg by mouth.  2  . psyllium (METAMUCIL) 58.6 % packet Take 1 packet daily as needed by mouth.     No current facility-administered medications on file prior to visit.     Review of Systems:  As per HPI- otherwise negative. No fever or chills She feels that her memory sx and "leaky gut" are better now   Physical Examination: Vitals:   04/18/18 0812  BP: 126/80  Pulse: 64  Resp: 16  Temp: 98.1 F (36.7 C)  SpO2: 98%   Vitals:   04/18/18 0812  Weight: 227 lb (103 kg)  Height: 5\' 8"  (1.727 m)   Body mass index is 34.52 kg/m. Ideal Body Weight: Weight in (lb) to have BMI = 25: 164.1  GEN: WDWN, NAD, Non-toxic, A & O x 3, looks well. HEENT: Atraumatic, Normocephalic. Neck supple. No masses, No LAD. Ears and Nose: No external deformity. CV: RRR, No M/G/R. No JVD. No thrill. No extra heart sounds. PULM: CTA B, no wheezes, crackles, rhonchi. No retractions. No resp. distress. No accessory muscle use. EXTR: No c/c/e NEURO Normal gait.  PSYCH: Normally interactive. Conversant. Not depressed or anxious appearing.  Calm demeanor.  Right anterior calf: There is a confluence maculopapular patch over the anterior mid shin.  The border is precise.  It is  mildly scaly, appears consistent with a spongiotic dermatitis foot exam normal.  Palpable pulses in both feet, ischemia   Assessment and Plan: Spongiotic dermatitis - Plan: CBC with Differential/Platelet, Comprehensive metabolic panel, triamcinolone ointment (KENALOG) 0.1 %  Screening for breast cancer - Plan: MM 3D SCREEN BREAST BILATERAL  Rash - Plan: triamcinolone ointment (KENALOG) 0.1 %  Postartificial menopausal syndrome - Plan: DHEA-sulfate, Pregnenolone, Progesterone, Estradiol, Testosterone, Testosterone, free, Sex hormone binding globulin  Vitamin disease - Plan: Vitamin  D (25 hydroxy), Magnesium  Dysmetabolic syndrome X - Plan: Homocysteine, Ferritin  Family history of Alzheimer's disease  Screening for diabetes mellitus - Plan: Hemoglobin A1c  Here today for recheck visit.  Her main concern is a rash on her shin.  She notes this rash has been present for approximately 8 years, will wax and wane.  She is seen dermatology in the past who recommended a topical steroid.  However this is now expired. Offered to do a biopsy today, but she declines. Prescription for triamcinolone ointment given, use pulse therapy- use treatment when rash is active, can stop when rash is quiet Ordered mammogram Ordered a battery of lab tests as requested by her integrative physician, as above.  Signed Abbe Amsterdam, MD  Received her labs so far Results for orders placed or performed in visit on 04/18/18  CBC with Differential/Platelet  Result Value Ref Range   WBC 4.1 4.0 - 10.5 K/uL   RBC 4.29 3.87 - 5.11 Mil/uL   Hemoglobin 13.6 12.0 - 15.0 g/dL   HCT 72.9 02.1 - 11.5 %   MCV 93.6 78.0 - 100.0 fl   MCHC 33.9 30.0 - 36.0 g/dL   RDW 52.0 80.2 - 23.3 %   Platelets 207.0 150.0 - 400.0 K/uL   Neutrophils Relative % 60.6 43.0 - 77.0 %   Lymphocytes Relative 23.3 12.0 - 46.0 %   Monocytes Relative 10.3 3.0 - 12.0 %   Eosinophils Relative 4.1 0.0 - 5.0 %   Basophils Relative 1.7 0.0 -  3.0 %   Neutro Abs 2.5 1.4 - 7.7 K/uL   Lymphs Abs 0.9 0.7 - 4.0 K/uL   Monocytes Absolute 0.4 0.1 - 1.0 K/uL   Eosinophils Absolute 0.2 0.0 - 0.7 K/uL   Basophils Absolute 0.1 0.0 - 0.1 K/uL  Comprehensive metabolic panel  Result Value Ref Range   Sodium 143 135 - 145 mEq/L   Potassium 4.4 3.5 - 5.1 mEq/L   Chloride 106 96 - 112 mEq/L   CO2 29 19 - 32 mEq/L   Glucose, Bld 98 70 - 99 mg/dL   BUN 10 6 - 23 mg/dL   Creatinine, Ser 6.12 0.40 - 1.20 mg/dL   Total Bilirubin 0.7 0.2 - 1.2 mg/dL   Alkaline Phosphatase 53 39 - 117 U/L   AST 15 0 - 37 U/L   ALT 11 0 - 35 U/L   Total Protein 6.4 6.0 - 8.3 g/dL   Albumin 4.3 3.5 - 5.2 g/dL   Calcium 9.2 8.4 - 24.4 mg/dL   GFR 97.53 >00.51 mL/min  Vitamin D (25 hydroxy)  Result Value Ref Range   VITD 36.63 30.00 - 100.00 ng/mL  Ferritin  Result Value Ref Range   Ferritin 39.8 10.0 - 291.0 ng/mL  Testosterone  Result Value Ref Range   Testosterone 61.32 (H) 15.00 - 40.00 ng/dL  Magnesium  Result Value Ref Range   Magnesium 2.1 1.5 - 2.5 mg/dL  Hemoglobin T0Y  Result Value Ref Range   Hgb A1c MFr Bld 5.5 4.6 - 6.5 %   Message to patient

## 2018-04-18 ENCOUNTER — Encounter: Payer: Self-pay | Admitting: Family Medicine

## 2018-04-18 ENCOUNTER — Ambulatory Visit (INDEPENDENT_AMBULATORY_CARE_PROVIDER_SITE_OTHER): Payer: No Typology Code available for payment source | Admitting: Family Medicine

## 2018-04-18 VITALS — BP 126/80 | HR 64 | Temp 98.1°F | Resp 16 | Ht 68.0 in | Wt 227.0 lb

## 2018-04-18 DIAGNOSIS — N958 Other specified menopausal and perimenopausal disorders: Secondary | ICD-10-CM | POA: Diagnosis not present

## 2018-04-18 DIAGNOSIS — E8881 Metabolic syndrome: Secondary | ICD-10-CM | POA: Diagnosis not present

## 2018-04-18 DIAGNOSIS — L308 Other specified dermatitis: Secondary | ICD-10-CM

## 2018-04-18 DIAGNOSIS — Z1239 Encounter for other screening for malignant neoplasm of breast: Secondary | ICD-10-CM

## 2018-04-18 DIAGNOSIS — E569 Vitamin deficiency, unspecified: Secondary | ICD-10-CM | POA: Diagnosis not present

## 2018-04-18 DIAGNOSIS — IMO0002 Reserved for concepts with insufficient information to code with codable children: Secondary | ICD-10-CM

## 2018-04-18 DIAGNOSIS — Z131 Encounter for screening for diabetes mellitus: Secondary | ICD-10-CM | POA: Diagnosis not present

## 2018-04-18 DIAGNOSIS — Z820 Family history of epilepsy and other diseases of the nervous system: Secondary | ICD-10-CM

## 2018-04-18 DIAGNOSIS — R21 Rash and other nonspecific skin eruption: Secondary | ICD-10-CM

## 2018-04-18 LAB — VITAMIN D 25 HYDROXY (VIT D DEFICIENCY, FRACTURES): VITD: 36.63 ng/mL (ref 30.00–100.00)

## 2018-04-18 LAB — FERRITIN: Ferritin: 39.8 ng/mL (ref 10.0–291.0)

## 2018-04-18 LAB — CBC WITH DIFFERENTIAL/PLATELET
BASOS ABS: 0.1 10*3/uL (ref 0.0–0.1)
Basophils Relative: 1.7 % (ref 0.0–3.0)
EOS PCT: 4.1 % (ref 0.0–5.0)
Eosinophils Absolute: 0.2 10*3/uL (ref 0.0–0.7)
HEMATOCRIT: 40.2 % (ref 36.0–46.0)
Hemoglobin: 13.6 g/dL (ref 12.0–15.0)
LYMPHS PCT: 23.3 % (ref 12.0–46.0)
Lymphs Abs: 0.9 10*3/uL (ref 0.7–4.0)
MCHC: 33.9 g/dL (ref 30.0–36.0)
MCV: 93.6 fl (ref 78.0–100.0)
MONOS PCT: 10.3 % (ref 3.0–12.0)
Monocytes Absolute: 0.4 10*3/uL (ref 0.1–1.0)
NEUTROS ABS: 2.5 10*3/uL (ref 1.4–7.7)
Neutrophils Relative %: 60.6 % (ref 43.0–77.0)
PLATELETS: 207 10*3/uL (ref 150.0–400.0)
RBC: 4.29 Mil/uL (ref 3.87–5.11)
RDW: 13 % (ref 11.5–15.5)
WBC: 4.1 10*3/uL (ref 4.0–10.5)

## 2018-04-18 LAB — COMPREHENSIVE METABOLIC PANEL
ALK PHOS: 53 U/L (ref 39–117)
ALT: 11 U/L (ref 0–35)
AST: 15 U/L (ref 0–37)
Albumin: 4.3 g/dL (ref 3.5–5.2)
BILIRUBIN TOTAL: 0.7 mg/dL (ref 0.2–1.2)
BUN: 10 mg/dL (ref 6–23)
CALCIUM: 9.2 mg/dL (ref 8.4–10.5)
CO2: 29 mEq/L (ref 19–32)
Chloride: 106 mEq/L (ref 96–112)
Creatinine, Ser: 0.82 mg/dL (ref 0.40–1.20)
GFR: 70.95 mL/min (ref >60.00)
Glucose, Bld: 98 mg/dL (ref 70–99)
Potassium: 4.4 mEq/L (ref 3.5–5.1)
Sodium: 143 mEq/L (ref 135–145)
Total Protein: 6.4 g/dL (ref 6.0–8.3)

## 2018-04-18 LAB — HEMOGLOBIN A1C: Hgb A1c MFr Bld: 5.5 % (ref 4.6–6.5)

## 2018-04-18 LAB — MAGNESIUM: Magnesium: 2.1 mg/dL (ref 1.5–2.5)

## 2018-04-18 LAB — TESTOSTERONE: TESTOSTERONE: 61.32 ng/dL — AB (ref 15.00–40.00)

## 2018-04-18 MED ORDER — TRIAMCINOLONE ACETONIDE 0.1 % EX OINT: 1.0000 "application " | TOPICAL_OINTMENT | Freq: Two times a day (BID) | CUTANEOUS | 2 refills | Status: DC

## 2018-04-18 MED FILL — TRIAMCINOLONE 0.1% OINTMEN: 0.1 | 30 days supply | Qty: 60 | Fill #0

## 2018-04-18 NOTE — Patient Instructions (Signed)
I will be in touch with your labs asap I ordered a mammogram for you to have done at your convenience We sent in an rx for triamcinolone ointment- use twice a day as needed for your rash Use an emollient such as aquaphor in between treatment as well Use "pulse therapy" as we discussed Let me know if ointment is not helpful for you   I'm sorry that we are out of shingrix today!  We are glad to administer at your next appt

## 2018-04-19 ENCOUNTER — Ambulatory Visit
Admission: RE | Admit: 2018-04-19 | Discharge: 2018-04-19 | Disposition: A | Discharge status: Home / Self Care | Payer: No Typology Code available for payment source | Source: Ambulatory Visit | Attending: Family Medicine | Admitting: Family Medicine

## 2018-04-19 DIAGNOSIS — Z1239 Encounter for other screening for malignant neoplasm of breast: Secondary | ICD-10-CM

## 2018-04-20 LAB — HOMOCYSTEINE: Homocysteine: 12.5 umol/L — ABNORMAL HIGH (ref <10.4)

## 2018-04-21 LAB — TESTOSTERONE, FREE: TESTOSTERONE FREE: 3.7 pg/mL (ref 0.2–5.0)

## 2018-04-21 LAB — PROGESTERONE: Progesterone: 1.7 ng/mL

## 2018-04-21 LAB — DHEA-SULFATE: DHEA-SO4: 84 ug/dL (ref 8–188)

## 2018-04-21 LAB — SEX HORMONE BINDING GLOBULIN: Sex Hormone Binding: 68 nmol/L (ref 14–73)

## 2018-04-21 LAB — ESTRADIOL: Estradiol: 19 pg/mL

## 2018-04-23 ENCOUNTER — Telehealth: Payer: Self-pay | Admitting: Family Medicine

## 2018-04-23 LAB — PREGNENOLONE: PREGNENOLONE: 36 ng/dL

## 2018-04-23 NOTE — Telephone Encounter (Signed)
Do we know why this order was canceled?  Thank you!

## 2018-04-25 NOTE — Telephone Encounter (Signed)
Never mind it came in!

## 2018-04-25 NOTE — Telephone Encounter (Signed)
I'm not sure which order was cancelled?

## 2018-05-09 MED FILL — PROGESTERONE 100 MG CAPSULE: 100 | 60 days supply | Qty: 120 | Fill #2

## 2018-05-31 ENCOUNTER — Encounter: Payer: Self-pay | Admitting: Family Medicine

## 2018-05-31 DIAGNOSIS — Z5181 Encounter for therapeutic drug level monitoring: Secondary | ICD-10-CM

## 2018-06-01 ENCOUNTER — Other Ambulatory Visit: Payer: Self-pay

## 2018-06-01 ENCOUNTER — Other Ambulatory Visit (INDEPENDENT_AMBULATORY_CARE_PROVIDER_SITE_OTHER): Payer: No Typology Code available for payment source

## 2018-06-01 DIAGNOSIS — Z5181 Encounter for therapeutic drug level monitoring: Secondary | ICD-10-CM

## 2018-06-02 LAB — TIQ-NTM

## 2018-06-02 LAB — TESTOSTERONE, FREE

## 2018-06-03 ENCOUNTER — Telehealth: Payer: Self-pay

## 2018-06-03 NOTE — Telephone Encounter (Signed)
Copied from CRM [id number]. Topic: Appointment Scheduling - Scheduling Inquiry for Clinic >> May 31, 2018  5:41 PM Marylen Ponto wrote: Reason for CRM: Pt would like to schedule lab appt. Pt requests call back. Cb# [phone]

## 2018-06-03 NOTE — Telephone Encounter (Signed)
Sent patient mychart message. Please see message.

## 2018-06-06 ENCOUNTER — Other Ambulatory Visit: Payer: Self-pay | Admitting: Emergency Medicine

## 2018-06-06 DIAGNOSIS — Z5181 Encounter for therapeutic drug level monitoring: Secondary | ICD-10-CM

## 2018-06-06 NOTE — Telephone Encounter (Signed)
Pt will call next week to r/s

## 2018-09-27 MED FILL — PROGESTERONE 100 MG CAPSULE: 100 | 60 days supply | Qty: 120 | Fill #0

## 2019-01-06 ENCOUNTER — Telehealth: Payer: No Typology Code available for payment source | Admitting: Physician Assistant

## 2019-01-06 DIAGNOSIS — H019 Unspecified inflammation of eyelid: Secondary | ICD-10-CM

## 2019-01-06 MED ORDER — SULFAMETHOXAZOLE-TRIMETHOPRIM 800-160 MG PO TABS: 1.0000 | ORAL_TABLET | Freq: Two times a day (BID) | ORAL | 0 refills | Status: AC

## 2019-01-06 MED FILL — SULFAMETHOXAZOLE-TMP DS TAB: 800-160 | 7 days supply | Qty: 14 | Fill #0

## 2019-01-06 NOTE — Progress Notes (Signed)
E Visit for Cellulitis  We are sorry that you are not feeling well. Here is how we plan to help!  Based on what you shared with me it looks like you have cellulitis.  Cellulitis looks like areas of skin redness, swelling, and warmth; it develops as a result of bacteria entering under the skin. Little red spots and/or bleeding can be seen in skin, and tiny surface sacs containing fluid can occur. Fever can be present. Cellulitis is almost always on one side of a body, and the lower limbs are the most common site of involvement.   I have prescribed:  Bactrim DS 1 tablet by mouth twice a day for 7 days  HOME CARE:  Take your medications as ordered and take all of them, even if the skin irritation appears to be healing.   GET HELP RIGHT AWAY IF:  Symptoms that don't begin to go away within 48 hours. Severe redness persists or worsens If the area turns color, spreads or swells. If it blisters and opens, develops yellow-brown crust or bleeds. You develop a fever or chills. If the pain increases or becomes unbearable.  Are unable to keep fluids and food down.  MAKE SURE YOU   Understand these instructions. Will watch your condition. Will get help right away if you are not doing well or get worse.  Thank you for choosing an e-visit.  Your e-visit answers were reviewed by a board certified advanced clinical practitioner to complete your personal care plan. Depending upon the condition, your plan could have included both over the counter or prescription medications.  Please review your pharmacy choice. Make sure the pharmacy is open so you can pick up prescription now. If there is a problem, you may contact your provider through MyChart messaging and have the prescription routed to another pharmacy.  Your safety is important to us. If you have drug allergies check your prescription carefully.   For the next 24 hours you can use MyChart to ask questions about today's visit, request a  non-urgent call back, or ask for a work or school excuse. You will get an email in the next two days asking about your experience. I hope that your e-visit has been valuable and will speed your recovery.   Greater than 5 minutes, yet less than 10 minutes of time have been spent researching, coordinating and implementing care for this patient today.   

## 2019-01-18 MED FILL — PROGESTERONE 100 MG CAPSULE: 100 | 60 days supply | Qty: 120 | Fill #1

## 2019-02-15 ENCOUNTER — Ambulatory Visit (HOSPITAL_BASED_OUTPATIENT_CLINIC_OR_DEPARTMENT_OTHER)
Admission: RE | Admit: 2019-02-15 | Discharge: 2019-02-15 | Disposition: A | Discharge status: Home / Self Care | Payer: No Typology Code available for payment source | Source: Ambulatory Visit | Attending: Family Medicine | Admitting: Family Medicine

## 2019-02-15 ENCOUNTER — Encounter: Payer: Self-pay | Admitting: Family Medicine

## 2019-02-15 ENCOUNTER — Ambulatory Visit (INDEPENDENT_AMBULATORY_CARE_PROVIDER_SITE_OTHER): Payer: No Typology Code available for payment source | Admitting: Family Medicine

## 2019-02-15 ENCOUNTER — Other Ambulatory Visit: Payer: Self-pay

## 2019-02-15 VITALS — BP 120/70 | HR 84 | Temp 96.4°F | Ht 68.5 in | Wt 227.2 lb

## 2019-02-15 DIAGNOSIS — M79661 Pain in right lower leg: Secondary | ICD-10-CM

## 2019-02-15 DIAGNOSIS — I824Z1 Acute embolism and thrombosis of unspecified deep veins of right distal lower extremity: Secondary | ICD-10-CM | POA: Diagnosis not present

## 2019-02-15 DIAGNOSIS — M7989 Other specified soft tissue disorders: Secondary | ICD-10-CM

## 2019-02-15 MED ORDER — RIVAROXABAN 20 MG PO TABS: 20.0000 mg | ORAL_TABLET | Freq: Every day | ORAL | 2 refills | Status: DC

## 2019-02-15 MED FILL — XARELTO 20 MG TABLET: 20 | 30 days supply | Qty: 30 | Fill #0

## 2019-02-15 NOTE — Progress Notes (Addendum)
Chief Complaint  Patient presents with  . Leg Pain    right  . Leg Swelling    Kathryn Pineda here for right leg swelling.  Duration: 1 week Hx of prolonged bedrest, recent surgery, travel or injury? Yes; stumbled on ice 5 d ago.  Pain the calf? Yes SOB? No Personal or family history of clot or bleeding disorder? No No bruising or sob.  ROS:  MSK- +leg swelling, +calf pain Lungs- no SOB  Past Medical History:  Diagnosis Date  . Anal fissure   . Arthritis   . Biliary colic 5/68   ERCP w/spincterotomy  . Condyloma   . Fatigue    loss of sleep  . Generalized headaches   . GERD (gastroesophageal reflux disease)   . Hemorrhoid   . Hiatal hernia   . Migraines   . Obesity   . Phlebitis of leg   . PONV (postoperative nausea and vomiting)   . Primary osteoarthritis of both knees   . Stress incontinence   . Wilson's disease    Family History  Problem Relation Age of Onset  . Diabetes Father   . Colon polyps Father   . Osteoarthritis Mother   . Hypertension Brother   . Cancer Maternal Uncle        leukemia  . Cancer Maternal Grandmother        unaware  . Breast cancer Neg Hx    BP 120/70 (BP Location: Left Arm, Patient Position: Sitting, Cuff Size: Normal)   Pulse 84   Temp (!) 96.4 F (35.8 C) (Temporal)   Ht 5' 8.5" (1.74 m)   Wt 227 lb 4 oz (103.1 kg)   SpO2 98%   BMI 34.05 kg/m  Gen- awake, alert, appears stated age Heart- +LE edema on R Lungs- no accessory muscle use MSK- +R calf pain Psych: Age appropriate judgment and insight  Right calf pain - Plan: US Venous Img Lower Unilateral Right  Leg swelling  US shows DVT. Will start Xarelto given hx suggestive of acute process around 1 week ago. 15 mg samples to be taken bid for 3 weeks given today. 20 mg/d rx sent to pharmacy. She will reach out to her GYN regarding her topical estrogen cream and if that was contributory. If not, will consider heme referral for determination of duration of DVT and  hypercoag work up. I will have her f/u with Dr. Lorelei Pont in 6 weeks to reck.  Pt voiced understanding and agreement to the plan.  [place], DO 02/15/19  4:51 PM

## 2019-02-15 NOTE — Addendum Note (Signed)
Addended by: Ames Coupe on: 02/15/2019 03:33 PM   Modules accepted: Orders

## 2019-02-15 NOTE — Patient Instructions (Addendum)
If there is a clot: you will have to answer from a blocked number should this ultrasound show a clot. We will discuss the next steps over the phone. No news is good news.   If Ultrasound is normal:  Ice/cold pack over area for 10-15 min twice daily.  Heat (pad or rice pillow in microwave) over affected area, 10-15 minutes twice daily.   OK to take Tylenol 1000 mg (2 extra strength tabs) or 975 mg (3 regular strength tabs) every 6 hours as needed.  Stretching and range of motion exercises These exercises warm up your muscles and joints and improve the movement and flexibility of your lower leg. These exercises also help to relieve pain and stiffness.  Exercise A: Gastrocnemius stretch 1. Sit with your left / right leg extended. 2. Loop a belt or towel around the ball of your left / right foot. The ball of your foot is on the walking surface, right under your toes. 3. Hold both ends of the belt or towel. 4. Keep your left / right ankle and foot relaxed and keep your knee straight while you use the belt or towel to pull your foot and ankle toward you. Stop at the first point of resistance. 5. Hold this position for 30 seconds. Repeat 2 times. Complete this exercise 3 times per week.  Exercise B: Ankle alphabet 1. Sit with your left / right leg supported at the lower leg. ? Do not rest your foot on anything. ? Make sure your foot has room to move freely. 2. Think of your left / right foot as a paintbrush, and move your foot to trace each letter of the alphabet in the air. Keep your hip and knee still while you trace. 3. Trace every letter from A to Z. Repeat 2 times. Complete this exercise 3 times per week.  Strengthening exercises These exercises build strength and endurance in your lower leg. Endurance is the ability to use your muscles for a long time, even after they get tired.  Exercise C: Plantar flexors with band 1. Sit with your left / right leg extended. 2. Loop a rubber  exercise band or tube around the ball of your left / right foot. The ball of your foot is on the walking surface, right under your toes. 3. While holding both ends of the band or tube, slowly point your toes downward, pushing them away from you. 4. Hold this position for 3 seconds. 5. Slowly return your foot to the starting position and repeat for a total of 10 repetitions. Repeat 2 times. Complete this exercise 3 times per week.  Exercise D: Plantar flexors, standing 1. Stand with your feet shoulder-width apart. 2. Place your hands on a wall or table to steady yourself as needed, but try not to use it very much for support. 3. Rise up on your toes. 4. If this exercise is too easy, try these options: ? Shift your weight toward your left / right leg until you feel challenged. ? If told by your health care provider, stand on your left / right foot only. 5. Hold this position for 3 seconds. 6. Repeat for a total of 10 repetitions. Repeat 2 times. Complete this exercise 3 times per week.  Exercise E: Plantar flexors, eccentric 1. Stand on the balls of your feet on the edge of a step. The ball of your foot is on the walking surface, right under your toes. 2. Place your hands on a wall or railing  for balance as needed, but try not to lean on it for support. 3. Rise up on your toes, using both legs to help. 4. Slowly shift all of your weight to your left / right foot and lift your other foot off the step. 5. Slowly lower your left / right heel so it drops below the level of the step. You will feel a slight stretch in your left / right calf. 6. Put your other foot back onto the step. Repeat 2 times. Complete this exercise 3 times per week. This information is not intended to replace advice given to you by your health care provider. Make sure you discuss any questions you have with your health care provider.

## 2019-02-16 ENCOUNTER — Telehealth: Payer: Self-pay | Admitting: Obstetrics & Gynecology

## 2019-02-16 DIAGNOSIS — I82409 Acute embolism and thrombosis of unspecified deep veins of unspecified lower extremity: Secondary | ICD-10-CM

## 2019-02-16 LAB — BASIC METABOLIC PANEL
BUN: 9 mg/dL (ref 6–23)
CO2: 29 mEq/L (ref 19–32)
Calcium: 9.3 mg/dL (ref 8.4–10.5)
Chloride: 104 mEq/L (ref 96–112)
Creatinine, Ser: 0.77 mg/dL (ref 0.40–1.20)
GFR: 76.08 mL/min (ref >60.00)
Glucose, Bld: 94 mg/dL (ref 70–99)
Potassium: 4.3 mEq/L (ref 3.5–5.1)
Sodium: 141 mEq/L (ref 135–145)

## 2019-02-16 NOTE — Telephone Encounter (Signed)
Per review of Epic, patient seen by PCP on 02/15/19, [place] confirmed DVT in RLE. Started on Xarelto, will see PCP for f/u 03/29/19. Was advised to f/u with GYN regarding HRT.   Call reviewed with Dr. Sabra Heck. Call returned to patient. Advised patient to stop Prometrium 100 mg caps and Biest/progesterone cream now. Dr. Sabra Heck will review and our office will f/u if any additional recommendations. Patient is scheduled for AEX on 04/07/19. Patient is aware to call if any concerns in the meantime.   Routing to Dr. Sabra Heck.

## 2019-02-16 NOTE — Telephone Encounter (Signed)
Please call pt and let her know that I think her DVT probably was from trauma and the HRT but she probably should have a hematology evaluation to rule out other causes of DVT.  This will help determine the length of time she will need the Xarelto.  Please make sure referral has been done and if not, refer to hematology for DVT.  Thanks.

## 2019-02-16 NOTE — Telephone Encounter (Signed)
Patient is calling regarding HRT. Patient stated that she was diagnosed with deep vein thrombosis yesterday. Patient stated that she thinks it was injury related, but the physician suggested coming off of HRT. Patient would like to know if that is suggested by Dr. Sabra Heck and/or how she should safely come off the medication.

## 2019-02-20 NOTE — Telephone Encounter (Signed)
Spoke to pt. Pt given update and recommendations per Dr [name]. Pt states was not referred to hematologist due to waiting to see what Dr [name] would advise with HRT. Pt would like referral to get further evaluation for DVT. States doing better since appt at PCP.   Orders placed for referral.  Routing to [facility]

## 2019-02-21 ENCOUNTER — Telehealth: Payer: Self-pay | Admitting: Family Medicine

## 2019-02-21 ENCOUNTER — Other Ambulatory Visit: Payer: Self-pay | Admitting: Hematology

## 2019-02-21 DIAGNOSIS — I82401 Acute embolism and thrombosis of unspecified deep veins of right lower extremity: Secondary | ICD-10-CM

## 2019-02-21 DIAGNOSIS — I82409 Acute embolism and thrombosis of unspecified deep veins of unspecified lower extremity: Secondary | ICD-10-CM | POA: Insufficient documentation

## 2019-02-21 NOTE — Progress Notes (Deleted)
Cancer Center CONSULT NOTE  Patient Care Team: Copland, [name], MD as PCP - General (Family Medicine) [name], MD (Gynecology)  HEME/ONC OVERVIEW: 1. RLE DVT, likely acute; provoked -Late 01/2019: doppler (new onset RLE pain and swelling) showed an age-indeterminate DVT from the mid-femoral to tibial veins  On Xarelto   ASSESSMENT & PLAN:   RLE DVT, likely acute; provoked -I reviewed the patient's records in detail, including PCP clinic notes, lab studies, and imaging results -In summary, patient presented to PCP in late 01/2019 for new onset right lower extremity pain and swelling after stumbling on the ice and having been immobile in bed x 5 days.  Doppler of the right lower extremity showed an age indeterminate DVT from the mid-femoral to tibial veins.  Patient was started on Xarelto, and referred to hematology for further evaluation.  Patient is also on estrogen-based hormone therapy. -I reviewed with the patient about the plan for care for DVT -This last episode of blood clot appeared to be provoked.  As the patient has not had personal or family history of VTE, and this is the first occurrence of DVT after trauma and prolonged immobility, there is no indication for hypercoaguable testing.  -She is tolerating Xarelto well without any abnormal bleeding or bruising.  For 1st occurrence of provoked DVT, the duration of anticoagulation is 3 months.   -I recommend the patient to use elastic compression stockings at 20-30 mmHg to reduce risks of chronic thrombophlebitis. -Finally, I reinforced the importance of preventive strategies such as avoiding hormonal supplement, avoiding cigarette smoking, keeping up-to-date with screening programs for early cancer detection, frequent ambulation for long distance travel and aggressive DVT prophylaxis in all surgical settings. -Should she need any interruption of the anticoagulation for elective procedures in the future, feel free  to contact me regarding peri-operative management.  No orders of the defined types were placed in this encounter.   A total of more than {CHL ONC TIME VISIT - POEUM:[id number]} were spent face-to-face with the patient during this encounter and over half of that time was spent on counseling and coordination of care as outlined above.    All questions were answered. The patient knows to call the clinic with any problems, questions or concerns.  [name], MD 02/21/2019 1:25 PM   CHIEF COMPLAINTS/PURPOSE OF CONSULTATION:  "I am here for ***"  HISTORY OF PRESENTING ILLNESS:  Kathryn Pineda 64 y.o. female is here because of ***  REVIEW OF SYSTEMS:   Constitutional: ( - ) fevers, ( - )  chills , ( - ) night sweats Eyes: ( - ) blurriness of vision, ( - ) double vision, ( - ) watery eyes Ears, nose, mouth, throat, and face: ( - ) mucositis, ( - ) sore throat Respiratory: ( - ) cough, ( - ) dyspnea, ( - ) wheezes Cardiovascular: ( - ) palpitation, ( - ) chest discomfort, ( - ) lower extremity swelling Gastrointestinal:  ( - ) nausea, ( - ) heartburn, ( - ) change in bowel habits Skin: ( - ) abnormal skin rashes Lymphatics: ( - ) new lymphadenopathy, ( - ) easy bruising Neurological: ( - ) numbness, ( - ) tingling, ( - ) new weaknesses Behavioral/Psych: ( - ) mood change, ( - ) new changes  All other systems were reviewed with the patient and are negative.  I have reviewed her chart and materials related to her cancer extensively and collaborated history with the patient. Summary  of oncologic history is as follows: Oncology History   No history exists.    MEDICAL HISTORY:  Past Medical History:  Diagnosis Date  . Anal fissure   . Arthritis   . Biliary colic 5/96   ERCP w/spincterotomy  . Condyloma   . Fatigue    loss of sleep  . Generalized headaches   . GERD (gastroesophageal reflux disease)   . Hemorrhoid   . Hiatal hernia   . Migraines   . Obesity   . Phlebitis of leg   .  PONV (postoperative nausea and vomiting)   . Primary osteoarthritis of both knees   . Stress incontinence   . Wilson's disease     SURGICAL HISTORY: Past Surgical History:  Procedure Laterality Date  . ANAL FISSURECTOMY  1986  . ANTERIOR AND POSTERIOR REPAIR N/A 11/24/2016   Procedure: ANTERIOR (CYSTOCELE) AND POSTERIOR REPAIR (RECTOCELE);  Surgeon: Alfredo Martinez, MD;  Location: [facility];  Service: Urology;  Laterality: N/A;  . BUNIONECTOMY Bilateral 2005  . CESAREAN SECTION  1990   prolapsed cord  . CYSTOSCOPY N/A 11/24/2016   Procedure: CYSTOSCOPY;  Surgeon: Alfredo Martinez, MD;  Location: [facility];  Service: Urology;  Laterality: N/A;  . ENDOVENOUS ABLATION SAPHENOUS VEIN W/ LASER  2008   both lower legs  . LAPAROSCOPIC CHOLECYSTECTOMY  1997  . LAPAROSCOPIC GASTRIC SLEEVE RESECTION WITH HIATAL HERNIA REPAIR  09/16/2010  . LAPAROSCOPIC VAGINAL HYSTERECTOMY WITH SALPINGECTOMY Bilateral 11/24/2016   Procedure: LAPAROSCOPIC ASSISTED VAGINAL HYSTERECTOMY WITH SALPINGO-OOPHORECTOMY;  Surgeon: [name], MD;  Location: [facility];  Service: Gynecology;  Laterality: Bilateral;    SOCIAL HISTORY: Social History   Socioeconomic History  . Marital status: Married    Spouse name: Not on file  . Number of children: Not on file  . Years of education: Not on file  . Highest education level: Not on file  Occupational History  . Not on file  Tobacco Use  . Smoking status: Never Smoker  . Smokeless tobacco: Never Used  Substance and Sexual Activity  . Alcohol use: Yes    Alcohol/week: 1.0 standard drinks    Types: 1 Glasses of wine per week  . Drug use: No  . Sexual activity: Yes    Partners: Male    Birth control/protection: Other-see comments, Surgical    Comment: LAVH 11/24/16  Other Topics Concern  . Not on file  Social History Narrative  . Not on file   Social Determinants of Health   Financial Resource Strain:   . Difficulty of Paying Living Expenses: Not on file  Food  Insecurity:   . Worried About Programme researcher, broadcasting/film/video in the Last Year: Not on file  . Ran Out of Food in the Last Year: Not on file  Transportation Needs:   . Lack of Transportation (Medical): Not on file  . Lack of Transportation (Non-Medical): Not on file  Physical Activity:   . Days of Exercise per Week: Not on file  . Minutes of Exercise per Session: Not on file  Stress:   . Feeling of Stress : Not on file  Social Connections:   . Frequency of Communication with Friends and Family: Not on file  . Frequency of Social Gatherings with Friends and Family: Not on file  . Attends Religious Services: Not on file  . Active Member of Clubs or Organizations: Not on file  . Attends Banker Meetings: Not on file  . Marital Status: Not on file  Intimate Partner  Violence:   . Fear of Current or Ex-Partner: Not on file  . Emotionally Abused: Not on file  . Physically Abused: Not on file  . Sexually Abused: Not on file    FAMILY HISTORY: Family History  Problem Relation Age of Onset  . Diabetes Father   . Colon polyps Father   . Osteoarthritis Mother   . Hypertension Brother   . Cancer Maternal Uncle        leukemia  . Cancer Maternal Grandmother        unaware  . Breast cancer Neg Hx     ALLERGIES:  is allergic to adhesive [tape] and penicillins.  MEDICATIONS:  Current Outpatient Medications  Medication Sig Dispense Refill  . b complex vitamins capsule Take 1 capsule by mouth daily.    . Cholecalciferol (VITAMIN D PO) Take 2,000 Units by mouth daily.     . Estradiol-Estriol-Progesterone (BIEST/PROGESTERONE) CREA Place onto the skin. 50/50 1 mg    . loratadine (CLARITIN) 10 MG tablet Take 10 mg by mouth daily as needed.     . Multiple Vitamin (MULTIVITAMIN) tablet Take 1 tablet by mouth every other day.     . Omega-3 Fatty Acids (FISH OIL) 1000 MG CAPS Take by mouth.    . progesterone (PROMETRIUM) 100 MG capsule Take 100 mg by mouth.  2  . psyllium (METAMUCIL) 58.6 %  packet Take 1 packet daily as needed by mouth.    Derrill Memo ON 03/08/2019] rivaroxaban (XARELTO) 20 MG TABS tablet Take 1 tablet (20 mg total) by mouth daily with supper. 30 tablet 2  . triamcinolone ointment (KENALOG) 0.1 % Apply 1 application topically 2 (two) times daily. Use as needed for rash 60 g 2   No current facility-administered medications for this visit.    PHYSICAL EXAMINATION: ECOG PERFORMANCE STATUS: {CHL ONC ECOG PS:[phone]}  There were no vitals filed for this visit. There were no vitals filed for this visit.  GENERAL: alert, no distress and comfortable SKIN: skin color, texture, turgor are normal, no rashes or significant lesions EYES: conjunctiva are pink and non-injected, sclera clear OROPHARYNX: no exudate, no erythema; lips, buccal mucosa, and tongue normal  NECK: supple, non-tender LYMPH:  no palpable lymphadenopathy in the cervical LUNGS: clear to auscultation with normal breathing effort HEART: regular rate & rhythm, no murmurs, no lower extremity edema ABDOMEN: soft, non-tender, non-distended, normal bowel sounds Musculoskeletal: no cyanosis of digits and no clubbing  PSYCH: alert & oriented x 3, fluent speech NEURO: no focal motor/sensory deficits  LABORATORY DATA:  I have reviewed the data as listed Lab Results  Component Value Date   WBC 4.1 04/18/2018   HGB 13.6 04/18/2018   HCT 40.2 04/18/2018   MCV 93.6 04/18/2018   PLT 207.0 04/18/2018   Lab Results  Component Value Date   NA 141 02/15/2019   K 4.3 02/15/2019   CL 104 02/15/2019   CO2 29 02/15/2019    RADIOGRAPHIC STUDIES: I have personally reviewed the radiological images as listed and agreed with the findings in the report. US Venous Img Lower Unilateral Right  Result Date: 02/15/2019 CLINICAL DATA:  Right lower extremity pain and edema. Evaluate for DVT. EXAM: RIGHT LOWER EXTREMITY VENOUS DOPPLER ULTRASOUND TECHNIQUE: Gray-scale sonography with graded compression, as well as color  Doppler and duplex ultrasound were performed to evaluate the lower extremity deep venous systems from the level of the common femoral vein and including the common femoral, femoral, profunda femoral, popliteal and calf veins including the  posterior tibial, peroneal and gastrocnemius veins when visible. The superficial great saphenous vein was also interrogated. Spectral Doppler was utilized to evaluate flow at rest and with distal augmentation maneuvers in the common femoral, femoral and popliteal veins. COMPARISON:  None. FINDINGS: Contralateral Common Femoral Vein: Respiratory phasicity is normal and symmetric with the symptomatic side. No evidence of thrombus. Normal compressibility. Common Femoral Vein: No evidence of thrombus. Normal compressibility, respiratory phasicity and response to augmentation. Saphenofemoral Junction: No evidence of thrombus. Normal compressibility and flow on color Doppler imaging. Profunda Femoral Vein: No evidence of thrombus. Normal compressibility and flow on color Doppler imaging. Femoral Vein: While the proximal aspect of the right femoral vein appears patent, there is age-indeterminate mixed echogenic predominantly occlusive DVT involving the mid (image 15) and distal (images 19 and 21) aspects of the right femoral vein. Popliteal Vein: There is age-indeterminate mixed echogenic occlusive DVT involving the right popliteal vein (images 23 and 24) Calf Veins: There is hypoechoic occlusive thrombus involving the right posterior tibial veins (image 27) as well as the right peroneal vein (image 30). Superficial Great Saphenous Vein: No evidence of thrombus. Normal compressibility. Other Findings:  None. IMPRESSION: Examination is positive for age-indeterminate predominantly occlusive DVT extending from the mid femoral vein through the imaged tibial veins. While a component of the DVT could be chronic in etiology, in the absence of prior examinations, an acute on chronic process is  not excluded. Electronically Signed   By: Simonne Come M.D.   On: 02/15/2019 14:45    PATHOLOGY: I have reviewed the pathology reports as documented in the oncologist history.

## 2019-02-22 ENCOUNTER — Telehealth: Payer: Self-pay | Admitting: *Deleted

## 2019-02-22 ENCOUNTER — Encounter: Payer: Self-pay | Admitting: Family Medicine

## 2019-02-22 DIAGNOSIS — I824Z1 Acute embolism and thrombosis of unspecified deep veins of right distal lower extremity: Secondary | ICD-10-CM

## 2019-02-22 NOTE — Telephone Encounter (Signed)
Patient needs PCP to refer to Oncology due to [facility] requirements that all specialist appts/referral be made by PCP  Will forward this message to Dr. Patsy Lager to take care of.  Her appt is tomorrow at [facility]

## 2019-02-22 NOTE — Telephone Encounter (Signed)
Copied from CRM [id number]. Topic: Quick Communication - See Telephone Encounter >> Feb 21, 2019  4:16 PM Aretta Nip wrote: CRM for notification. See Telephone encounter for: 02/21/19. Please have Dr Hollie Beach nurse FU asap. Pt [name], seen 12/30 by [name] he was to do a referral to oncology,  he put in notes but meanwhile the referral made by [facility], [facility] ins will not pay b/c [name] is not in system as approved by Jamelle Haring , somehow [place] needs to be referral for them to pay or 1/7 appt with oncology can not happen. Please FU with pt  at [phone]

## 2019-02-23 ENCOUNTER — Encounter: Payer: Self-pay | Admitting: Hematology

## 2019-02-23 ENCOUNTER — Inpatient Hospital Stay (HOSPITAL_BASED_OUTPATIENT_CLINIC_OR_DEPARTMENT_OTHER): Payer: No Typology Code available for payment source | Admitting: Hematology

## 2019-02-23 ENCOUNTER — Other Ambulatory Visit: Payer: Self-pay

## 2019-02-23 ENCOUNTER — Telehealth: Payer: Self-pay | Admitting: Hematology

## 2019-02-23 ENCOUNTER — Inpatient Hospital Stay: Payer: No Typology Code available for payment source

## 2019-02-23 ENCOUNTER — Inpatient Hospital Stay: Payer: No Typology Code available for payment source | Attending: Hematology

## 2019-02-23 ENCOUNTER — Inpatient Hospital Stay: Payer: No Typology Code available for payment source | Admitting: Hematology

## 2019-02-23 VITALS — BP 144/56 | HR 89 | Temp 97.1°F | Resp 18 | Wt 227.8 lb

## 2019-02-23 DIAGNOSIS — Z7901 Long term (current) use of anticoagulants: Secondary | ICD-10-CM

## 2019-02-23 DIAGNOSIS — E669 Obesity, unspecified: Secondary | ICD-10-CM | POA: Diagnosis not present

## 2019-02-23 DIAGNOSIS — I82431 Acute embolism and thrombosis of right popliteal vein: Secondary | ICD-10-CM

## 2019-02-23 DIAGNOSIS — I82401 Acute embolism and thrombosis of unspecified deep veins of right lower extremity: Secondary | ICD-10-CM

## 2019-02-23 DIAGNOSIS — I82491 Acute embolism and thrombosis of other specified deep vein of right lower extremity: Secondary | ICD-10-CM

## 2019-02-23 LAB — CBC WITH DIFFERENTIAL (CANCER CENTER ONLY)
Abs Immature Granulocytes: 0.01 10*3/uL (ref 0.00–0.07)
Basophils Absolute: 0.1 10*3/uL (ref 0.0–0.1)
Basophils Relative: 1 %
Eosinophils Absolute: 0.2 10*3/uL (ref 0.0–0.5)
Eosinophils Relative: 4 %
HCT: 40.3 % (ref 36.0–46.0)
Hemoglobin: 13.5 g/dL (ref 12.0–15.0)
Immature Granulocytes: 0 %
Lymphocytes Relative: 27 %
Lymphs Abs: 1.5 10*3/uL (ref 0.7–4.0)
MCH: 31 pg (ref 26.0–34.0)
MCHC: 33.5 g/dL (ref 30.0–36.0)
MCV: 92.6 fL (ref 80.0–100.0)
Monocytes Absolute: 0.5 10*3/uL (ref 0.1–1.0)
Monocytes Relative: 10 %
Neutro Abs: 3.1 10*3/uL (ref 1.7–7.7)
Neutrophils Relative %: 58 %
Platelet Count: 304 10*3/uL (ref 150–400)
RBC: 4.35 MIL/uL (ref 3.87–5.11)
RDW: 12.6 % (ref 11.5–15.5)
WBC Count: 5.5 10*3/uL (ref 4.0–10.5)
nRBC: 0 % (ref 0.0–0.2)

## 2019-02-23 LAB — CMP (CANCER CENTER ONLY)
ALT: 11 U/L (ref 0–44)
AST: 16 U/L (ref 15–41)
Albumin: 4.4 g/dL (ref 3.5–5.0)
Alkaline Phosphatase: 48 U/L (ref 38–126)
Anion gap: 7 (ref 5–15)
BUN: 12 mg/dL (ref 8–23)
CO2: 28 mmol/L (ref 22–32)
Calcium: 9.4 mg/dL (ref 8.9–10.3)
Chloride: 105 mmol/L (ref 98–111)
Creatinine: 0.79 mg/dL (ref 0.44–1.00)
GFR, Est AFR Am: >60 mL/min (ref >60)
GFR, Estimated: >60 mL/min (ref >60)
Glucose, Bld: 118 mg/dL — ABNORMAL HIGH (ref 70–99)
Potassium: 3.9 mmol/L (ref 3.5–5.1)
Sodium: 140 mmol/L (ref 135–145)
Total Bilirubin: 0.8 mg/dL (ref 0.3–1.2)
Total Protein: 6.7 g/dL (ref 6.5–8.1)

## 2019-02-23 LAB — D-DIMER, QUANTITATIVE: D-Dimer, Quant: 0.89 ug/mL-FEU — ABNORMAL HIGH (ref 0.00–0.50)

## 2019-02-23 MED FILL — XARELTO 20 MG TABLET: 20 | 30 days supply | Qty: 30 | Fill #0

## 2019-02-23 NOTE — Telephone Encounter (Signed)
Called and spoke with patient regarding appointments scheduled per 1/7 los

## 2019-02-23 NOTE — Progress Notes (Signed)
[name] CONSULT NOTE  Patient Care Team: Copland, [name], MD as PCP - General (Family Medicine) [name], MD (Gynecology)  HEME/ONC OVERVIEW: 1. RLE DVT, likely acute; provoked -Late 01/2019: doppler (new onset RLE pain and swelling) showed an age-indeterminate DVT from the mid-femoral to tibial veins  On Xarelto 20mg  daily   ASSESSMENT & PLAN:   RLE DVT, likely acute; provoked -I reviewed the patient's records in detail, including PCP clinic notes, lab studies, and imaging results -In summary, patient presented to PCP in late 01/2019 for new onset right lower extremity pain and swelling x 5 days, worsened after she slipped and landed on her R foot.  Doppler of the right lower extremity showed an age indeterminate DVT from the mid-femoral to tibial veins.  Patient was started on Xarelto, and referred to hematology for further evaluation.  Of note, patient started estrogen-based hormone therapy in early 2020 for possible Alzheimer dementia prophylaxis by her PCP.  Her father was diagnosed with DVT in late age after having had advanced dementia.  -I reviewed with the patient about the plan for care for DVT -D-dimer mildly elevated, but expected after the recent DVT  -This last episode of blood clot appeared to be provoked in the setting of oral contraceptives.  As the patient has not had personal history of VTE, and this is the first occurrence of DVT, the duration of anticoagulation is 3 months.  -While her father was diagnosed with DVT, it was likely in the setting of immobility with advanced dementia.  With that said, we can consider checking hypercoagulable work-up after the patient completes the anticoagulation treatment.  -I recommend the patient to use elastic compression stockings to reduce risks of chronic thrombophlebitis. -Finally, I reinforced the importance of preventive strategies such as avoiding hormonal supplement, avoiding cigarette smoking, keeping  up-to-date with screening programs for early cancer detection, frequent ambulation for long distance travel and aggressive DVT prophylaxis in all surgical settings. -Should she need any interruption of the anticoagulation for elective procedures in the future, feel free to contact me regarding peri-operative management.  Orders Placed This Encounter  Procedures  . CBC w/ diff    Standing Status:   Future    Standing Expiration Date:   03/29/2020  . CMP    Standing Status:   Future    Standing Expiration Date:   03/29/2020  . D-dimer    Standing Status:   Future    Standing Expiration Date:   03/29/2020   The total time spent in the appointment was 55 minutes encounter with patients including review of chart and various tests results, discussions about plan of care and coordination of care plan  All questions were answered. The patient knows to call the clinic with any problems, questions or concerns. No barriers to learning was detected.  Return in 2 months for labs and clinic follow-up.   05/27/2020, MD 1/7/202110:11 AM  CHIEF COMPLAINTS/PURPOSE OF CONSULTATION:  "My leg swelling is much better"  HISTORY OF PRESENTING ILLNESS:  Kathryn Pineda 64 y.o. female is here because of newly diagnosed right lower extremity DVT.  Patient reports that starting in late 01/2019, she developed new onset, mild pain and swelling in the right calf, lasting about 5 days.  She slipped on the ice and landed on her right foot 4, after which the right lower extremity pain and swelling became significantly worse, prompting her to go to her PCP for further evaluation.  Doppler of  the lower extremity showed an age indeterminate DVT from the mid-femoral to tibial veins.  Patient was started on Xarelto, and referred to hematology for further evaluation.  Patient reports that since starting Xarelto, the right lower extremity swelling has improved significantly.  She still has some mild redness discoloration near the  right ankle.  She denies any abnormal bleeding or bruising on Xarelto.  She had started estrogen-based chemotherapy in early 06/18/2018 after her father passed away from advanced Alzheimer dementia and she had done extensive research on medications that may prevent dementia.  After being diagnosed with right lower extremity DVT, she has stopped estrogen therapy.  She also reports that her father was diagnosed with DVT at an old age after having had advanced dementia and was immobile.   There is no other family member with history of venous thromboembolic disorder.  She denies any recent long distance travel or prolonged immobility.  She is up-to-date with her cancer screening, including mammogram and colonoscopy.  She had a complete hysterectomy, and therefore has not required Pap smear.  She denies any other complaint today.  REVIEW OF SYSTEMS:   Constitutional: ( - ) fevers, ( - )  chills , ( - ) night sweats Eyes: ( - ) blurriness of vision, ( - ) double vision, ( - ) watery eyes Ears, nose, mouth, throat, and face: ( - ) mucositis, ( - ) sore throat Respiratory: ( - ) cough, ( - ) dyspnea, ( - ) wheezes Cardiovascular: ( - ) palpitation, ( - ) chest discomfort, ( + ) lower extremity swelling Gastrointestinal:  ( - ) nausea, ( - ) heartburn, ( - ) change in bowel habits Skin: ( - ) abnormal skin rashes Lymphatics: ( - ) new lymphadenopathy, ( - ) easy bruising Neurological: ( - ) numbness, ( - ) tingling, ( - ) new weaknesses Behavioral/Psych: ( - ) mood change, ( - ) new changes  All other systems were reviewed with the patient and are negative.  I have reviewed her chart and materials related to her cancer extensively and collaborated history with the patient. Summary of oncologic history is as follows: Oncology History   No history exists.    MEDICAL HISTORY:  Past Medical History:  Diagnosis Date  . Anal fissure   . Arthritis   . Biliary colic 5/96   ERCP w/spincterotomy  . Condyloma   .  Fatigue    loss of sleep  . Generalized headaches   . GERD (gastroesophageal reflux disease)   . Hemorrhoid   . Hiatal hernia   . Migraines   . Obesity   . Phlebitis of leg   . PONV (postoperative nausea and vomiting)   . Primary osteoarthritis of both knees   . Stress incontinence   . Wilson's disease     SURGICAL HISTORY: Past Surgical History:  Procedure Laterality Date  . ANAL FISSURECTOMY  1986  . ANTERIOR AND POSTERIOR REPAIR N/A 11/24/2016   Procedure: ANTERIOR (CYSTOCELE) AND POSTERIOR REPAIR (RECTOCELE);  Surgeon: Alfredo Martinez, MD;  Location: [facility];  Service: Urology;  Laterality: N/A;  . BUNIONECTOMY Bilateral 2005  . CESAREAN SECTION  1990   prolapsed cord  . CYSTOSCOPY N/A 11/24/2016   Procedure: CYSTOSCOPY;  Surgeon: Alfredo Martinez, MD;  Location: [facility];  Service: Urology;  Laterality: N/A;  . ENDOVENOUS ABLATION SAPHENOUS VEIN W/ LASER  Jun 18, 2006   both lower legs  . LAPAROSCOPIC CHOLECYSTECTOMY  1997  . LAPAROSCOPIC GASTRIC SLEEVE RESECTION WITH HIATAL  HERNIA REPAIR  09/16/2010  . LAPAROSCOPIC VAGINAL HYSTERECTOMY WITH SALPINGECTOMY Bilateral 11/24/2016   Procedure: LAPAROSCOPIC ASSISTED VAGINAL HYSTERECTOMY WITH SALPINGO-OOPHORECTOMY;  Surgeon: [name], MD;  Location: [facility];  Service: Gynecology;  Laterality: Bilateral;    SOCIAL HISTORY: Social History   Socioeconomic History  . Marital status: Married    Spouse name: Not on file  . Number of children: Not on file  . Years of education: Not on file  . Highest education level: Not on file  Occupational History  . Not on file  Tobacco Use  . Smoking status: Never Smoker  . Smokeless tobacco: Never Used  Substance and Sexual Activity  . Alcohol use: Yes    Alcohol/week: 1.0 standard drinks    Types: 1 Glasses of wine per week  . Drug use: No  . Sexual activity: Yes    Partners: Male    Birth control/protection: Other-see comments, Surgical    Comment: LAVH 11/24/16  Other Topics Concern   . Not on file  Social History Narrative  . Not on file   Social Determinants of Health   Financial Resource Strain:   . Difficulty of Paying Living Expenses: Not on file  Food Insecurity:   . Worried About Programme researcher, broadcasting/film/video in the Last Year: Not on file  . Ran Out of Food in the Last Year: Not on file  Transportation Needs:   . Lack of Transportation (Medical): Not on file  . Lack of Transportation (Non-Medical): Not on file  Physical Activity:   . Days of Exercise per Week: Not on file  . Minutes of Exercise per Session: Not on file  Stress:   . Feeling of Stress : Not on file  Social Connections:   . Frequency of Communication with Friends and Family: Not on file  . Frequency of Social Gatherings with Friends and Family: Not on file  . Attends Religious Services: Not on file  . Active Member of Clubs or Organizations: Not on file  . Attends Banker Meetings: Not on file  . Marital Status: Not on file  Intimate Partner Violence:   . Fear of Current or Ex-Partner: Not on file  . Emotionally Abused: Not on file  . Physically Abused: Not on file  . Sexually Abused: Not on file    FAMILY HISTORY: Family History  Problem Relation Age of Onset  . Diabetes Father   . Colon polyps Father   . Osteoarthritis Mother   . Hypertension Brother   . Cancer Maternal Uncle        leukemia  . Cancer Maternal Grandmother        unaware  . Breast cancer Neg Hx     ALLERGIES:  is allergic to adhesive [tape] and penicillins.  MEDICATIONS:  Current Outpatient Medications  Medication Sig Dispense Refill  . b complex vitamins capsule Take 1 capsule by mouth daily.    Marland Kitchen loratadine (CLARITIN) 10 MG tablet Take 10 mg by mouth daily as needed.     . Multiple Vitamin (MULTIVITAMIN) tablet Take 1 tablet by mouth every other day.     . Omega-3 Fatty Acids (FISH OIL) 1000 MG CAPS Take by mouth.    . psyllium (METAMUCIL) 58.6 % packet Take 1 packet daily as needed by mouth.     Melene Muller ON 03/08/2019] rivaroxaban (XARELTO) 20 MG TABS tablet Take 1 tablet (20 mg total) by mouth daily with supper. (Patient taking differently: Take 15 mg by mouth daily  with supper. ) 30 tablet 2  . Cholecalciferol (VITAMIN D PO) Take 2,000 Units by mouth daily.     Marland Kitchen. triamcinolone ointment (KENALOG) 0.1 % Apply 1 application topically 2 (two) times daily. Use as needed for rash 60 g 2   No current facility-administered medications for this visit.    PHYSICAL EXAMINATION: ECOG PERFORMANCE STATUS: 0 - Asymptomatic  Vitals:   02/23/19 0913  BP: (!) 144/56  Pulse: 89  Resp: 18  Temp: (!) 97.1 F (36.2 C)  SpO2: 99%   Filed Weights   02/23/19 0913  Weight: 227 lb 12.8 oz (103.3 kg)    GENERAL: alert, no distress and comfortable SKIN: mild chronic venous stasis changes in the right lower extremity, primarily involving the ankle  EYES: conjunctiva are pink and non-injected, sclera clear OROPHARYNX: no exudate, no erythema; lips, buccal mucosa, and tongue normal  NECK: supple, non-tender LUNGS: clear to auscultation with normal breathing effort HEART: regular rate & rhythm, no murmurs, 1+ right lower extremity edema to the ankle  ABDOMEN: soft, non-tender, non-distended, normal bowel sounds Musculoskeletal: no cyanosis of digits and no clubbing  PSYCH: alert & oriented x 3, fluent speech  LABORATORY DATA:  I have reviewed the data as listed Lab Results  Component Value Date   WBC 5.5 02/23/2019   HGB 13.5 02/23/2019   HCT 40.3 02/23/2019   MCV 92.6 02/23/2019   PLT 304 02/23/2019   Lab Results  Component Value Date   NA 140 02/23/2019   K 3.9 02/23/2019   CL 105 02/23/2019   CO2 28 02/23/2019    RADIOGRAPHIC STUDIES: I have personally reviewed the radiological images as listed and agreed with the findings in the report. US Venous Img Lower Unilateral Right  Result Date: 02/15/2019 CLINICAL DATA:  Right lower extremity pain and edema. Evaluate for DVT. EXAM:  RIGHT LOWER EXTREMITY VENOUS DOPPLER ULTRASOUND TECHNIQUE: Gray-scale sonography with graded compression, as well as color Doppler and duplex ultrasound were performed to evaluate the lower extremity deep venous systems from the level of the common femoral vein and including the common femoral, femoral, profunda femoral, popliteal and calf veins including the posterior tibial, peroneal and gastrocnemius veins when visible. The superficial great saphenous vein was also interrogated. Spectral Doppler was utilized to evaluate flow at rest and with distal augmentation maneuvers in the common femoral, femoral and popliteal veins. COMPARISON:  None. FINDINGS: Contralateral Common Femoral Vein: Respiratory phasicity is normal and symmetric with the symptomatic side. No evidence of thrombus. Normal compressibility. Common Femoral Vein: No evidence of thrombus. Normal compressibility, respiratory phasicity and response to augmentation. Saphenofemoral Junction: No evidence of thrombus. Normal compressibility and flow on color Doppler imaging. Profunda Femoral Vein: No evidence of thrombus. Normal compressibility and flow on color Doppler imaging. Femoral Vein: While the proximal aspect of the right femoral vein appears patent, there is age-indeterminate mixed echogenic predominantly occlusive DVT involving the mid (image 15) and distal (images 19 and 21) aspects of the right femoral vein. Popliteal Vein: There is age-indeterminate mixed echogenic occlusive DVT involving the right popliteal vein (images 23 and 24) Calf Veins: There is hypoechoic occlusive thrombus involving the right posterior tibial veins (image 27) as well as the right peroneal vein (image 30). Superficial Great Saphenous Vein: No evidence of thrombus. Normal compressibility. Other Findings:  None. IMPRESSION: Examination is positive for age-indeterminate predominantly occlusive DVT extending from the mid femoral vein through the imaged tibial veins. While a  component of the DVT could be chronic  in etiology, in the absence of prior examinations, an acute on chronic process is not excluded. Electronically Signed   By: Sandi Mariscal M.D.   On: 02/15/2019 14:45    PATHOLOGY: I have reviewed the pathology reports as documented in the oncologist history.

## 2019-03-27 NOTE — Progress Notes (Signed)
[facility] [address] [place], [place] [address] [phone]  Date:  03/29/2019   Name:  Kathryn Pineda   DOB:  Nov 11, 1955   MRN:  [id number]  PCP:  Pearline Cables, MD    Chief Complaint: DVT (follow up)   History of Present Illness:  Kathryn Pineda is a 64 y.o. very pleasant female patient who presents with the following:  Here today for follow-up visit History of DVT, obesity status post lap band. Last seen by myself March 2020 In the interim, she developed a right calf DVT and was started on Xarelto per my partner Dr. [name] She was evaluated by hematology about 1 month ago-they plan to continue Xarelto for approximately 3 months, and possibly do hypercoagulability work-up after she completes treatment.  Her father did have a DVT towards the end of his life -otherwise she is not aware of any family history of DVT or PE  Her leg is feeling "so much better" Still sore but less swollen and less painful  She had an episode of rectal bleeding a few days ago She has had rectal bleeding rarely off and on for a decade or so-thought due to hemorrhoids, she also had a hysterectomy and cystocele/rectocele repair in 2018, she has had somewhat more frequent bleeding episodes since this operation She had a couple of episodes since she started on xarelto - were not severe episodes, noted a very small amount of blood Normal stool yesterday  She got the Pfizer COVID-19 vaccine; her DVT was actually diagnosed the day after her second dose.  She wonders if there could be a connection as COVID-19 infection does cause hypercoagulability  No shortness of breath or chest pain  Patient Active Problem List   Diagnosis Date Noted  . DVT (deep venous thrombosis) (HCC) 02/21/2019  . Rectocele 11/13/2016  . Incomplete uterine prolapse 11/13/2016  . Cystocele, midline 11/13/2016  . Seasonal allergies 09/17/2014  . Incontinence    . Obesity (BMI 30-39.9) 12/24/2010  . Hx of laparoscopic gastric banding 09/23/2010    Past Medical History:  Diagnosis Date  . Anal fissure   . Arthritis   . Biliary colic 5/96   ERCP w/spincterotomy  . Condyloma   . Fatigue    loss of sleep  . Generalized headaches   . GERD (gastroesophageal reflux disease)   . Hemorrhoid   . Hiatal hernia   . Migraines   . Obesity   . Phlebitis of leg   . PONV (postoperative nausea and vomiting)   . Primary osteoarthritis of both knees   . Stress incontinence   . Wilson's disease     Past Surgical History:  Procedure Laterality Date  . ANAL FISSURECTOMY  1986  . ANTERIOR AND POSTERIOR REPAIR N/A 11/24/2016   Procedure: ANTERIOR (CYSTOCELE) AND POSTERIOR REPAIR (RECTOCELE);  Surgeon: Alfredo Martinez, MD;  Location: [facility];  Service: Urology;  Laterality: N/A;  . BUNIONECTOMY Bilateral 2005  . CESAREAN SECTION  1990   prolapsed cord  . CYSTOSCOPY N/A 11/24/2016   Procedure: CYSTOSCOPY;  Surgeon: Alfredo Martinez, MD;  Location: [facility];  Service: Urology;  Laterality: N/A;  . ENDOVENOUS ABLATION SAPHENOUS VEIN W/ LASER  2008   both lower legs  . LAPAROSCOPIC CHOLECYSTECTOMY  1997  . LAPAROSCOPIC GASTRIC SLEEVE RESECTION WITH HIATAL HERNIA REPAIR  09/16/2010  . LAPAROSCOPIC VAGINAL HYSTERECTOMY WITH SALPINGECTOMY Bilateral 11/24/2016   Procedure: LAPAROSCOPIC ASSISTED VAGINAL HYSTERECTOMY WITH  SALPINGO-OOPHORECTOMY;  Surgeon: Megan Salon, MD;  Location: [facility];  Service: Gynecology;  Laterality: Bilateral;    Social History   Tobacco Use  . Smoking status: Never Smoker  . Smokeless tobacco: Never Used  Substance Use Topics  . Alcohol use: Yes    Alcohol/week: 1.0 standard drinks    Types: 1 Glasses of wine per week  . Drug use: No    Family History  Problem Relation Age of Onset  . Diabetes Father   . Colon polyps Father   . Osteoarthritis Mother   . Hypertension Brother   . Cancer Maternal Uncle        leukemia  .  Cancer Maternal Grandmother        unaware  . Breast cancer Neg Hx     Allergies  Allergen Reactions  . Adhesive [Tape] Other (See Comments)    Band-Aid caused pt to blister   . Penicillins Itching, Rash and Other (See Comments)    All over body. All deriatives    Medication list has been reviewed and updated.  Current Outpatient Medications on File Prior to Visit  Medication Sig Dispense Refill  . b complex vitamins capsule Take 1 capsule by mouth daily.    . Cholecalciferol (VITAMIN D PO) Take 2,000 Units by mouth daily.     Marland Kitchen loratadine (CLARITIN) 10 MG tablet Take 10 mg by mouth daily as needed.     . Multiple Vitamin (MULTIVITAMIN) tablet Take 1 tablet by mouth every other day.     . Omega-3 Fatty Acids (FISH OIL) 1000 MG CAPS Take by mouth.    . psyllium (METAMUCIL) 58.6 % packet Take 1 packet daily as needed by mouth.    . rivaroxaban (XARELTO) 20 MG TABS tablet Take 1 tablet (20 mg total) by mouth daily with supper. (Patient taking differently: Take 15 mg by mouth daily with supper. ) 30 tablet 2  . triamcinolone ointment (KENALOG) 0.1 % Apply 1 application topically 2 (two) times daily. Use as needed for rash 60 g 2   No current facility-administered medications on file prior to visit.    Review of Systems:  As per HPI- otherwise negative.   Physical Examination: Vitals:   03/29/19 1408  BP: 132/82  Pulse: 68  Resp: 16  Temp: (!) 96.6 F (35.9 C)  SpO2: 98%   Vitals:   03/29/19 1408  Weight: 230 lb (104.3 kg)  Height: 5' 8.5" (1.74 m)   Body mass index is 34.46 kg/m. Ideal Body Weight: Weight in (lb) to have BMI = 25: 166.5  GEN: no acute distress. Obese, looks well  HEENT: Atraumatic, Normocephalic.  Ears and Nose: No external deformity. CV: RRR, No M/G/R. No JVD. No thrill. No extra heart sounds. PULM: CTA B, no wheezes, crackles, rhonchi. No retractions. No resp. distress. No accessory muscle use. ABD: S, NT, ND, +BS. No rebound. No HSM. EXTR: No  c/c.  She does still have some increased calf diameter on the right compared with left, but patient states this is much improved.  The calf is not tender or hot Right foot displays strong pulses and normal cap refill. PSYCH: Normally interactive. Conversant.    Assessment and Plan: Acute deep vein thrombosis (DVT) of distal vein of right lower extremity (HCC)  Rectal bleeding  Office visit today for concern of recently diagnosed DVT Ultrasound report from 02/15/2019: IMPRESSION: Examination is positive for age-indeterminate predominantly occlusive DVT extending from the mid femoral vein through the imaged tibial  veins. While a component of the DVT could be chronic in etiology, in the absence of prior examinations, an acute on chronic process is not excluded.  She is taking Xarelto, seems to be responding well with decreased symptoms.  She is under hematology care and will see her doctor for a follow-up visit next month.  We discussed likelihood of 3 months of treatment, consider hypercoagulability work-up once treatment is completed She did receive a COVID-19 vaccine the day prior to DVT diagnosis.  I have encouraged her to contact [employer] and report this in case this is a vaccine reaction  We discussed her rectal bleeding-for her report this has been present for about a decade, has gotten worse over the last 2 years since her rectocele operation.  She does have diagnosed of hemorrhoids on her last colonoscopy.  She is due to have a follow-up colonoscopy in about 9 months.  I offered to check a CBC today to watch for any change, patient declines as she does not feel she lost any significant blood recently.  Encouraged her to have colonoscopy when due without fail  Moderate medical decision making today  We did discuss risk of increased bleeding with Xarelto, she will seek care if any more severe bleeding This visit occurred during the SARS-CoV-2 public health emergency.  Safety protocols  were in place, including screening questions prior to the visit, additional usage of staff PPE, and extensive cleaning of exam room while observing appropriate contact time as indicated for disinfecting solutions.    Signed Abbe Amsterdam, MD

## 2019-03-28 ENCOUNTER — Other Ambulatory Visit: Payer: Self-pay

## 2019-03-29 ENCOUNTER — Encounter: Payer: Self-pay | Admitting: Family Medicine

## 2019-03-29 ENCOUNTER — Ambulatory Visit (INDEPENDENT_AMBULATORY_CARE_PROVIDER_SITE_OTHER): Payer: No Typology Code available for payment source | Admitting: Family Medicine

## 2019-03-29 VITALS — BP 132/82 | HR 68 | Temp 96.6°F | Resp 16 | Ht 68.5 in | Wt 230.0 lb

## 2019-03-29 DIAGNOSIS — K625 Hemorrhage of anus and rectum: Secondary | ICD-10-CM

## 2019-03-29 DIAGNOSIS — I824Z1 Acute embolism and thrombosis of unspecified deep veins of right distal lower extremity: Secondary | ICD-10-CM

## 2019-03-29 MED FILL — XARELTO 20 MG TABLET: 20 | 30 days supply | Qty: 30 | Fill #1

## 2019-03-29 NOTE — Patient Instructions (Addendum)
Please contact [employer] and let them know about your blood clot- this could be an important side effect, we are not sure yet   [email or web address]  continue the xarelto as directed by hematology- generally we will treat for about 3 months for this sort of DVT  If your rectal bleeding becomes more severe or frequent than is typical for you please alert me, and be sure to get your colonoscopy in the next year or so

## 2019-04-05 ENCOUNTER — Telehealth: Payer: Self-pay | Admitting: Family Medicine

## 2019-04-05 DIAGNOSIS — Z01419 Encounter for gynecological examination (general) (routine) without abnormal findings: Secondary | ICD-10-CM

## 2019-04-05 NOTE — Telephone Encounter (Signed)
Referral placed to gyn

## 2019-04-05 NOTE — Telephone Encounter (Signed)
PT has the focus plan. She is has an appointment for an OBGYN on  04/07/19 Dr. Valentina Shaggy She wants to know if she can get a referral put In before her appt on Friday. Please advise

## 2019-04-07 ENCOUNTER — Other Ambulatory Visit: Payer: Self-pay

## 2019-04-07 ENCOUNTER — Ambulatory Visit (INDEPENDENT_AMBULATORY_CARE_PROVIDER_SITE_OTHER): Payer: No Typology Code available for payment source | Admitting: Obstetrics & Gynecology

## 2019-04-07 ENCOUNTER — Encounter: Payer: Self-pay | Admitting: Obstetrics & Gynecology

## 2019-04-07 VITALS — BP 110/70 | HR 76 | Temp 97.2°F | Resp 10 | Ht 68.0 in | Wt 226.0 lb

## 2019-04-07 DIAGNOSIS — Z01419 Encounter for gynecological examination (general) (routine) without abnormal findings: Secondary | ICD-10-CM | POA: Diagnosis not present

## 2019-04-07 DIAGNOSIS — R3915 Urgency of urination: Secondary | ICD-10-CM

## 2019-04-07 DIAGNOSIS — Z0000 Encounter for general adult medical examination without abnormal findings: Secondary | ICD-10-CM

## 2019-04-07 LAB — POCT URINALYSIS DIPSTICK
Bilirubin, UA: NEGATIVE
Blood, UA: NEGATIVE
Glucose, UA: NEGATIVE
Ketones, UA: NEGATIVE
Nitrite, UA: NEGATIVE
Protein, UA: NEGATIVE
Urobilinogen, UA: 0.2 E.U./dL
pH, UA: 5 (ref 5.0–8.0)

## 2019-04-07 MED ORDER — SULFAMETHOXAZOLE-TRIMETHOPRIM 800-160 MG PO TABS: 1.0000 | ORAL_TABLET | Freq: Two times a day (BID) | ORAL | 0 refills | Status: DC

## 2019-04-07 MED FILL — SULFAMETHOXAZOLE-TMP DS TAB: 800-160 | 3 days supply | Qty: 6 | Fill #0

## 2019-04-07 NOTE — Patient Instructions (Signed)
[facility] [address] [place],  [place]  [address]  Main: [phone]  Sunday:Closed Monday:7:30 AM - 6:00 PM Tuesday:7:30 AM - 6:00 PM Wednesday:7:30 AM - 6:00 PM Thursday:7:30 AM - 6:00 PM Friday:7:30 AM - 6:00 PM Saturday:8:00 AM - 4:30 PM 

## 2019-04-07 NOTE — Progress Notes (Signed)
64 y.o. [id number] Married White or Caucasian female here for annual exam. Patient complains of having "irritation" with urination and urgency.  Urine has WBCs in it today.  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  Does enjoy this.    Denies vaginal bleeding.    Did receive the Pfizer Covid vaccinatoin.  Had RLE DVT.  On Xarelto.  Has follow up with [facility] in about two months.    No LMP recorded. Patient has had a hysterectomy.          Sexually active: Yes.    The current method of family planning is post menopausal status.    Exercising: Yes.    workout Smoker:  no  Health Maintenance: Pap:  03/06/16 Neg. HR HPV:neg             09/18/13 Neg History of abnormal Pap:  no MMG:  04/19/18 BIRADS 1 negative/density c Colonoscopy:  03/11/15 normal.  Dr. Ewing Schlein.  Follow up 5 years.   BMD:   never  TDaP:  2012 Pneumonia vaccine(s):  n/a Shingrix:   Discussed with pt today.  She is going to try and get this during this current year Hep C testing: 03/06/16 Negative Screening Labs: discuss today   reports that she has never smoked. She has never used smokeless tobacco. She reports current alcohol use of about 1.0 standard drinks of alcohol per week. She reports that she does not use drugs.  Past Medical History:  Diagnosis Date  . Anal fissure   . Arthritis   . Biliary colic 5/96   ERCP w/spincterotomy  . Condyloma   . Deep vein thrombosis (DVT) of lower extremity (HCC)   . Fatigue    loss of sleep  . Generalized headaches   . GERD (gastroesophageal reflux disease)   . Hemorrhoid   . Hiatal hernia   . Migraines   . Obesity   . Phlebitis of leg   . PONV (postoperative nausea and vomiting)   . Primary osteoarthritis of both knees   . Stress incontinence   . Wilson's disease     Past Surgical History:  Procedure Laterality Date  . ANAL FISSURECTOMY  1986  . ANTERIOR AND POSTERIOR REPAIR N/A 11/24/2016   Procedure: ANTERIOR (CYSTOCELE) AND POSTERIOR  REPAIR (RECTOCELE);  Surgeon: Alfredo Martinez, MD;  Location: [facility];  Service: Urology;  Laterality: N/A;  . BUNIONECTOMY Bilateral 2005  . CESAREAN SECTION  1990   prolapsed cord  . CYSTOSCOPY N/A 11/24/2016   Procedure: CYSTOSCOPY;  Surgeon: Alfredo Martinez, MD;  Location: [facility];  Service: Urology;  Laterality: N/A;  . ENDOVENOUS ABLATION SAPHENOUS VEIN W/ LASER  2008   both lower legs  . LAPAROSCOPIC CHOLECYSTECTOMY  1997  . LAPAROSCOPIC GASTRIC SLEEVE RESECTION WITH HIATAL HERNIA REPAIR  09/16/2010  . LAPAROSCOPIC VAGINAL HYSTERECTOMY WITH SALPINGECTOMY Bilateral 11/24/2016   Procedure: LAPAROSCOPIC ASSISTED VAGINAL HYSTERECTOMY WITH SALPINGO-OOPHORECTOMY;  Surgeon: [name], MD;  Location: [facility];  Service: Gynecology;  Laterality: Bilateral;    Current Outpatient Medications  Medication Sig Dispense Refill  . b complex vitamins capsule Take 1 capsule by mouth daily.    . Cholecalciferol (VITAMIN D PO) Take 2,000 Units by mouth daily.     Marland Kitchen loratadine (CLARITIN) 10 MG tablet Take 10 mg by mouth daily as needed.     . Multiple Vitamin (MULTIVITAMIN) tablet Take 1 tablet by mouth every other day.     . Omega-3 Fatty Acids (FISH  OIL) 1000 MG CAPS Take by mouth.    . psyllium (METAMUCIL) 58.6 % packet Take 1 packet daily as needed by mouth.    . rivaroxaban (XARELTO) 20 MG TABS tablet Take 1 tablet (20 mg total) by mouth daily with supper. (Patient taking differently: Take 15 mg by mouth daily with supper. ) 30 tablet 2  . triamcinolone ointment (KENALOG) 0.1 % Apply 1 application topically 2 (two) times daily. Use as needed for rash 60 g 2   No current facility-administered medications for this visit.    Family History  Problem Relation Age of Onset  . Diabetes Father   . Colon polyps Father   . Osteoarthritis Mother   . Hypertension Brother   . Cancer Maternal Uncle        leukemia  . Cancer Maternal Grandmother        unaware  . Breast cancer Neg Hx     Review  of Systems  Genitourinary: Positive for urgency.       Irritation with urination  All other systems reviewed and are negative.   Exam:   BP 110/70 (BP Location: Right Arm, Patient Position: Sitting, Cuff Size: Large)   Pulse 76   Temp (!) 97.2 F (36.2 C) (Temporal)   Resp 10   Ht 5\' 8"  (1.727 m)   Wt 226 lb (102.5 kg)   BMI 34.36 kg/m    Height: 5\' 8"  (172.7 cm)  Ht Readings from Last 3 Encounters:  04/07/19 5\' 8"  (1.727 m)  03/29/19 5' 8.5" (1.74 m)  02/15/19 5' 8.5" (1.74 m)    General appearance: alert, cooperative and appears stated age Head: Normocephalic, without obvious abnormality, atraumatic Neck: no adenopathy, supple, symmetrical, trachea midline and thyroid normal to inspection and palpation Lungs: clear to auscultation bilaterally Breasts: normal appearance, no masses or tenderness Heart: regular rate and rhythm Abdomen: soft, non-tender; bowel sounds normal; no masses,  no organomegaly Extremities: extremities normal, atraumatic, no cyanosis or edema Skin: Skin color, texture, turgor normal. No rashes or lesions Lymph nodes: Cervical, supraclavicular, and axillary nodes normal. No abnormal inguinal nodes palpated Neurologic: Grossly normal   Pelvic: External genitalia:  no lesions              Urethra:  normal appearing urethra with no masses, tenderness or lesions              Bartholins and Skenes: normal                 Vagina: normal appearing vagina with normal color and discharge, no lesions              Cervix: absent              Pap taken: No. Bimanual Exam:  Uterus:  uterus absent              Adnexa: no mass, fullness, tenderness               Rectovaginal: Confirms               Anus:  normal sphincter tone, no lesions  Chaperone, Terence Lux, CMA, was present for exam.  A:  Well Woman with normal exam PMP, no HRT H/o DVT after first Covid vaccine 02/2019 Person hx of colon polyps H/o LAVH/bilateral salpingectomy due to cystocele and  rectocele Urinary urgency  P:   Mammogram guidelines reviewed BMD order is placed pap smear not indicated (negative pathology with hysterectomy) Vit D level obtained  today.  Will plan additional blood work with Dr. Patsy Lager earlier this year Tdap due next year Shingrix vaccine information discussed.   Colonoscopy due 02/2020 Urine culture obtained Bactrim DS bid 3 day Return annually or prn

## 2019-04-08 LAB — VITAMIN D 25 HYDROXY (VIT D DEFICIENCY, FRACTURES): Vit D, 25-Hydroxy: 29.9 ng/mL — ABNORMAL LOW (ref 30.0–100.0)

## 2019-04-09 LAB — URINE CULTURE

## 2019-04-10 ENCOUNTER — Other Ambulatory Visit: Payer: Self-pay

## 2019-04-10 MED ORDER — CEPHALEXIN 250 MG PO CAPS: 250.0000 mg | ORAL_CAPSULE | Freq: Four times a day (QID) | ORAL | 0 refills | Status: DC

## 2019-04-10 MED FILL — CEPHALEXIN 250 MG CAPSULE: 250 | 5 days supply | Qty: 20 | Fill #0

## 2019-04-24 ENCOUNTER — Other Ambulatory Visit: Payer: Self-pay

## 2019-04-24 ENCOUNTER — Encounter: Payer: Self-pay | Admitting: Hematology

## 2019-04-24 ENCOUNTER — Inpatient Hospital Stay (HOSPITAL_BASED_OUTPATIENT_CLINIC_OR_DEPARTMENT_OTHER): Payer: No Typology Code available for payment source | Admitting: Hematology

## 2019-04-24 ENCOUNTER — Inpatient Hospital Stay: Payer: No Typology Code available for payment source | Attending: Hematology

## 2019-04-24 VITALS — BP 112/56 | HR 71 | Temp 97.3°F | Resp 18 | Ht 68.5 in | Wt 223.0 lb

## 2019-04-24 DIAGNOSIS — I82401 Acute embolism and thrombosis of unspecified deep veins of right lower extremity: Secondary | ICD-10-CM

## 2019-04-24 DIAGNOSIS — Z86718 Personal history of other venous thrombosis and embolism: Secondary | ICD-10-CM | POA: Diagnosis not present

## 2019-04-24 DIAGNOSIS — I82491 Acute embolism and thrombosis of other specified deep vein of right lower extremity: Secondary | ICD-10-CM

## 2019-04-24 DIAGNOSIS — Z7901 Long term (current) use of anticoagulants: Secondary | ICD-10-CM | POA: Diagnosis not present

## 2019-04-24 LAB — CBC WITH DIFFERENTIAL (CANCER CENTER ONLY)
Abs Immature Granulocytes: 0 10*3/uL (ref 0.00–0.07)
Basophils Absolute: 0 10*3/uL (ref 0.0–0.1)
Basophils Relative: 1 %
Eosinophils Absolute: 0.1 10*3/uL (ref 0.0–0.5)
Eosinophils Relative: 3 %
HCT: 39.2 % (ref 36.0–46.0)
Hemoglobin: 12.9 g/dL (ref 12.0–15.0)
Immature Granulocytes: 0 %
Lymphocytes Relative: 24 %
Lymphs Abs: 1.2 10*3/uL (ref 0.7–4.0)
MCH: 31.1 pg (ref 26.0–34.0)
MCHC: 32.9 g/dL (ref 30.0–36.0)
MCV: 94.5 fL (ref 80.0–100.0)
Monocytes Absolute: 0.4 10*3/uL (ref 0.1–1.0)
Monocytes Relative: 8 %
Neutro Abs: 3.2 10*3/uL (ref 1.7–7.7)
Neutrophils Relative %: 64 %
Platelet Count: 235 10*3/uL (ref 150–400)
RBC: 4.15 MIL/uL (ref 3.87–5.11)
RDW: 12.2 % (ref 11.5–15.5)
WBC Count: 4.9 10*3/uL (ref 4.0–10.5)
nRBC: 0 % (ref 0.0–0.2)

## 2019-04-24 LAB — CMP (CANCER CENTER ONLY)
ALT: 10 U/L (ref 0–44)
AST: 14 U/L — ABNORMAL LOW (ref 15–41)
Albumin: 4.4 g/dL (ref 3.5–5.0)
Alkaline Phosphatase: 45 U/L (ref 38–126)
Anion gap: 7 (ref 5–15)
BUN: 11 mg/dL (ref 8–23)
CO2: 29 mmol/L (ref 22–32)
Calcium: 9.6 mg/dL (ref 8.9–10.3)
Chloride: 106 mmol/L (ref 98–111)
Creatinine: 0.83 mg/dL (ref 0.44–1.00)
GFR, Est AFR Am: >60 mL/min (ref >60)
GFR, Estimated: >60 mL/min (ref >60)
Glucose, Bld: 142 mg/dL — ABNORMAL HIGH (ref 70–99)
Potassium: 3.8 mmol/L (ref 3.5–5.1)
Sodium: 142 mmol/L (ref 135–145)
Total Bilirubin: 0.6 mg/dL (ref 0.3–1.2)
Total Protein: 6.5 g/dL (ref 6.5–8.1)

## 2019-04-24 LAB — D-DIMER, QUANTITATIVE: D-Dimer, Quant: <0.27 ug/mL-FEU (ref 0.00–0.50)

## 2019-04-24 NOTE — Progress Notes (Signed)
[facility] OFFICE PROGRESS NOTE  Patient Care Team: Copland, Gay Filler, MD as PCP - General (Family Medicine) Megan Salon, MD (Gynecology)  HEME/ONC OVERVIEW: 1. RLE DVT, likely acute; provoked in the setting of estrogen therapy  -Late 01/2019: doppler (new onset RLE pain and swelling) showed an age-indeterminate DVT from the mid-femoral to tibial veins  On Xarelto 20mg  daily   ASSESSMENT & PLAN:   RLE DVT, likely acute; provoked -Patient is tolerating Xarelto 20mg  well without any abnormal bleeding -I reviewed with the patient the guideline for the treatment of 1st occurrence, provoked DVT -As she has completed 3 months of therapeutic anticoagulation, and does not have any clinically suspicious symptoms for recurrent DVT, she can discontinue anticoagulation -I reinforced the importance of preventive strategies such as avoiding hormonal supplement, avoiding cigarette smoking, keeping up-to-date with screening programs for early cancer detection, frequent ambulation for long distance travel and aggressive DVT prophylaxis in all surgical settings. -No indication for hypercoagulable testing in the setting of 1st occurrence of provoked DVT.  -I discussed with the patient some of the concerning symptoms, such as progressive lower extremity swelling, chest pain, dyspnea or hemoptysis, for which she should seek care promptly   No orders of the defined types were placed in this encounter.  The total time spent in the encounter was 32 minutes, including face-to-face time with the patient, review of various tests results, order additional studies/medications, documentation, and coordination of care plan.   All questions were answered. The patient knows to call the clinic with any problems, questions or concerns. No barriers to learning was detected.  Return as needed.   Tish Men, MD 3/8/202110:35 AM  CHIEF COMPLAINT: "I am doing okay"  INTERVAL HISTORY: Ms. Loughmiller  returns to clinic for follow-up of history of provoked right lower extremity DVT on Xarelto.  Patient reports that she has been tolerating Xarelto well without any abnormal bleeding or bruising.  She still has some periodic right lower extremity swelling, but she does not wear compression stockings regularly except during work.  She denies any constitutional symptoms, chest pain, dyspnea, or hemoptysis.  She denies any other complaint today.  REVIEW OF SYSTEMS:   Constitutional: ( - ) fevers, ( - )  chills , ( - ) night sweats Eyes: ( - ) blurriness of vision, ( - ) double vision, ( - ) watery eyes Ears, nose, mouth, throat, and face: ( - ) mucositis, ( - ) sore throat Respiratory: ( - ) cough, ( - ) dyspnea, ( - ) wheezes Cardiovascular: ( - ) palpitation, ( - ) chest discomfort, ( + ) lower extremity swelling Gastrointestinal:  ( - ) nausea, ( - ) heartburn, ( - ) change in bowel habits Skin: ( - ) abnormal skin rashes Lymphatics: ( - ) new lymphadenopathy, ( - ) easy bruising Neurological: ( - ) numbness, ( - ) tingling, ( - ) new weaknesses Behavioral/Psych: ( - ) mood change, ( - ) new changes  All other systems were reviewed with the patient and are negative.  SUMMARY OF ONCOLOGIC HISTORY: Oncology History   No history exists.    I have reviewed the past medical history, past surgical history, social history and family history with the patient and they are unchanged from previous note.  ALLERGIES:  is allergic to penicillins and adhesive [tape].  MEDICATIONS:  Current Outpatient Medications  Medication Sig Dispense Refill  . b complex vitamins capsule Take 1 capsule by mouth daily.    Marland Kitchen  Cholecalciferol (VITAMIN D PO) Take 2,000 Units by mouth daily.     Marland Kitchen loratadine (CLARITIN) 10 MG tablet Take 10 mg by mouth daily as needed.     . Multiple Vitamin (MULTIVITAMIN) tablet Take 1 tablet by mouth every other day.     . Omega-3 Fatty Acids (FISH OIL) 1000 MG CAPS Take by mouth.    .  psyllium (METAMUCIL) 58.6 % packet Take 1 packet daily as needed by mouth.    . triamcinolone ointment (KENALOG) 0.1 % Apply 1 application topically 2 (two) times daily. Use as needed for rash 60 g 2   No current facility-administered medications for this visit.    PHYSICAL EXAMINATION: ECOG PERFORMANCE STATUS: 1 - Symptomatic but completely ambulatory  Today's Vitals   04/24/19 1006  BP: (!) 112/56  Pulse: 71  Resp: 18  Temp: (!) 97.3 F (36.3 C)  TempSrc: Temporal  SpO2: 100%  Weight: 223 lb (101.2 kg)  Height: 5' 8.5" (1.74 m)  PainSc: 2    Body mass index is 33.41 kg/m.  Filed Weights   04/24/19 1006  Weight: 223 lb (101.2 kg)    GENERAL: alert, no distress and comfortable SKIN: slightly dark erythema over the right ankle, c/w stasis dermatitis  EYES: conjunctiva are pink and non-injected, sclera clear OROPHARYNX: no exudate, no erythema; lips, buccal mucosa, and tongue normal  NECK: supple, non-tender LUNGS: clear to auscultation with normal breathing effort HEART: regular rate & rhythm and no murmurs and trace right lower extremity edema ABDOMEN: soft, non-tender, non-distended, normal bowel sounds Musculoskeletal: no cyanosis of digits and no clubbing  PSYCH: alert & oriented x 3, fluent speech  LABORATORY DATA:  I have reviewed the data as listed    Component Value Date/Time   NA 142 04/24/2019 0938   K 3.8 04/24/2019 0938   CL 106 04/24/2019 0938   CO2 29 04/24/2019 0938   GLUCOSE 142 (H) 04/24/2019 0938   BUN 11 04/24/2019 0938   CREATININE 0.83 04/24/2019 0938   CREATININE 0.77 03/06/2016 1203   CALCIUM 9.6 04/24/2019 0938   PROT 6.5 04/24/2019 0938   ALBUMIN 4.4 04/24/2019 0938   AST 14 (L) 04/24/2019 0938   ALT 10 04/24/2019 0938   ALKPHOS 45 04/24/2019 0938   BILITOT 0.6 04/24/2019 0938   GFRNONAA >60 04/24/2019 0938   GFRAA >60 04/24/2019 0938    No results found for: SPEP, UPEP  Lab Results  Component Value Date   WBC 4.9 04/24/2019    NEUTROABS 3.2 04/24/2019   HGB 12.9 04/24/2019   HCT 39.2 04/24/2019   MCV 94.5 04/24/2019   PLT 235 04/24/2019      Chemistry      Component Value Date/Time   NA 142 04/24/2019 0938   K 3.8 04/24/2019 0938   CL 106 04/24/2019 0938   CO2 29 04/24/2019 0938   BUN 11 04/24/2019 0938   CREATININE 0.83 04/24/2019 0938   CREATININE 0.77 03/06/2016 1203      Component Value Date/Time   CALCIUM 9.6 04/24/2019 0938   ALKPHOS 45 04/24/2019 0938   AST 14 (L) 04/24/2019 0938   ALT 10 04/24/2019 0938   BILITOT 0.6 04/24/2019 0938       RADIOGRAPHIC STUDIES: I have personally reviewed the radiological images as listed below and agreed with the findings in the report. No results found.

## 2019-05-22 ENCOUNTER — Encounter: Payer: Self-pay | Admitting: Obstetrics & Gynecology

## 2019-05-22 ENCOUNTER — Other Ambulatory Visit: Payer: Self-pay | Admitting: Obstetrics & Gynecology

## 2019-05-22 DIAGNOSIS — E2839 Other primary ovarian failure: Secondary | ICD-10-CM

## 2019-05-22 DIAGNOSIS — Z1231 Encounter for screening mammogram for malignant neoplasm of breast: Secondary | ICD-10-CM

## 2019-05-22 NOTE — Progress Notes (Signed)
dexa order placed

## 2019-05-26 ENCOUNTER — Telehealth: Payer: Self-pay | Admitting: Family Medicine

## 2019-05-26 DIAGNOSIS — Z13820 Encounter for screening for osteoporosis: Secondary | ICD-10-CM

## 2019-05-26 DIAGNOSIS — Z0100 Encounter for examination of eyes and vision without abnormal findings: Secondary | ICD-10-CM

## 2019-05-26 NOTE — Telephone Encounter (Signed)
Patient also states that patient needs a referral for bone desenity. Per patient's insurance patient needs a referral so they will pay.

## 2019-05-26 NOTE — Telephone Encounter (Signed)
Caller : Kathryn Pineda  Call Back # [phone]  Per patient is needs a referral to an Eye Doctor, patient believes that she had covid earlier this year and has eye changes. Per patient eye doctor is requesting patient have a referral    Doctor # [facility] [address] [place], [place], [place] [address] [phone]

## 2019-05-26 NOTE — Telephone Encounter (Signed)
Placed both orders for patient.

## 2019-08-02 ENCOUNTER — Ambulatory Visit
Admission: RE | Admit: 2019-08-02 | Discharge: 2019-08-02 | Disposition: A | Discharge status: Home / Self Care | Payer: No Typology Code available for payment source | Source: Ambulatory Visit | Attending: Obstetrics & Gynecology | Admitting: Obstetrics & Gynecology

## 2019-08-02 ENCOUNTER — Other Ambulatory Visit: Payer: Self-pay

## 2019-08-02 DIAGNOSIS — E2839 Other primary ovarian failure: Secondary | ICD-10-CM

## 2019-08-02 DIAGNOSIS — Z1231 Encounter for screening mammogram for malignant neoplasm of breast: Secondary | ICD-10-CM

## 2019-09-13 ENCOUNTER — Other Ambulatory Visit: Payer: Self-pay

## 2019-09-13 ENCOUNTER — Ambulatory Visit (INDEPENDENT_AMBULATORY_CARE_PROVIDER_SITE_OTHER): Payer: No Typology Code available for payment source | Admitting: Medical

## 2019-09-13 VITALS — BP 110/78 | HR 68 | Temp 97.8°F | Resp 20 | Ht 68.0 in | Wt 215.6 lb

## 2019-09-13 DIAGNOSIS — J029 Acute pharyngitis, unspecified: Secondary | ICD-10-CM | POA: Diagnosis not present

## 2019-09-13 MED ORDER — AZITHROMYCIN 250 MG PO TABS
ORAL_TABLET | ORAL | 0 refills | Status: DC
Start: 2019-09-13 — End: 2020-03-13

## 2019-09-13 MED FILL — AZITHROMYCIN 250 MG TABS: 250 | 5 days supply | Qty: 6 | Fill #0

## 2019-09-13 NOTE — Progress Notes (Signed)
Subjective:    Patient ID: Kathryn Pineda, female    DOB: [birth year], 64 y.o.   MRN: [id number]  HPI  Pt has recent st. Pt works for [employer] St started on Saturday evening. Pt could not go to work on Monday due to + answers on questioneer. Pt has covid test on Monday. Tuesday pt covid test came back negative . Test done at [facility].  Pt has been vaccinated in December and January. Moderate st after second vaccine.  Pt st still sore. Tonsils mild enlarged.lymph nodes are still tender.   Review of Systems  Constitutional: Negative for chills, fatigue and fever.  HENT: Positive for sore throat. Negative for congestion, sinus pressure and sinus pain.   Respiratory: Negative for cough, shortness of breath and wheezing.   Cardiovascular: Negative for chest pain and palpitations.  Gastrointestinal: Negative for abdominal pain.  Musculoskeletal: Negative for back pain and myalgias.  Neurological: Negative for dizziness and headaches.  Hematological: Positive for adenopathy.  Psychiatric/Behavioral: Negative for behavioral problems and confusion.   Past Medical History:  Diagnosis Date  . Anal fissure   . Arthritis   . Biliary colic 5/96   ERCP w/spincterotomy  . Condyloma   . Deep vein thrombosis (DVT) of lower extremity (HCC)   . Fatigue    loss of sleep  . Generalized headaches   . GERD (gastroesophageal reflux disease)   . Hemorrhoid   . Hiatal hernia   . Migraines   . Obesity   . Phlebitis of leg   . PONV (postoperative nausea and vomiting)   . Primary osteoarthritis of both knees   . Stress incontinence   . Wilson's disease      Social History   Socioeconomic History  . Marital status: Married    Spouse name: Not on file  . Number of children: Not on file  . Years of education: Not on file  . Highest education level: Not on file  Occupational History  . Not on file  Tobacco Use  . Smoking status: Never Smoker  . Smokeless tobacco: Never Used  Vaping Use  .  Vaping Use: Never used  Substance and Sexual Activity  . Alcohol use: Yes    Alcohol/week: 1.0 standard drink    Types: 1 Glasses of wine per week  . Drug use: No  . Sexual activity: Yes    Partners: Male    Birth control/protection: Other-see comments, Surgical    Comment: LAVH 11/24/16  Other Topics Concern  . Not on file  Social History Narrative  . Not on file   Social Determinants of Health   Financial Resource Strain:   . Difficulty of Paying Living Expenses:   Food Insecurity:   . Worried About Programme researcher, broadcasting/film/video in the Last Year:   . Barista in the Last Year:   Transportation Needs:   . Freight forwarder (Medical):   Marland Kitchen Lack of Transportation (Non-Medical):   Physical Activity:   . Days of Exercise per Week:   . Minutes of Exercise per Session:   Stress:   . Feeling of Stress :   Social Connections:   . Frequency of Communication with Friends and Family:   . Frequency of Social Gatherings with Friends and Family:   . Attends Religious Services:   . Active Member of Clubs or Organizations:   . Attends Banker Meetings:   Marland Kitchen Marital Status:   Intimate Partner Violence:   . Fear of  Current or Ex-Partner:   . Emotionally Abused:   Marland Kitchen Physically Abused:   . Sexually Abused:     Past Surgical History:  Procedure Laterality Date  . ANAL FISSURECTOMY  1986  . ANTERIOR AND POSTERIOR REPAIR N/A 11/24/2016   Procedure: ANTERIOR (CYSTOCELE) AND POSTERIOR REPAIR (RECTOCELE);  Surgeon: Alfredo Martinez, MD;  Location: [facility];  Service: Urology;  Laterality: N/A;  . BUNIONECTOMY Bilateral 2005  . CESAREAN SECTION  1990   prolapsed cord  . CYSTOSCOPY N/A 11/24/2016   Procedure: CYSTOSCOPY;  Surgeon: Alfredo Martinez, MD;  Location: [facility];  Service: Urology;  Laterality: N/A;  . ENDOVENOUS ABLATION SAPHENOUS VEIN W/ LASER  2008   both lower legs  . LAPAROSCOPIC CHOLECYSTECTOMY  1997  . LAPAROSCOPIC GASTRIC SLEEVE RESECTION WITH HIATAL HERNIA  REPAIR  09/16/2010  . LAPAROSCOPIC VAGINAL HYSTERECTOMY WITH SALPINGECTOMY Bilateral 11/24/2016   Procedure: LAPAROSCOPIC ASSISTED VAGINAL HYSTERECTOMY WITH SALPINGO-OOPHORECTOMY;  Surgeon: [name], MD;  Location: [facility];  Service: Gynecology;  Laterality: Bilateral;    Family History  Problem Relation Age of Onset  . Diabetes Father   . Colon polyps Father   . Osteoarthritis Mother   . Hypertension Brother   . Cancer Maternal Uncle        leukemia  . Cancer Maternal Grandmother        unaware    Allergies  Allergen Reactions  . Penicillins Itching and Rash  . Adhesive [Tape] Other (See Comments)    Band-Aid causes blisters    Current Outpatient Medications on File Prior to Visit  Medication Sig Dispense Refill  . b complex vitamins capsule Take 1 capsule by mouth daily.    . Cholecalciferol (VITAMIN D PO) Take 2,000 Units by mouth daily.     Marland Kitchen loratadine (CLARITIN) 10 MG tablet Take 10 mg by mouth daily as needed.     . Multiple Vitamin (MULTIVITAMIN) tablet Take 1 tablet by mouth every other day.     . Omega-3 Fatty Acids (FISH OIL) 1000 MG CAPS Take by mouth.    . psyllium (METAMUCIL) 58.6 % packet Take 1 packet daily as needed by mouth.    . triamcinolone ointment (KENALOG) 0.1 % Apply 1 application topically 2 (two) times daily. Use as needed for rash (Patient not taking: Reported on 09/13/2019) 60 g 2   No current facility-administered medications on file prior to visit.    BP 110/78   Pulse 68   Temp 97.8 F (36.6 C) (Oral)   Resp 20   Ht 5\' 8"  (1.727 m)   Wt (!) 215 lb 9.6 oz (97.8 kg)   SpO2 98%   BMI 32.78 kg/m      Objective:   Physical Exam  General  Mental Status - Alert. General Appearance - Well groomed. Not in acute distress.  Skin Rashes- No Rashes.  HEENT Head- Normal. Ear Auditory Canal - Left- Normal. Right - Normal.Tympanic Membrane- Left- Normal. Right- Normal. Eye Sclera/Conjunctiva- Left- Normal. Right- Normal. Nose &  Sinuses Nasal Mucosa- Left-   Not Boggy and Congested. Right- not   Boggy and  Congested.Bilateral no  maxillary and no  frontal sinus pressure. Mouth & Throat Lips: Upper Lip- Normal: no dryness, cracking, pallor, cyanosis, or vesicular eruption. Lower Lip-Normal: no dryness, cracking, pallor, cyanosis or vesicular eruption. Buccal Mucosa- Bilateral- No Aphthous ulcers. Oropharynx- No Discharge or Erythema. Tonsils: Characteristics- Bilateral- Erythema mild . Size/Enlargement- Bilateral- 1+ bilateral tonsil enlargement. Discharge- bilateral-None.  Neck Neck- Supple. No Masses. Mid bilateral  submandibular nodes that are tender.   Chest and Lung Exam Auscultation: Breath Sounds:-Clear even and unlabored.  Cardiovascular Auscultation:Rythm- Regular, rate and rhythm. Murmurs & Other Heart Sounds:Ausculatation of the heart reveal- No Murmurs.  Lymphatic Head & Neck General Head & Neck Lymphatics: Bilateral: Description- see submandbular notes above.       Assessment & Plan:  You have recent st with some mild swollen submandibular tenderness bilaterally. Will do a rapid strep test. Unfortunate we don't have culture tubes to do send out.  Do thinks best to go ahead and start azithromycin antibiotic.  Do want you to follow up 10-14 days or as needed. Need to reheck your lymph nodes. If still swollen get cbc.  Esperanza Richters, PA-C   Time spent with patient today was 20  minutes which consisted of chart review, discussing diagnosis, work up, treatment and documentation.

## 2019-09-13 NOTE — Patient Instructions (Addendum)
You have recent st with some mild swollen submandibular tenderness bilaterally. Will do a rapid strep test. Unfortunate we don't have culture tubes to do send out.  Do thinks best to go ahead and start azithromycin antibiotic.  Do want you to follow up 10-14 days or as needed. Need to reheck your lymph nodes. If still swollen get cbc.

## 2020-02-05 IMAGING — US US SOFT TISSUE HEAD/NECK
1 series · 14 of 20 positions shown · non-contrast
Comparison: None.

CLINICAL DATA: Forehead nodule for 6 months.

EXAM:
ULTRASOUND OF HEAD/NECK SOFT TISSUES
TECHNIQUE: Ultrasound examination of the head and neck soft tissues was
performed in the area of clinical concern.

[Series 1: us soft tissue head/neck · 0.03mm/px · 14 of 20 slices shown]
[im 1/20]
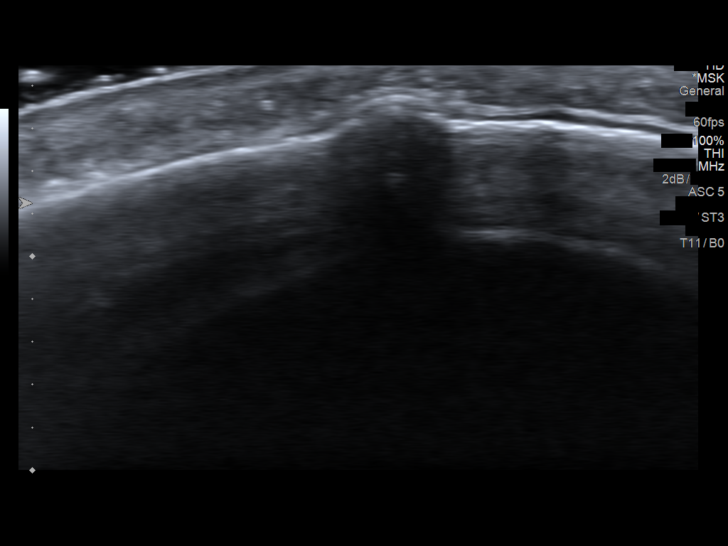
[im 3/20]
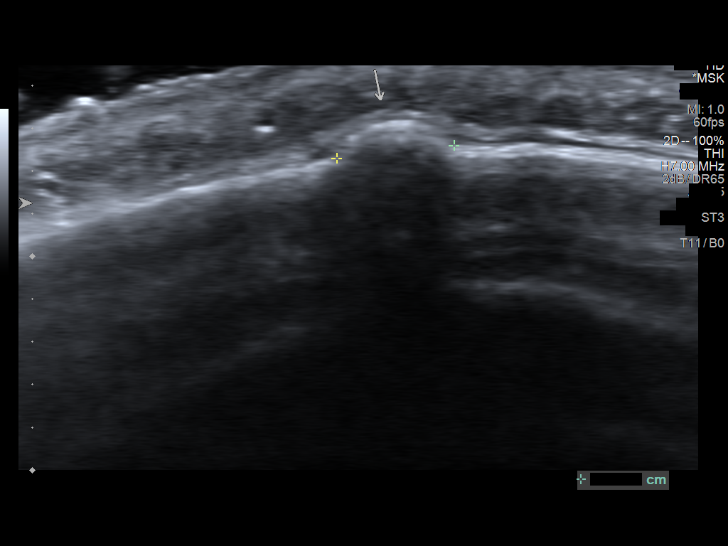
[im 4/20]
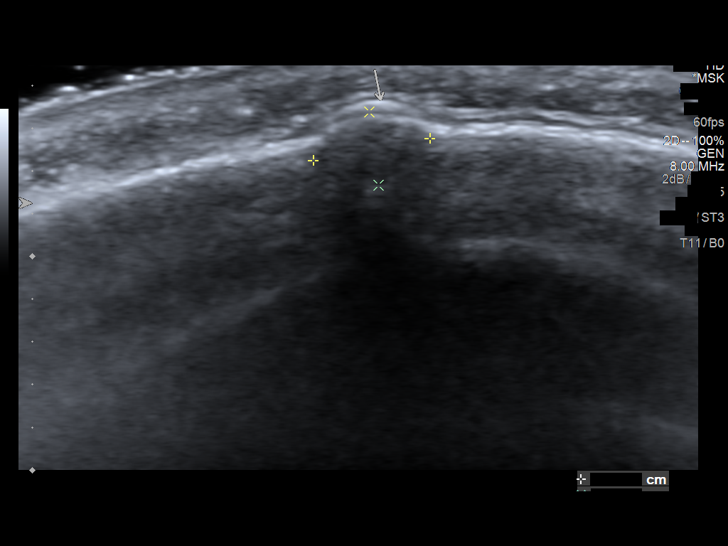
[im 6/20]
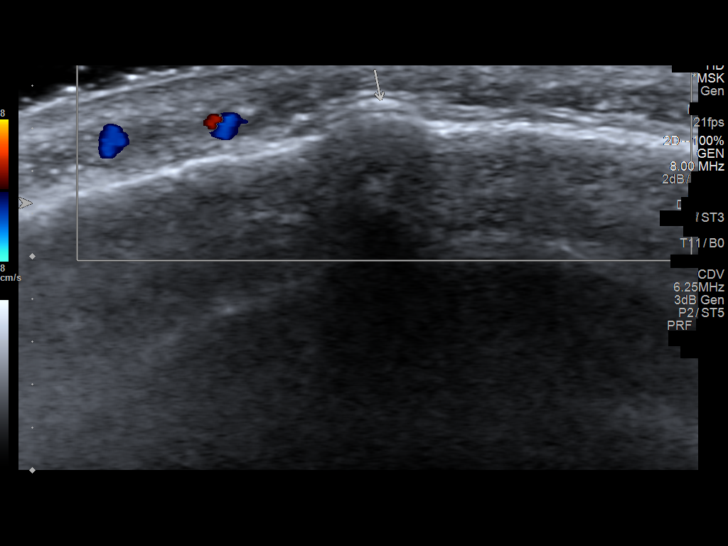
[im 7/20]
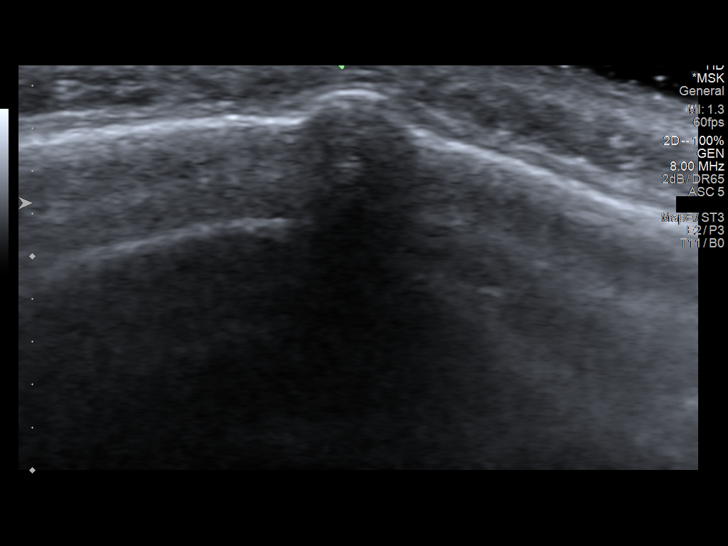
[im 8/20]
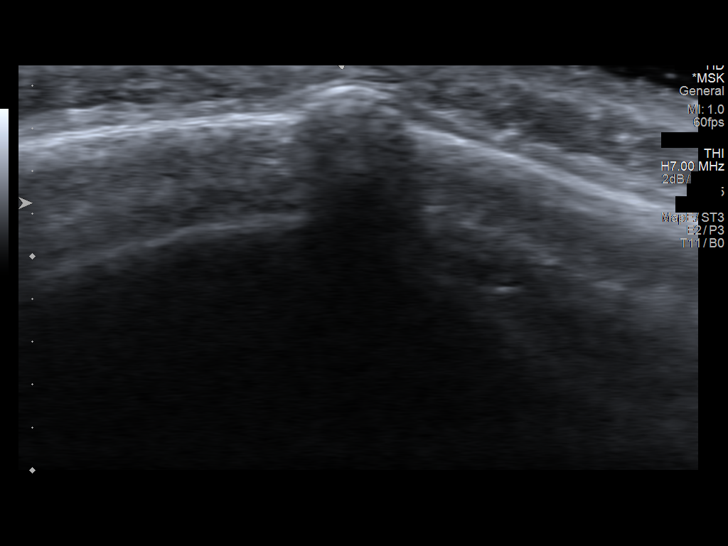
[im 10/20]
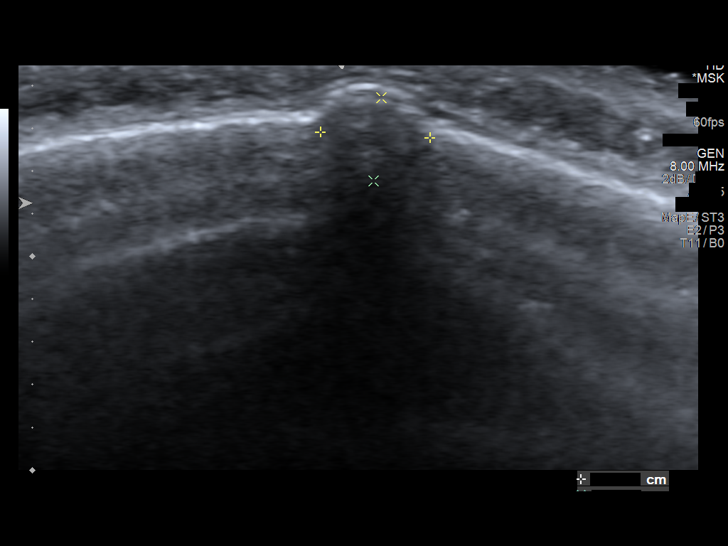
[im 11/20]
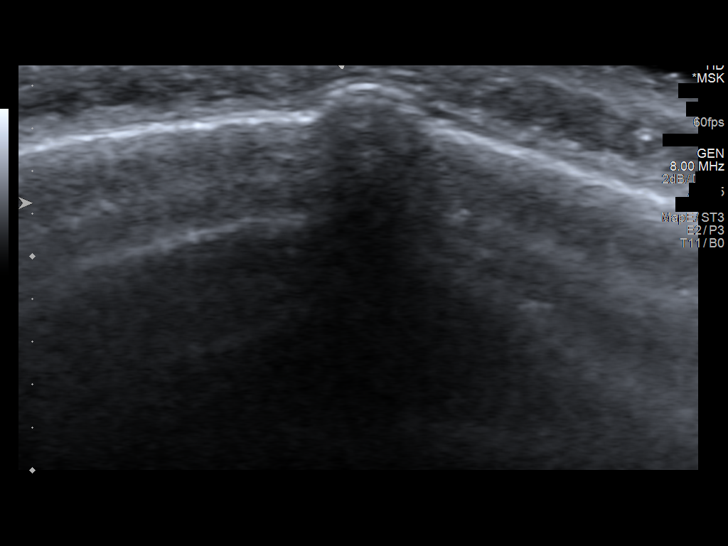
[im 13/20]
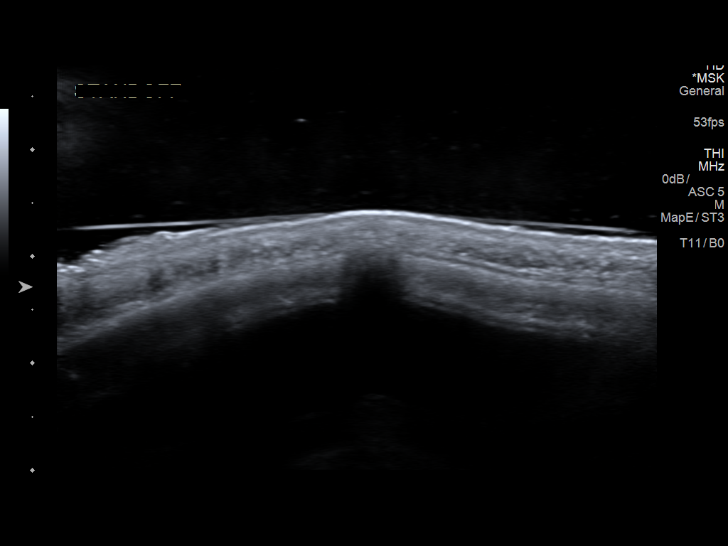
[im 14/20]
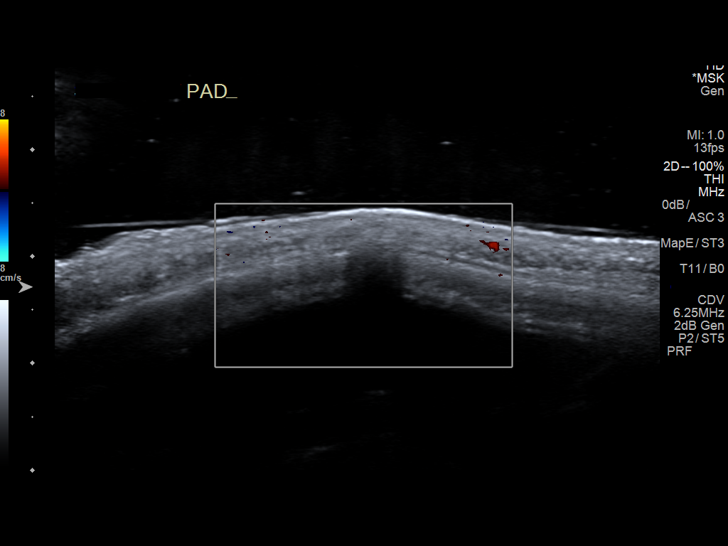
[im 16/20]
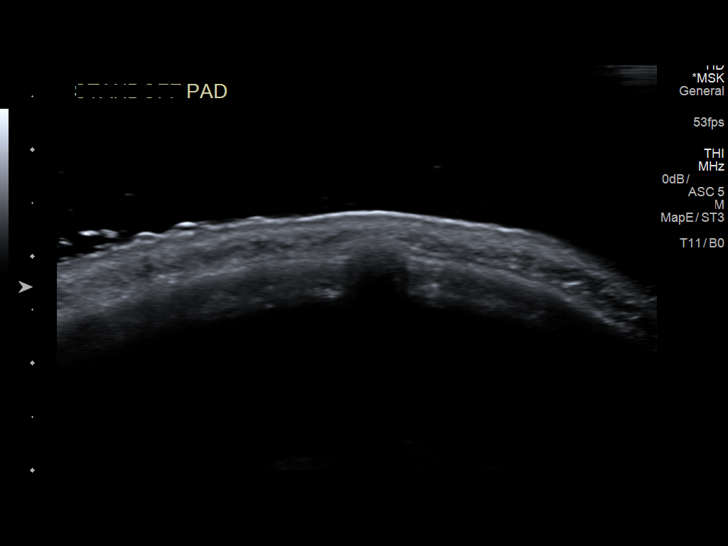
[im 17/20]
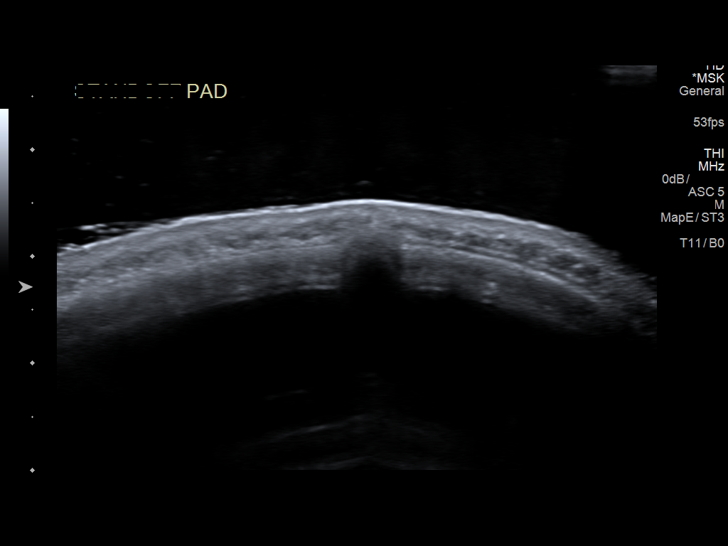
[im 18/20]
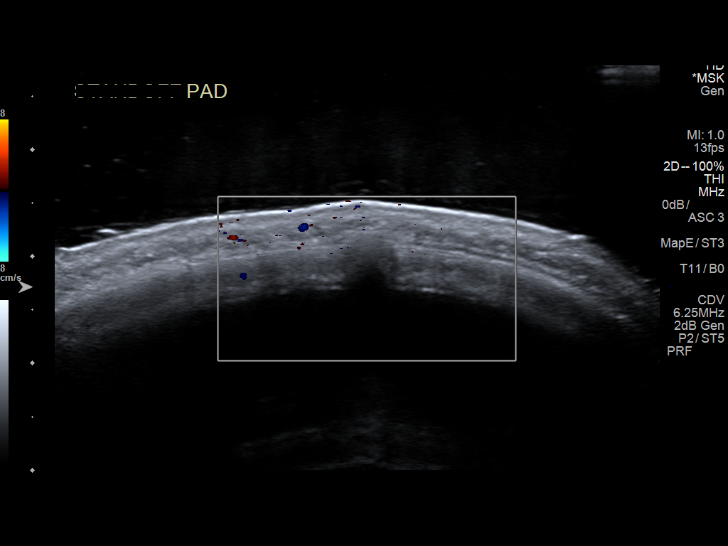
[im 20/20]
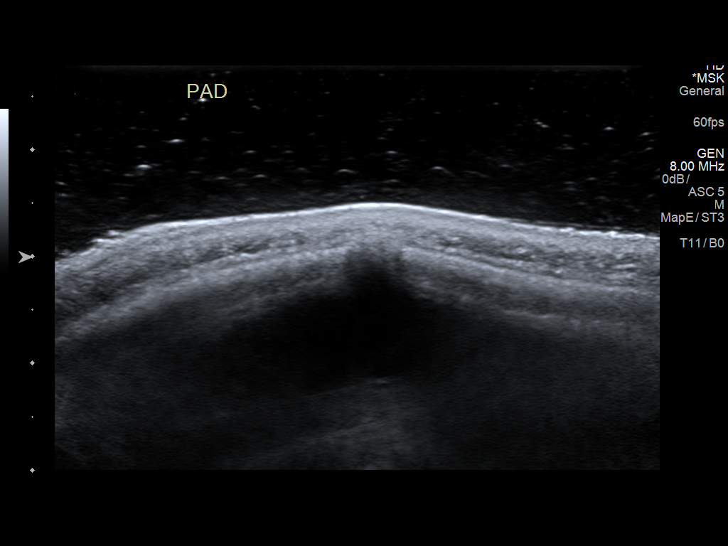

[14 of 20 positions shown; findings below may reference images not displayed]

FINDINGS: 6 mm contour deformity centered in the calvarium with posterior
hypoechoic appearance that is presumably denser shadowing than the
adjacent calvarium. The neighboring soft tissues are negative.
IMPRESSION: Palpable complaint is calvarial, favor an osteoma. Given sonography
is limited in evaluating bony structures, a CT without contrast
could be obtained for definitive characterization. This would be
recommended especially if there is clinical growth.

## 2020-03-13 ENCOUNTER — Other Ambulatory Visit: Payer: Self-pay | Admitting: Family Medicine

## 2020-03-13 ENCOUNTER — Encounter: Payer: Self-pay | Admitting: Family Medicine

## 2020-03-13 ENCOUNTER — Ambulatory Visit (INDEPENDENT_AMBULATORY_CARE_PROVIDER_SITE_OTHER): Payer: No Typology Code available for payment source

## 2020-03-13 ENCOUNTER — Other Ambulatory Visit: Payer: Self-pay

## 2020-03-13 ENCOUNTER — Ambulatory Visit (INDEPENDENT_AMBULATORY_CARE_PROVIDER_SITE_OTHER): Payer: No Typology Code available for payment source | Admitting: Family Medicine

## 2020-03-13 VITALS — BP 118/78 | HR 83 | Resp 17 | Ht 68.0 in | Wt 227.0 lb

## 2020-03-13 DIAGNOSIS — I8001 Phlebitis and thrombophlebitis of superficial vessels of right lower extremity: Secondary | ICD-10-CM

## 2020-03-13 DIAGNOSIS — Z5181 Encounter for therapeutic drug level monitoring: Secondary | ICD-10-CM | POA: Diagnosis not present

## 2020-03-13 DIAGNOSIS — R2241 Localized swelling, mass and lump, right lower limb: Secondary | ICD-10-CM

## 2020-03-13 MED ORDER — RIVAROXABAN 15 MG PO TABS: 15.0000 mg | ORAL_TABLET | Freq: Two times a day (BID) | ORAL | 0 refills | Status: DC

## 2020-03-13 MED ORDER — RIVAROXABAN 20 MG PO TABS: 20.0000 mg | ORAL_TABLET | Freq: Every day | ORAL | 1 refills | Status: DC

## 2020-03-13 NOTE — Progress Notes (Addendum)
[facility] [address], [address] [place], [place] [address] [phone]  Date:  03/13/2020   Name:  Kathryn Pineda   DOB:  [birth year]   MRN:  [id number]  PCP:  Pearline Cables, MD    Chief Complaint: Leg Pain (Right leg swelling, redness, blood clot hx after vaccine)   History of Present Illness:  Kathryn Pineda is a 65 y.o. very pleasant female patient who presents with the following:  Pt here today with concern of leg redness and swelling  History of DVT dx 12/20, obesity status post lap band. She had a DVT about one year ago- was on xarelto for 3 months and then discontinued   About 10 days ago she noted sx of headache, sinus congestion, low grade fever and chills This seemed to resolve and she was feeling better She got tested twice and was negative  During her illness she noted some cramping some in her legs Then, about a week ago she noted a possible hard spot in her right leg When she took her stocking off last night she noted some pain and swelling in her lateral right leg NKI  No SOB     Patient Active Problem List   Diagnosis Date Noted  . DVT (deep venous thrombosis) (HCC) 02/21/2019  . Rectocele 11/13/2016  . Incomplete uterine prolapse 11/13/2016  . Cystocele, midline 11/13/2016  . Seasonal allergies 09/17/2014  . Incontinence   . Obesity (BMI 30-39.9) 12/24/2010  . Hx of laparoscopic gastric banding 09/23/2010    Past Medical History:  Diagnosis Date  . Anal fissure   . Arthritis   . Biliary colic 5/96   ERCP w/spincterotomy  . Condyloma   . Deep vein thrombosis (DVT) of lower extremity (HCC)   . Fatigue    loss of sleep  . Generalized headaches   . GERD (gastroesophageal reflux disease)   . Hemorrhoid   . Hiatal hernia   . Migraines   . Obesity   . Phlebitis of leg   . PONV (postoperative nausea and vomiting)   . Primary osteoarthritis of both knees   . Stress incontinence   .  Wilson's disease     Past Surgical History:  Procedure Laterality Date  . ANAL FISSURECTOMY  1986  . ANTERIOR AND POSTERIOR REPAIR N/A 11/24/2016   Procedure: ANTERIOR (CYSTOCELE) AND POSTERIOR REPAIR (RECTOCELE);  Surgeon: Alfredo Martinez, MD;  Location: [facility];  Service: Urology;  Laterality: N/A;  . BUNIONECTOMY Bilateral 2005  . CESAREAN SECTION  1990   prolapsed cord  . CYSTOSCOPY N/A 11/24/2016   Procedure: CYSTOSCOPY;  Surgeon: Alfredo Martinez, MD;  Location: [facility];  Service: Urology;  Laterality: N/A;  . ENDOVENOUS ABLATION SAPHENOUS VEIN W/ LASER  2008   both lower legs  . LAPAROSCOPIC CHOLECYSTECTOMY  1997  . LAPAROSCOPIC GASTRIC SLEEVE RESECTION WITH HIATAL HERNIA REPAIR  09/16/2010  . LAPAROSCOPIC VAGINAL HYSTERECTOMY WITH SALPINGECTOMY Bilateral 11/24/2016   Procedure: LAPAROSCOPIC ASSISTED VAGINAL HYSTERECTOMY WITH SALPINGO-OOPHORECTOMY;  Surgeon: [name], MD;  Location: [facility];  Service: Gynecology;  Laterality: Bilateral;    Social History   Tobacco Use  . Smoking status: Never Smoker  . Smokeless tobacco: Never Used  Vaping Use  . Vaping Use: Never used  Substance Use Topics  . Alcohol use: Yes    Alcohol/week: 1.0 standard drink    Types: 1 Glasses of wine per week  . Drug use: No  Family History  Problem Relation Age of Onset  . Diabetes Father   . Colon polyps Father   . Osteoarthritis Mother   . Hypertension Brother   . Cancer Maternal Uncle        leukemia  . Cancer Maternal Grandmother        unaware    Allergies  Allergen Reactions  . Penicillins Itching and Rash  . Adhesive [Tape] Other (See Comments)    Band-Aid causes blisters    Medication list has been reviewed and updated.  Current Outpatient Medications on File Prior to Visit  Medication Sig Dispense Refill  . b complex vitamins capsule Take 1 capsule by mouth daily.    . Cholecalciferol (VITAMIN D PO) Take 2,000 Units by mouth daily.     Marland Kitchen loratadine (CLARITIN) 10  MG tablet Take 10 mg by mouth daily as needed.     . Multiple Vitamin (MULTIVITAMIN) tablet Take 1 tablet by mouth every other day.    . Omega-3 Fatty Acids (FISH OIL) 1000 MG CAPS Take by mouth.    . psyllium (METAMUCIL) 58.6 % packet Take 1 packet daily as needed by mouth.     No current facility-administered medications on file prior to visit.    Review of Systems:  As per HPI- otherwise negative. No SOB  Physical Examination: Vitals:   03/13/20 1306  BP: 118/78  Pulse: 83  Resp: 17  SpO2: 99%   Vitals:   03/13/20 1306  Weight: 227 lb (103 kg)  Height: 5\' 8"  (1.727 m)   Body mass index is 34.52 kg/m. Ideal Body Weight: Weight in (lb) to have BMI = 25: 164.1  GEN: no acute distress. HEENT: Atraumatic, Normocephalic.  Ears and Nose: No external deformity. CV: RRR, No M/G/R. No JVD. No thrill. No extra heart sounds. PULM: CTA B, no wheezes, crackles, rhonchi. No retractions. No resp. distress. No accessory muscle use. ABD: S, NT, ND, +BS. No rebound. No HSM. EXTR: No c/c/e PSYCH: Normally interactive. Conversant.  Right LE is larger in diameter c/w left, there is a fir and tender area to the lateral calf   Assessment and Plan: Medication monitoring encounter - Plan: CBC, Comprehensive metabolic panel  Localized swelling of right lower leg - Plan: Venous Img Lower Unilateral Right  Suspect DVT  Probable recurrent DVT right  Given xarelto 15 mg  This visit occurred during the SARS-CoV-2 public health emergency.  Safety protocols were in place, including screening questions prior to the visit, additional usage of staff PPE, and extensive cleaning of exam room while observing appropriate contact time as indicated for disinfecting solutions.    Signed [place], MD  Received her ultrasound as below.  No DVT, she does have superficial thrombophlebitis of the varicosities.  Because this is significantly symptomatic, we may need to anticoagulate her  temporarily.  I will have her start on Xarelto 15 twice daily for the time being, will consult with hematology and get their opinion Patient states understanding and agreement with plan Abbe Amsterdam Venous Img Lower Unilateral Right  Result Date: 03/13/2020 CLINICAL DATA:  Right leg swelling EXAM: RIGHT LOWER EXTREMITY VENOUS DOPPLER ULTRASOUND TECHNIQUE: Gray-scale sonography with graded compression, as well as color Doppler and duplex ultrasound were performed to evaluate the lower extremity deep venous systems from the level of the common femoral vein and including the common femoral, femoral, profunda femoral, popliteal and calf veins including the posterior tibial, peroneal and gastrocnemius veins when visible. The superficial great saphenous vein  was also interrogated. Spectral Doppler was utilized to evaluate flow at rest and with distal augmentation maneuvers in the common femoral, femoral and popliteal veins. COMPARISON:  02/15/2019 FINDINGS: Contralateral Common Femoral Vein: Respiratory phasicity is normal and symmetric with the symptomatic side. No evidence of thrombus. Normal compressibility. Common Femoral Vein: No evidence of thrombus. Normal compressibility, respiratory phasicity and response to augmentation. Saphenofemoral Junction: No evidence of thrombus. Normal compressibility and flow on color Doppler imaging. Profunda Femoral Vein: No evidence of thrombus. Normal compressibility and flow on color Doppler imaging. Femoral Vein: No evidence of thrombus. Normal compressibility, respiratory phasicity and response to augmentation. Popliteal Vein: No evidence of thrombus. Normal compressibility, respiratory phasicity and response to augmentation. Calf Veins: No evidence of thrombus. Normal compressibility and flow on color Doppler imaging. Superficial Great Saphenous Vein: No evidence of thrombus. Normal compressibility. Venous Reflux:  None. Other Findings: Superficial thrombus is noted within  varicosities in the lateral lower leg in the patient's area of focal swelling. IMPRESSION: No evidence of deep venous thrombosis. Superficial thrombophlebitis in the lateral varicosities is seen. Electronically Signed   By: Alcide Clever M.D.   On: 03/13/2020 15:57   I reached out to hematology for advice regarding anticoagulation.  We are in agreement that 3 months of anticoagulation is appropriate in this case-message to patient   Received her labs as below, 1/27-message to patient  Results for orders placed or performed in visit on 03/13/20  CBC  Result Value Ref Range   WBC 5.6 4.0 - 10.5 K/uL   RBC 4.20 3.87 - 5.11 Mil/uL   Platelets 258.0 150.0 - 400.0 K/uL   Hemoglobin 13.3 12.0 - 15.0 g/dL   HCT 18.8 41.6 - 60.6 %   MCV 93.2 78.0 - 100.0 fl   MCHC 33.9 30.0 - 36.0 g/dL   RDW 30.1 60.1 - 09.3 %  Comprehensive metabolic panel  Result Value Ref Range   Sodium 139 135 - 145 mEq/L   Potassium 4.1 3.5 - 5.1 mEq/L   Chloride 103 96 - 112 mEq/L   CO2 29 19 - 32 mEq/L   Glucose, Bld 115 (H) 70 - 99 mg/dL   BUN 13 6 - 23 mg/dL   Creatinine, Ser 2.35 0.40 - 1.20 mg/dL   Total Bilirubin 0.7 0.2 - 1.2 mg/dL   Alkaline Phosphatase 47 39 - 117 U/L   AST 15 0 - 37 U/L   ALT 10 0 - 35 U/L   Total Protein 6.7 6.0 - 8.3 g/dL   Albumin 4.4 3.5 - 5.2 g/dL   GFR 57.32 >20.25 mL/min   Calcium 9.6 8.4 - 10.5 mg/dL

## 2020-03-13 NOTE — Patient Instructions (Signed)
Please proceed to the [facility]- go though the main doors at the back, [address] is imaging  They will get your ultrasound for you- scheduled for 3pm Please get your blood drawn here prior to leaving I gave you 2 sample bottles of xarelto 15 mg for now- IF a clot start taking this twice a day Will take 15 twice a day for 3 weeks then change to 20 once a day   Address: [address] [place], [place] [address]  Phone: [phone]

## 2020-03-13 NOTE — Addendum Note (Signed)
Addended by: Pearline Cables on: 03/13/2020 06:18 PM   Modules accepted: Orders

## 2020-03-14 ENCOUNTER — Encounter: Payer: Self-pay | Admitting: Family Medicine

## 2020-03-14 LAB — COMPREHENSIVE METABOLIC PANEL
ALT: 10 U/L (ref 0–35)
AST: 15 U/L (ref 0–37)
Albumin: 4.4 g/dL (ref 3.5–5.2)
Alkaline Phosphatase: 47 U/L (ref 39–117)
BUN: 13 mg/dL (ref 6–23)
CO2: 29 mEq/L (ref 19–32)
Calcium: 9.6 mg/dL (ref 8.4–10.5)
Chloride: 103 mEq/L (ref 96–112)
Creatinine, Ser: 0.79 mg/dL (ref 0.40–1.20)
GFR: 80.07 mL/min (ref >60.00)
Glucose, Bld: 115 mg/dL — ABNORMAL HIGH (ref 70–99)
Potassium: 4.1 mEq/L (ref 3.5–5.1)
Sodium: 139 mEq/L (ref 135–145)
Total Bilirubin: 0.7 mg/dL (ref 0.2–1.2)
Total Protein: 6.7 g/dL (ref 6.0–8.3)

## 2020-03-14 LAB — CBC
HCT: 39.1 % (ref 36.0–46.0)
Hemoglobin: 13.3 g/dL (ref 12.0–15.0)
MCHC: 33.9 g/dL (ref 30.0–36.0)
MCV: 93.2 fl (ref 78.0–100.0)
Platelets: 258 10*3/uL (ref 150.0–400.0)
RBC: 4.2 Mil/uL (ref 3.87–5.11)
RDW: 13.3 % (ref 11.5–15.5)
WBC: 5.6 10*3/uL (ref 4.0–10.5)

## 2020-03-14 MED FILL — XARELTO 15 MG TABLET: 15 | 12 days supply | Qty: 24 | Fill #0

## 2020-03-29 MED FILL — XARELTO 20 MG TABLET: 20 | 30 days supply | Qty: 30 | Fill #0

## 2020-04-25 MED FILL — XARELTO 20 MG TABLET: 20 | 30 days supply | Qty: 30 | Fill #1

## 2020-06-11 ENCOUNTER — Encounter: Payer: Self-pay | Admitting: Family Medicine

## 2020-06-11 ENCOUNTER — Telehealth: Payer: Self-pay | Admitting: Family Medicine

## 2020-06-11 DIAGNOSIS — Z1211 Encounter for screening for malignant neoplasm of colon: Secondary | ICD-10-CM

## 2020-06-11 DIAGNOSIS — Z1283 Encounter for screening for malignant neoplasm of skin: Secondary | ICD-10-CM

## 2020-06-11 NOTE — Telephone Encounter (Signed)
Caller Jone Panebianco  Call Back @ [phone]  Patient has [facility] Plan.   Patient would like referral to Vida Rigger MD, Gastroenterology  for colonoscopy.   Patient would also like a referral for [facility] .  An for Shingles Vaccine   And wants to know if she should take a low dose Asprin b/c she has stop taking blood thinner.   Please advise

## 2020-06-11 NOTE — Telephone Encounter (Signed)
I have pended referral for dermatology and GI. Could you please advise on if ok for shingrix to be done and aspirin instruction.

## 2020-07-04 ENCOUNTER — Ambulatory Visit: Payer: No Typology Code available for payment source

## 2020-07-10 ENCOUNTER — Other Ambulatory Visit: Payer: Self-pay | Admitting: Obstetrics & Gynecology

## 2020-07-10 DIAGNOSIS — Z1231 Encounter for screening mammogram for malignant neoplasm of breast: Secondary | ICD-10-CM

## 2020-07-11 ENCOUNTER — Encounter (HOSPITAL_BASED_OUTPATIENT_CLINIC_OR_DEPARTMENT_OTHER): Payer: Self-pay | Admitting: Obstetrics & Gynecology

## 2020-07-11 ENCOUNTER — Ambulatory Visit (INDEPENDENT_AMBULATORY_CARE_PROVIDER_SITE_OTHER): Payer: No Typology Code available for payment source | Admitting: Obstetrics & Gynecology

## 2020-07-11 ENCOUNTER — Other Ambulatory Visit: Payer: Self-pay

## 2020-07-11 VITALS — BP 113/55 | HR 75 | Ht 68.0 in | Wt 221.4 lb

## 2020-07-11 DIAGNOSIS — Z780 Asymptomatic menopausal state: Secondary | ICD-10-CM | POA: Diagnosis not present

## 2020-07-11 DIAGNOSIS — I82401 Acute embolism and thrombosis of unspecified deep veins of right lower extremity: Secondary | ICD-10-CM | POA: Diagnosis not present

## 2020-07-11 DIAGNOSIS — Z01419 Encounter for gynecological examination (general) (routine) without abnormal findings: Secondary | ICD-10-CM | POA: Diagnosis not present

## 2020-07-11 DIAGNOSIS — Z86010 Personal history of colonic polyps: Secondary | ICD-10-CM

## 2020-07-11 DIAGNOSIS — R3915 Urgency of urination: Secondary | ICD-10-CM | POA: Diagnosis not present

## 2020-07-11 DIAGNOSIS — Z90710 Acquired absence of both cervix and uterus: Secondary | ICD-10-CM

## 2020-07-11 DIAGNOSIS — R151 Fecal smearing: Secondary | ICD-10-CM

## 2020-07-11 LAB — POCT URINALYSIS DIPSTICK
Appearance: NORMAL
Bilirubin, UA: NEGATIVE
Glucose, UA: NEGATIVE
Ketones, UA: NEGATIVE
Leukocytes, UA: NEGATIVE
Nitrite, UA: NEGATIVE
Protein, UA: NEGATIVE
Spec Grav, UA: 1.025 (ref 1.010–1.025)
Urobilinogen, UA: 0.2 E.U./dL
pH, UA: 5.5 (ref 5.0–8.0)

## 2020-07-11 NOTE — Addendum Note (Signed)
Addended by: Harrie Jeans on: 07/11/2020 01:17 PM   Modules accepted: Orders

## 2020-07-11 NOTE — Progress Notes (Signed)
65 y.o. G53P4003 Married White or Caucasian female here for annual exam.  Had Covid in early May.  Feeling ok.    Denies vaginal bleeding.    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    No LMP recorded. Patient has had a hysterectomy.          Sexually active: Yes.    The current method of family planning is post menopausal status.    Exercising: yes, teaching exercise class Smoker:  no  Health Maintenance: Pap:  Not indicated History of abnormal Pap:  no MMG:  07/2019 Colonoscopy:  03/11/2015, follow up 5 years recommended.  Pt aware this is due. Pt has caleld BMD:   08/02/2019 TDaP:  2012, pt is due this year Pneumonia vaccine(s):  Not indicated Shingrix:   Has not started Hep C testing: 03/06/2016 Screening Labs: done with Dr. Patsy Lager   reports that she has never smoked. She has never used smokeless tobacco. She reports current alcohol use of about 1.0 standard drink of alcohol per week. She reports that she does not use drugs.  Past Medical History:  Diagnosis Date  . Anal fissure   . Arthritis   . Biliary colic 5/96   ERCP w/spincterotomy  . Condyloma   . Deep vein thrombosis (DVT) of lower extremity (HCC)   . Fatigue    loss of sleep  . Generalized headaches   . GERD (gastroesophageal reflux disease)   . Hemorrhoid   . Hiatal hernia   . Migraines   . Obesity   . Phlebitis of leg   . PONV (postoperative nausea and vomiting)   . Primary osteoarthritis of both knees   . Stress incontinence   . Wilson's disease     Past Surgical History:  Procedure Laterality Date  . ANAL FISSURECTOMY  1986  . ANTERIOR AND POSTERIOR REPAIR N/A 11/24/2016   Procedure: ANTERIOR (CYSTOCELE) AND POSTERIOR REPAIR (RECTOCELE);  Surgeon: Alfredo Martinez, MD;  Location: [facility];  Service: Urology;  Laterality: N/A;  . BUNIONECTOMY Bilateral 2005  . CESAREAN SECTION  1990   prolapsed cord  . CYSTOSCOPY N/A 11/24/2016   Procedure: CYSTOSCOPY;  Surgeon: Alfredo Martinez,  MD;  Location: [facility];  Service: Urology;  Laterality: N/A;  . ENDOVENOUS ABLATION SAPHENOUS VEIN W/ LASER  2008   both lower legs  . LAPAROSCOPIC CHOLECYSTECTOMY  1997  . LAPAROSCOPIC GASTRIC SLEEVE RESECTION WITH HIATAL HERNIA REPAIR  09/16/2010  . LAPAROSCOPIC VAGINAL HYSTERECTOMY WITH SALPINGECTOMY Bilateral 11/24/2016   Procedure: LAPAROSCOPIC ASSISTED VAGINAL HYSTERECTOMY WITH SALPINGO-OOPHORECTOMY;  Surgeon: [name], MD;  Location: [facility];  Service: Gynecology;  Laterality: Bilateral;    Current Outpatient Medications  Medication Sig Dispense Refill  . aspirin EC 81 MG tablet Take 81 mg by mouth daily. Swallow whole.    . loratadine (CLARITIN) 10 MG tablet Take 10 mg by mouth daily as needed.     . psyllium (METAMUCIL) 58.6 % packet Take 1 packet daily as needed by mouth.    Marland Kitchen b complex vitamins capsule Take 1 capsule by mouth daily. (Patient not taking: Reported on 07/11/2020)    . Cholecalciferol (VITAMIN D PO) Take 2,000 Units by mouth daily.  (Patient not taking: Reported on 07/11/2020)    . Multiple Vitamin (MULTIVITAMIN) tablet Take 1 tablet by mouth every other day. (Patient not taking: Reported on 07/11/2020)    . Omega-3 Fatty Acids (FISH OIL) 1000 MG CAPS Take by mouth. (Patient not taking: Reported on 07/11/2020)  No current facility-administered medications for this visit.    Family History  Problem Relation Age of Onset  . Diabetes Father   . Colon polyps Father   . Osteoarthritis Mother   . Hypertension Brother   . Cancer Maternal Uncle        leukemia  . Cancer Maternal Grandmother        unaware    Review of Systems  All other systems reviewed and are negative.   Exam:   BP (!) 113/55 (BP Location: Left Arm, Patient Position: Sitting, Cuff Size: Large)   Pulse 75   Ht 5\' 8"  (1.727 m)   Wt 221 lb 6.4 oz (100.4 kg)   BMI 33.66 kg/m   Height: 5\' 8"  (172.7 cm)  General appearance: alert, cooperative and appears stated age Head: Normocephalic,  without obvious abnormality, atraumatic Neck: no adenopathy, supple, symmetrical, trachea midline and thyroid normal to inspection and palpation Lungs: clear to auscultation bilaterally Breasts: normal appearance, no masses or tenderness Heart: regular rate and rhythm Abdomen: soft, non-tender; bowel sounds normal; no masses,  no organomegaly Extremities: extremities normal, atraumatic, no cyanosis or edema Skin: Skin color, texture, turgor normal. No rashes or lesions Lymph nodes: Cervical, supraclavicular, and axillary nodes normal. No abnormal inguinal nodes palpated Neurologic: Grossly normal   Pelvic: External genitalia:  no lesions              Urethra:  normal appearing urethra with no masses, tenderness or lesions              Bartholins and Skenes: normal                 Vagina: normal appearing vagina with normal color and no discharge, no lesions              Cervix: absent              Pap taken: No. Bimanual Exam:  Uterus:  uterus absent              Adnexa: no mass, fullness, tenderness               Rectovaginal: Confirms               Anus:  normal sphincter tone, no lesions  Chaperone, , CMA, was present for exam.  Assessment/Plan: 1. Well woman exam with routine gynecological exam - pap smear not indicated - MMG scheduled 09/06/2020 - colonoscopy due.  Pt aware. - BMD normal 2021 - Dr. 09/08/2020 does lab work - vaccines updated  2. Postmenopausal - no HRT  3. Acute deep vein thrombosis (DVT) of right lower extremity, unspecified vein (HCC) - on baby ASA  4. Personal history of colonic polyps  5. H/O: hysterectomy  6. Urinary urgency  7. Fecal smearing - pelvic PT referral placed

## 2020-07-25 ENCOUNTER — Telehealth: Payer: Self-pay | Admitting: Family Medicine

## 2020-07-25 NOTE — Telephone Encounter (Signed)
Open error 

## 2020-08-28 ENCOUNTER — Telehealth: Payer: Self-pay | Admitting: *Deleted

## 2020-08-28 ENCOUNTER — Other Ambulatory Visit (HOSPITAL_BASED_OUTPATIENT_CLINIC_OR_DEPARTMENT_OTHER): Payer: Self-pay

## 2020-08-28 DIAGNOSIS — Z23 Encounter for immunization: Secondary | ICD-10-CM

## 2020-08-28 NOTE — Telephone Encounter (Signed)
I have called pt to see if she wanted to get scheduled an appointment for follow up and she stated that she wanted to get all her appointments and procedures done then call back. I stated understanding. Pt is wanting to get her shingrix vaccine and I have send it to downstairs pharmacy for her since her old rx expired.

## 2020-08-28 NOTE — Telephone Encounter (Signed)
Caller Name Kayren Holck Caller Phone Number [phone] Patient Name Kathryn Pineda Patient DOB 1955/06/19 Call Type Message Only Information Provided Reason for Call Request for General Office Information Initial Comment Caller states she would like an order for the shingles vaccine. Additional Comment Provided hours. Disp. Time Disposition Final User 08/28/2020 12:34:25 PM General Information Provided Yes Susanne Greenhouse

## 2020-08-29 ENCOUNTER — Encounter: Payer: Self-pay | Admitting: Physical Therapy

## 2020-08-29 ENCOUNTER — Other Ambulatory Visit: Payer: Self-pay

## 2020-08-29 ENCOUNTER — Ambulatory Visit: Payer: No Typology Code available for payment source | Attending: Obstetrics & Gynecology | Admitting: Physical Therapy

## 2020-08-29 DIAGNOSIS — M6281 Muscle weakness (generalized): Secondary | ICD-10-CM | POA: Diagnosis present

## 2020-08-29 DIAGNOSIS — R252 Cramp and spasm: Secondary | ICD-10-CM | POA: Diagnosis present

## 2020-08-29 DIAGNOSIS — R278 Other lack of coordination: Secondary | ICD-10-CM | POA: Diagnosis present

## 2020-08-29 DIAGNOSIS — R151 Fecal smearing: Secondary | ICD-10-CM | POA: Diagnosis present

## 2020-08-29 DIAGNOSIS — M533 Sacrococcygeal disorders, not elsewhere classified: Secondary | ICD-10-CM | POA: Diagnosis present

## 2020-08-29 NOTE — Patient Instructions (Signed)
Access Code: [email or web address] URL: [email or web address] Date: 08/29/2020 Prepared by: Eulis Foster  Exercises Sidelying Pelvic Floor Contraction with Self-Palpation - 3 x daily - 7 x weekly - 1 sets - 5 reps - 2 sec hold Positioning on Toilet and Defecation Technique - 1 x daily - 7 x weekly - 3 sets - 10 reps  [facility] [address] [place], [place] [address] Phone # [phone] Fax [phone]

## 2020-08-29 NOTE — Therapy (Signed)
[facility] [address], [address] [place], [place], [address] Phone: [phone]   Fax:  [phone]  Physical Therapy Evaluation  Patient Details  Name: Kathryn Pineda MRN: [id number] Date of Birth: March 13, 1959 Referring Provider (PT): Dr, Valentina Shaggy   Encounter Date: 08/29/2020   PT End of Session - 08/29/20 1003     Visit Number 1    Date for PT Re-Evaluation 12/30/20    Authorization Type UMR    PT Start Time 0930    PT Stop Time 1015    PT Time Calculation (min) 45 min    Activity Tolerance Patient tolerated treatment well    Behavior During Therapy [facility] for tasks assessed/performed             Past Medical History:  Diagnosis Date   Anal fissure    Arthritis    Biliary colic 5/96   ERCP w/spincterotomy   Condyloma    Deep vein thrombosis (DVT) of lower extremity (HCC)    Fatigue    loss of sleep   Generalized headaches    GERD (gastroesophageal reflux disease)    Hemorrhoid    Hiatal hernia    Migraines    Obesity    Phlebitis of leg    PONV (postoperative nausea and vomiting)    Primary osteoarthritis of both knees    Stress incontinence    Wilson's disease     Past Surgical History:  Procedure Laterality Date   ANAL FISSURECTOMY  1986   ANTERIOR AND POSTERIOR REPAIR N/A 11/24/2016   Procedure: ANTERIOR (CYSTOCELE) AND POSTERIOR REPAIR (RECTOCELE);  Surgeon: Alfredo Martinez, MD;  Location: [facility];  Service: Urology;  Laterality: N/A;   BUNIONECTOMY Bilateral 2005   CESAREAN SECTION  1990   prolapsed cord   CYSTOSCOPY N/A 11/24/2016   Procedure: CYSTOSCOPY;  Surgeon: Alfredo Martinez, MD;  Location: [facility];  Service: Urology;  Laterality: N/A;   ENDOVENOUS ABLATION SAPHENOUS VEIN W/ LASER  2008   both lower legs   LAPAROSCOPIC CHOLECYSTECTOMY  1997   LAPAROSCOPIC GASTRIC SLEEVE RESECTION WITH HIATAL HERNIA REPAIR  09/16/2010   LAPAROSCOPIC VAGINAL HYSTERECTOMY WITH SALPINGECTOMY Bilateral  11/24/2016   Procedure: LAPAROSCOPIC ASSISTED VAGINAL HYSTERECTOMY WITH SALPINGO-OOPHORECTOMY;  Surgeon: [name], MD;  Location: [facility];  Service: Gynecology;  Laterality: Bilateral;    There were no vitals filed for this visit.    Subjective Assessment - 08/29/20 0935     Subjective Patient has to wear a pad due a  smear on the pad. Patient has a looser stool and will take Metamucil for that. Patient in the past had a fisurrectomy but has not had a good seal. She has had large babies. Patient had anterior and posterior repair 2 years ago. She felt something loosens in the rectum She feels the rectum turn inside out. She still has hemorroids. Paitent does not have tone in the rectum. When has constipation witll leak urine. does ot fully empty her stool and has to  go to the bathroom many times.    Patient Stated Goals reduce the fecal smearing    Currently in Pain? Yes    Pain Score 4     Pain Location Coccyx    Pain Orientation Mid    Pain Descriptors / Indicators Aching    Pain Type Chronic pain    Pain Onset More than a month ago    Pain Frequency Intermittent    Aggravating Factors  after exercise , Dead  lift    Pain Relieving Factors massage the tailbone, pelvic tilt    Multiple Pain Sites Yes    Pain Score 6    Pain Location Abdomen    Pain Orientation Anterior    Pain Descriptors / Indicators Burning;Sharp;Stabbing    Pain Type Chronic pain    Pain Onset More than a month ago    Pain Frequency Intermittent    Aggravating Factors  foods, build up of gas    Pain Relieving Factors TUMS                [facility] PT Assessment - 08/29/20 0001       Assessment   Medical Diagnosis R151 fecal smearing    Referring Provider (PT) Dr, Valentina Shaggy    Onset Date/Surgical Date --   2 years ago   Prior Therapy none      Precautions   Precautions None      Restrictions   Weight Bearing Restrictions No      Balance Screen   Has the patient fallen in the past 6 months No     Has the patient had a decrease in activity level because of a fear of falling?  No    Is the patient reluctant to leave their home because of a fear of falling?  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    Leisure exercise, reading, watch TV      Cognition   Overall Cognitive Status Within Functional Limits for tasks assessed      Observation/Other Assessments   Skin Integrity decreased mobilitiy of th ec-section scar      Posture/Postural Control   Posture/Postural Control No significant limitations      ROM / Strength   AROM / PROM / Strength AROM;PROM;Strength      AROM   Lumbar Flexion tightness in the lumbar    Lumbar Extension decreased by 50%      Strength   Right Hip ABduction 4/5    Left Hip Extension 4-/5    Left Hip ABduction 5/5    Left Hip ADduction 4/5      Palpation   Palpation comment tenderness located below the umbilicus, right side of the coccygeus                        Objective measurements completed on examination: See above findings.     Pelvic Floor Special Questions - 08/29/20 0001     Prior Pregnancies Yes    Number of Pregnancies 4    Number of C-Sections 1    Number of Vaginal Deliveries 3    Any difficulty with labor and deliveries Yes   2nd degree, had a fissure to be repaired   Diastasis Recti 1 finger diastasis above umbilicus    Currently Sexually Active Yes    Is this Painful Yes   initial penetration  pain level 7/10, vaginal dryness   Marinoff Scale pain prevents any attempts at intercourse    Urinary Leakage Yes    Activities that cause leaking Other    Other activities that cause leaking when constipated    Urinary urgency Yes   once in awhile   Fecal incontinence Yes    Fluid intake drinking more water    Caffeine  beverages 1 coffee in the morning    Skin  Integrity Intact    Perineal Body/Introitus  Elevated    Prolapse None    Pelvic Floor Internal Exam Patient confirms identification and approves PT to assess pelvic floor and treatment    Exam Type Vaginal;Rectal    Palpation tightness and pain on the anococcygeal ligament, obturator internist, along the post. vaginal canal wall, iliococcygeus; decreased movement of the left urethra sphincter; anally she has 1/5 contraction of the IAS and EAS; The puborectalis does not come forward with contraction but after release around the coccyx it was able to;    Strength weak squeeze, no lift   rectally 1/5                     PT Education - 08/29/20 1025     Education Details Access Code: [email or web address]; instructed patient on using olive oil vaginally and into the canal for dryness    Person(s) Educated Patient    Methods Explanation;Demonstration;Handout    Comprehension Returned demonstration;Verbalized understanding              PT Short Term Goals - 08/29/20 1042       PT SHORT TERM GOAL #1   Title independent with initial HEP    Time 4    Period Weeks    Status New    Target Date 09/26/20      PT SHORT TERM GOAL #2   Title understand correct toileting technique to fully empty her bowels    Time 4    Period Weeks    Status New    Target Date 09/26/20      PT SHORT TERM GOAL #3   Title instructed on abdominal massage to reduce fascial restrictions and pain    Time 4    Period Weeks    Status New    Target Date 09/26/20      PT SHORT TERM GOAL #4   Title pelvic floor strength >/= 2/5 holding for 10 seconds and fully relax after the contraction    Time 4    Period Weeks    Status New    Target Date 09/26/20               PT Long Term Goals - 08/29/20 1044       PT LONG TERM GOAL #1   Title Independent with advanced HEP for core and pelvic floor strength to reduce fecal smearing    Time 4    Period Months    Status New    Target Date 12/30/20       PT LONG TERM GOAL #2   Title pelvic floor strength is >/= 3/5 holding for 10 seconds to reduce her wearing a pad to none    Time 4    Period Months    Status New    Target Date 12/30/20      PT LONG TERM GOAL #3   Title able to fully empty her stool due to improve pelvic floor coordination with contraction and full relaxation    Time 4    Period Months    Status New    Target Date 12/30/20      PT LONG TERM GOAL #4   Title abdominal pain decreased >/= 75% due to improved mobility of stool using the abodminal massage and reduction of fascial restrictions    Time 4    Period Months    Status New  Target Date 12/30/20      PT LONG TERM GOAL #5   Title coccyx pain reduce from minimal to none due to improved mobility of the coccyx and reduction of trigger points in the pelvic floor muscles.    Time 4    Period Months    Status New    Target Date 12/30/20      Additional Long Term Goals   Additional Long Term Goals Yes      PT LONG TERM GOAL #6   Title able to have penile penetration vaginally with pain decreased </= 0-1 due to improved mobiity of the vaginal tissue and reduction of dryness    Time 4    Period Months    Status New    Target Date 12/30/20                    Plan - 08/29/20 1003     Clinical Impression Statement Patient is a 65 year old female with fecal smearing for the past 2 years. Patient reports after her anterior and posterior vaginal repair she has had pain at level 7/10 vaginally with penile penetration and has only been able to have intercourse 1 time. Patient reports intermittent coccyx pain at 4/10 and abdominal pain 6/10. Patient has palpable tenderness located below the umbilicus, bilateral levator ani, obturator internist, right coccygeus, and anococcygeal ligament. The coccyx is sidebend to the right. Pelvic floor strength is 2/5 for the vaginal but 1/5 for the IAS and EAS. After the therapist released the anococcyxgeal ligament the  puborectalis was able to come forward and increased movement of the coccyx. Patient has to wear a pad due to fecal leakage. She does not fully empty her stool and has to go frequently. Decreased s-section mobility. Weakness in the hips and abdomen. Patient will get abdominal pain with bowel movement due to build up of gas. Patient will benefit from skilled therapy to reduce pain, improve continence, improve pelvic floor strength.    Personal Factors and Comorbidities Comorbidity 3+;Sex    Comorbidities Anterior and posterior repair 2020; C-section 1990; Cholecystectomy 1997; laparoscopic gastric sleeve resection and hiatal hernia 09/16/2010; laparoscopic vaginal hysterectomy with salpingectomy 11/24/2016    Examination-Activity Limitations Continence;Toileting;Hygiene/Grooming    Examination-Participation Restrictions Community Activity;Interpersonal Relationship    Stability/Clinical Decision Making Evolving/Moderate complexity    Clinical Decision Making Low    Rehab Potential Excellent    PT Frequency 1x / week    PT Duration Other (comment)   4 months   PT Treatment/Interventions ADLs/Self Care Home Management;Biofeedback;Cryotherapy;Electrical Stimulation;Iontophoresis /ml Dexamethasone;Moist Heat;Ultrasound;Therapeutic activities;Therapeutic exercise;Neuromuscular re-education;Patient/family education;Manual techniques;Scar mobilization;Dry needling;Spinal Manipulations;Joint Manipulations    PT Next Visit Plan toileting technique; abdominal massage; manual work to the pelvic floor; work on contraction of the pelvic floor and relaxation; hip stretches    PT Home Exercise Plan Access Code: [email or web address]    Consulted and Agree with Plan of Care Patient             Patient will benefit from skilled therapeutic intervention in order to improve the following deficits and impairments:  Decreased coordination, Decreased range of motion, Increased fascial restricitons, Increased muscle spasms, Pain,  Decreased activity tolerance, Decreased strength, Decreased mobility, Decreased scar mobility  Visit Diagnosis: Muscle weakness (generalized) - Plan: PT plan of care cert/re-cert  Other lack of coordination - Plan: PT plan of care cert/re-cert  Cramp and spasm - Plan: PT plan of care cert/re-cert  Coccyx pain - Plan: PT plan of care cert/re-cert  Fecal smearing - Plan: PT plan of care cert/re-cert     Problem List Patient Active Problem List   Diagnosis Date Noted   DVT (deep venous thrombosis) (HCC) 02/21/2019   Rectocele 11/13/2016   Incomplete uterine prolapse 11/13/2016   Cystocele, midline 11/13/2016   Seasonal allergies 09/17/2014   Incontinence    Obesity (BMI 30-39.9) 12/24/2010   Hx of laparoscopic gastric banding 09/23/2010    Eulis Fosterheryl Jessicaann Overbaugh, PT 08/29/20 10:51 AM  [facility] [address], [address] [place], [place], [address] Phone: [phone]   Fax:  [phone]  Name: Warden FillersLisa F Berwanger MRN: [id number] Date of Birth: 1955/04/13

## 2020-09-05 ENCOUNTER — Other Ambulatory Visit: Payer: Self-pay

## 2020-09-05 ENCOUNTER — Encounter: Payer: Self-pay | Admitting: Physical Therapy

## 2020-09-05 ENCOUNTER — Ambulatory Visit: Payer: No Typology Code available for payment source | Admitting: Physical Therapy

## 2020-09-05 DIAGNOSIS — R278 Other lack of coordination: Secondary | ICD-10-CM

## 2020-09-05 DIAGNOSIS — R252 Cramp and spasm: Secondary | ICD-10-CM

## 2020-09-05 DIAGNOSIS — R151 Fecal smearing: Secondary | ICD-10-CM

## 2020-09-05 DIAGNOSIS — M6281 Muscle weakness (generalized): Secondary | ICD-10-CM

## 2020-09-05 DIAGNOSIS — M533 Sacrococcygeal disorders, not elsewhere classified: Secondary | ICD-10-CM

## 2020-09-05 NOTE — Therapy (Signed)
[facility] [address], [address] [place], [place], [address] Phone: [phone]   Fax:  [phone]  Physical Therapy Treatment  Patient Details  Name: Kathryn Pineda MRN: [id number] Date of Birth: 03/16/1955 Referring Provider (PT): Dr, Valentina Shaggy   Encounter Date: 09/05/2020   PT End of Session - 09/05/20 0857     Visit Number 2    Date for PT Re-Evaluation 12/30/20    Authorization Type UMR    PT Start Time 0850    PT Stop Time 0928    PT Time Calculation (min) 38 min    Activity Tolerance Patient tolerated treatment well    Behavior During Therapy [facility] for tasks assessed/performed             Past Medical History:  Diagnosis Date   Anal fissure    Arthritis    Biliary colic 5/96   ERCP w/spincterotomy   Condyloma    Deep vein thrombosis (DVT) of lower extremity (HCC)    Fatigue    loss of sleep   Generalized headaches    GERD (gastroesophageal reflux disease)    Hemorrhoid    Hiatal hernia    Migraines    Obesity    Phlebitis of leg    PONV (postoperative nausea and vomiting)    Primary osteoarthritis of both knees    Stress incontinence    Wilson's disease     Past Surgical History:  Procedure Laterality Date   ANAL FISSURECTOMY  1986   ANTERIOR AND POSTERIOR REPAIR N/A 11/24/2016   Procedure: ANTERIOR (CYSTOCELE) AND POSTERIOR REPAIR (RECTOCELE);  Surgeon: Alfredo Martinez, MD;  Location: [facility];  Service: Urology;  Laterality: N/A;   BUNIONECTOMY Bilateral 2005   CESAREAN SECTION  1990   prolapsed cord   CYSTOSCOPY N/A 11/24/2016   Procedure: CYSTOSCOPY;  Surgeon: Alfredo Martinez, MD;  Location: [facility];  Service: Urology;  Laterality: N/A;   ENDOVENOUS ABLATION SAPHENOUS VEIN W/ LASER  2008   both lower legs   LAPAROSCOPIC CHOLECYSTECTOMY  1997   LAPAROSCOPIC GASTRIC SLEEVE RESECTION WITH HIATAL HERNIA REPAIR  09/16/2010   LAPAROSCOPIC VAGINAL HYSTERECTOMY WITH SALPINGECTOMY Bilateral  11/24/2016   Procedure: LAPAROSCOPIC ASSISTED VAGINAL HYSTERECTOMY WITH SALPINGO-OOPHORECTOMY;  Surgeon: [name], MD;  Location: [facility];  Service: Gynecology;  Laterality: Bilateral;    There were no vitals filed for this visit.   Subjective Assessment - 09/05/20 0851     Subjective I felt good after the initial evaluation. Less coccyx pain. No pain since last visit. No abdominal pain since last visit.    Patient Stated Goals reduce the fecal smearing    Currently in Pain? No/denies                               [facility] Adult PT Treatment/Exercise - 09/05/20 0001       Lumbar Exercises: Stretches   Active Hamstring Stretch Left;Right;1 rep;30 seconds    Active Hamstring Stretch Limitations supine with strap    Quadruped Mid Back Stretch 1 rep    Quadruped Mid Back Stretch Limitations tried but uncomfortable for the knees    ITB Stretch Right;Left;1 rep;30 seconds    ITB Stretch Limitations supine with strap    Piriformis Stretch Right;Left;1 rep;30 seconds    Piriformis Stretch Limitations supine    Other Lumbar Stretch Exercise happy baby holding for 30 sec.    Other Lumbar Stretch Exercise quadruped  hip hinge with spinal neutral 5x but increased knee pain so stopped      Lumbar Exercises: Quadruped   Madcat/Old Horse 15 reps      Manual Therapy   Manual Therapy Joint mobilization;Soft tissue mobilization    Manual therapy comments educated patient on scar tissue  mobilization and abdominal massage    Joint Mobilization used the mulligan belt  on the left hip for distraction, lateral glide, and inferior glide grade 3    Soft tissue mobilization circular massage to the abdomen to promote perstalic motion of the intestines, manual work around the umbilicus and lower abdominal to improve tissue soreness and reduce tenderness; scar tissue mobilization to c-section scar                    PT Education - 09/05/20 0928     Education Details Access  Code: [email or web address]  ; scar tissue massage on the c-section massage    Person(s) Educated Patient    Methods Explanation;Demonstration;Verbal cues;Handout    Comprehension Returned demonstration;Verbalized understanding              PT Short Term Goals - 08/29/20 1042       PT SHORT TERM GOAL #1   Title independent with initial HEP    Time 4    Period Weeks    Status New    Target Date 09/26/20      PT SHORT TERM GOAL #2   Title understand correct toileting technique to fully empty her bowels    Time 4    Period Weeks    Status New    Target Date 09/26/20      PT SHORT TERM GOAL #3   Title instructed on abdominal massage to reduce fascial restrictions and pain    Time 4    Period Weeks    Status New    Target Date 09/26/20      PT SHORT TERM GOAL #4   Title pelvic floor strength >/= 2/5 holding for 10 seconds and fully relax after the contraction    Time 4    Period Weeks    Status New    Target Date 09/26/20               PT Long Term Goals - 08/29/20 1044       PT LONG TERM GOAL #1   Title Independent with advanced HEP for core and pelvic floor strength to reduce fecal smearing    Time 4    Period Months    Status New    Target Date 12/30/20      PT LONG TERM GOAL #2   Title pelvic floor strength is >/= 3/5 holding for 10 seconds to reduce her wearing a pad to none    Time 4    Period Months    Status New    Target Date 12/30/20      PT LONG TERM GOAL #3   Title able to fully empty her stool due to improve pelvic floor coordination with contraction and full relaxation    Time 4    Period Months    Status New    Target Date 12/30/20      PT LONG TERM GOAL #4   Title abdominal pain decreased >/= 75% due to improved mobility of stool using the abodminal massage and reduction of fascial restrictions    Time 4    Period Months    Status New  Target Date 12/30/20      PT LONG TERM GOAL #5   Title coccyx pain reduce from minimal to none due to  improved mobility of the coccyx and reduction of trigger points in the pelvic floor muscles.    Time 4    Period Months    Status New    Target Date 12/30/20      Additional Long Term Goals   Additional Long Term Goals Yes      PT LONG TERM GOAL #6   Title able to have penile penetration vaginally with pain decreased </= 0-1 due to improved mobiity of the vaginal tissue and reduction of dryness    Time 4    Period Months    Status New    Target Date 12/30/20                   Plan - 09/05/20 0857     Clinical Impression Statement Patient had increased abdominal tissue mobility and less soreness. Heard good bowel sounds after manual work. Patient has difficulty with fully emptying her bladder. Patient has not had coccyx or abdominal pain since last visit. She has difficulty with exercises on her knees due to knee pain. Patient able to stretch left hip without pain after mobilization. Patient will benefit from skilled therapy to reduce pain, improve continence, improve pelvic floor strength.    Personal Factors and Comorbidities Comorbidity 3+;Sex    Comorbidities Anterior and posterior repair 2020; C-section 1990; Cholecystectomy 1997; laparoscopic gastric sleeve resection and hiatal hernia 09/16/2010; laparoscopic vaginal hysterectomy with salpingectomy 11/24/2016    Examination-Activity Limitations Continence;Toileting;Hygiene/Grooming    Examination-Participation Restrictions Community Activity;Interpersonal Relationship    Stability/Clinical Decision Making Evolving/Moderate complexity    Rehab Potential Excellent    PT Frequency 1x / week    PT Duration Other (comment)   4 months   PT Treatment/Interventions ADLs/Self Care Home Management;Biofeedback;Cryotherapy;Electrical Stimulation;Iontophoresis 4mg /ml Dexamethasone;Moist Heat;Ultrasound;Therapeutic activities;Therapeutic exercise;Neuromuscular re-education;Patient/family education;Manual techniques;Scar mobilization;Dry  needling;Spinal Manipulations;Joint Manipulations    PT Next Visit Plan toileting technique; abdominal massage; manual work to the pelvic floor; work on contraction of the pelvic floor and relaxation    PT Home Exercise Plan Access Code: [email or web address]    Recommended Other Services MD signed initial eval    Consulted and Agree with Plan of Care Patient             Patient will benefit from skilled therapeutic intervention in order to improve the following deficits and impairments:  Decreased coordination, Decreased range of motion, Increased fascial restricitons, Increased muscle spasms, Pain, Decreased activity tolerance, Decreased strength, Decreased mobility, Decreased scar mobility  Visit Diagnosis: Muscle weakness (generalized)  Other lack of coordination  Cramp and spasm  Coccyx pain  Fecal smearing     Problem List Patient Active Problem List   Diagnosis Date Noted   DVT (deep venous thrombosis) (HCC) 02/21/2019   Rectocele 11/13/2016   Incomplete uterine prolapse 11/13/2016   Cystocele, midline 11/13/2016   Seasonal allergies 09/17/2014   Incontinence    Obesity (BMI 30-39.9) 12/24/2010   Hx of laparoscopic gastric banding 09/23/2010    11/23/2010, PT 09/05/20 9:33 AM  [facility] [address] [place], [place], [place] Phone: [phone]   Fax:  [phone]  Name: Kathryn Pineda MRN: Warden Fillers Date of Birth: February 17, 1955

## 2020-09-05 NOTE — Patient Instructions (Signed)
Access Code: [email or web address] URL: [email or web address] Date: 09/05/2020 Prepared by: Eulis Foster  Exercises Sidelying Pelvic Floor Contraction with Self-Palpation - 3 x daily - 7 x weekly - 1 sets - 5 reps - 2 sec hold Positioning on Toilet and Defecation Technique - 1 x daily - 7 x weekly - 3 sets - 10 reps Supine Hamstring Stretch with Strap - 1 x daily - 7 x weekly - 1 sets - 2 reps - 30 sec hold Supine ITB Stretch with Strap - 1 x daily - 7 x weekly - 1 sets - 2 reps - 30 sec hold Supine Pelvic Floor Stretch - 1 x daily - 7 x weekly - 1 sets - 2 reps - 30 sec hold Supine Piriformis Stretch - 1 x daily - 7 x weekly - 1 sets - 2 reps - 30 sec hold Cat Cow - 1 x daily - 7 x weekly - 3 sets - 10 reps  [facility] [address] [place], [place] [address] Phone # [phone] Fax [phone]

## 2020-09-06 ENCOUNTER — Ambulatory Visit
Admission: RE | Admit: 2020-09-06 | Discharge: 2020-09-06 | Disposition: A | Discharge status: Home / Self Care | Payer: No Typology Code available for payment source | Source: Ambulatory Visit | Attending: Obstetrics & Gynecology | Admitting: Obstetrics & Gynecology

## 2020-09-06 DIAGNOSIS — Z1231 Encounter for screening mammogram for malignant neoplasm of breast: Secondary | ICD-10-CM

## 2020-09-18 ENCOUNTER — Other Ambulatory Visit: Payer: Self-pay

## 2020-09-18 ENCOUNTER — Ambulatory Visit: Payer: No Typology Code available for payment source | Attending: Obstetrics & Gynecology | Admitting: Physical Therapy

## 2020-09-18 ENCOUNTER — Encounter: Payer: Self-pay | Admitting: Physical Therapy

## 2020-09-18 DIAGNOSIS — R252 Cramp and spasm: Secondary | ICD-10-CM | POA: Insufficient documentation

## 2020-09-18 DIAGNOSIS — M533 Sacrococcygeal disorders, not elsewhere classified: Secondary | ICD-10-CM | POA: Diagnosis present

## 2020-09-18 DIAGNOSIS — M6281 Muscle weakness (generalized): Secondary | ICD-10-CM | POA: Insufficient documentation

## 2020-09-18 DIAGNOSIS — R151 Fecal smearing: Secondary | ICD-10-CM | POA: Diagnosis present

## 2020-09-18 DIAGNOSIS — R278 Other lack of coordination: Secondary | ICD-10-CM | POA: Diagnosis present

## 2020-09-18 NOTE — Therapy (Signed)
[facility] [address], [address] [place], [place], [address] Phone: [phone]   Fax:  [phone]  Physical Therapy Treatment  Patient Details  Name: Kathryn Pineda MRN: [id number] Date of Birth: 1955/04/01 Referring Provider (PT): Dr, Valentina Shaggy   Encounter Date: 09/18/2020   PT End of Session - 09/18/20 1701     Visit Number 3    Date for PT Re-Evaluation 12/30/20    Authorization Type UMR    PT Start Time 1614    PT Stop Time 1655    PT Time Calculation (min) 41 min    Activity Tolerance Patient tolerated treatment well    Behavior During Therapy [facility] for tasks assessed/performed             Past Medical History:  Diagnosis Date   Anal fissure    Arthritis    Biliary colic 5/96   ERCP w/spincterotomy   Condyloma    Deep vein thrombosis (DVT) of lower extremity (HCC)    Fatigue    loss of sleep   Generalized headaches    GERD (gastroesophageal reflux disease)    Hemorrhoid    Hiatal hernia    Migraines    Obesity    Phlebitis of leg    PONV (postoperative nausea and vomiting)    Primary osteoarthritis of both knees    Stress incontinence    Wilson's disease     Past Surgical History:  Procedure Laterality Date   ANAL FISSURECTOMY  1986   ANTERIOR AND POSTERIOR REPAIR N/A 11/24/2016   Procedure: ANTERIOR (CYSTOCELE) AND POSTERIOR REPAIR (RECTOCELE);  Surgeon: Alfredo Martinez, MD;  Location: [facility];  Service: Urology;  Laterality: N/A;   BUNIONECTOMY Bilateral 2005   CESAREAN SECTION  1990   prolapsed cord   CYSTOSCOPY N/A 11/24/2016   Procedure: CYSTOSCOPY;  Surgeon: Alfredo Martinez, MD;  Location: [facility];  Service: Urology;  Laterality: N/A;   ENDOVENOUS ABLATION SAPHENOUS VEIN W/ LASER  2008   both lower legs   LAPAROSCOPIC CHOLECYSTECTOMY  1997   LAPAROSCOPIC GASTRIC SLEEVE RESECTION WITH HIATAL HERNIA REPAIR  09/16/2010   LAPAROSCOPIC VAGINAL HYSTERECTOMY WITH SALPINGECTOMY Bilateral  11/24/2016   Procedure: LAPAROSCOPIC ASSISTED VAGINAL HYSTERECTOMY WITH SALPINGO-OOPHORECTOMY;  Surgeon: [name], MD;  Location: [facility];  Service: Gynecology;  Laterality: Bilateral;    There were no vitals filed for this visit.   Subjective Assessment - 09/18/20 1616     Subjective I am doing well. I have been doing the exercise everyday. I have no pain but in my hip. I have not had issues with my perineum. I did not have fecal smearing since the last visit.    Patient Stated Goals reduce the fecal smearing    Currently in Pain? No/denies                               [facility] Adult PT Treatment/Exercise - 09/18/20 0001       Self-Care   Self-Care Other Self-Care Comments    Other Self-Care Comments  educated patient on how to push the rectum back into the anal canal and tightent to imporve muscle control      Therapeutic Activites    Therapeutic Activities Other Therapeutic Activities    Other Therapeutic Activities educated patient on correct toilieting technique, knees above the hips, and correct breathing      Exercises   Exercises Other Exercises  Other Exercises  sitting diagonal hip flexion isometric holding for 5 seconds 10x each side with pelvic floor contraction; standing with diagonal chop with green band and with pelvic floor contraction      Manual Therapy   Manual Therapy Soft tissue mobilization    Soft tissue mobilization scar mobilization to the c-section scar and educated patient on how she is to lift the scar up                    PT Education - 09/18/20 1659     Education Details Access Code: [email or web address]; toileting technique    Person(s) Educated Patient    Methods Explanation;Demonstration;Verbal cues;Handout    Comprehension Returned demonstration;Verbalized understanding              PT Short Term Goals - 09/18/20 1705       PT SHORT TERM GOAL #1   Title independent with initial HEP    Time 4    Period Weeks     Status Achieved      PT SHORT TERM GOAL #2   Title understand correct toileting technique to fully empty her bowels    Time 4    Period Weeks    Status Achieved      PT SHORT TERM GOAL #3   Title instructed on abdominal massage to reduce fascial restrictions and pain    Time 4    Period Weeks    Status Achieved      PT SHORT TERM GOAL #4   Title pelvic floor strength >/= 2/5 holding for 10 seconds and fully relax after the contraction    Time 4    Period Weeks    Status Achieved               PT Long Term Goals - 08/29/20 1044       PT LONG TERM GOAL #1   Title Independent with advanced HEP for core and pelvic floor strength to reduce fecal smearing    Time 4    Period Months    Status New    Target Date 12/30/20      PT LONG TERM GOAL #2   Title pelvic floor strength is >/= 3/5 holding for 10 seconds to reduce her wearing a pad to none    Time 4    Period Months    Status New    Target Date 12/30/20      PT LONG TERM GOAL #3   Title able to fully empty her stool due to improve pelvic floor coordination with contraction and full relaxation    Time 4    Period Months    Status New    Target Date 12/30/20      PT LONG TERM GOAL #4   Title abdominal pain decreased >/= 75% due to improved mobility of stool using the abodminal massage and reduction of fascial restrictions    Time 4    Period Months    Status New    Target Date 12/30/20      PT LONG TERM GOAL #5   Title coccyx pain reduce from minimal to none due to improved mobility of the coccyx and reduction of trigger points in the pelvic floor muscles.    Time 4    Period Months    Status New    Target Date 12/30/20      Additional Long Term Goals   Additional Long Term Goals Yes  PT LONG TERM GOAL #6   Title able to have penile penetration vaginally with pain decreased </= 0-1 due to improved mobiity of the vaginal tissue and reduction of dryness    Time 4    Period Months    Status New     Target Date 12/30/20                   Plan - 09/18/20 1701     Clinical Impression Statement Patient is not having fecal smearing since her last visit. Her c-section scar has improved mobility and is not as scared down. Patient is able to wait longer in the morning to urinate due to improved mobility of the c-section scar. Patient is learning how to contract her pelvic floor against gravity and working her adominals at the same time. She is able to have more type 4 bowel movements. Patient is not having coccyx pain. Patient will benefit from skilled therapy to improve continence and pelvic floor strength.    Personal Factors and Comorbidities Comorbidity 3+;Sex    Comorbidities Anterior and posterior repair 2020; C-section 1990; Cholecystectomy 1997; laparoscopic gastric sleeve resection and hiatal hernia 09/16/2010; laparoscopic vaginal hysterectomy with salpingectomy 11/24/2016    Examination-Activity Limitations Continence;Toileting;Hygiene/Grooming    Examination-Participation Restrictions Community Activity;Interpersonal Relationship    Stability/Clinical Decision Making Evolving/Moderate complexity    Rehab Potential Excellent    PT Frequency 1x / week    PT Duration Other (comment)   4 months   PT Treatment/Interventions ADLs/Self Care Home Management;Biofeedback;Cryotherapy;Electrical Stimulation;Iontophoresis 4mg /ml Dexamethasone;Moist Heat;Ultrasound;Therapeutic activities;Therapeutic exercise;Neuromuscular re-education;Patient/family education;Manual techniques;Scar mobilization;Dry needling;Spinal Manipulations;Joint Manipulations    PT Next Visit Plan progress HEP and see if she can be discharged    PT Home Exercise Plan Access Code: [email or web address]    Consulted and Agree with Plan of Care Patient             Patient will benefit from skilled therapeutic intervention in order to improve the following deficits and impairments:  Decreased coordination, Decreased range of  motion, Increased fascial restricitons, Increased muscle spasms, Pain, Decreased activity tolerance, Decreased strength, Decreased mobility, Decreased scar mobility  Visit Diagnosis: Muscle weakness (generalized)  Other lack of coordination  Cramp and spasm  Coccyx pain  Fecal smearing     Problem List Patient Active Problem List   Diagnosis Date Noted   DVT (deep venous thrombosis) (HCC) 02/21/2019   Rectocele 11/13/2016   Incomplete uterine prolapse 11/13/2016   Cystocele, midline 11/13/2016   Seasonal allergies 09/17/2014   Incontinence    Obesity (BMI 30-39.9) 12/24/2010   Hx of laparoscopic gastric banding 09/23/2010    11/23/2010, PT 09/18/20 5:07 PM  [facility] [address] [place], [place], [place] Phone: [phone]   Fax:  [phone]  Name: Kathryn Pineda MRN: Warden Fillers Date of Birth: [birth year]

## 2020-09-18 NOTE — Patient Instructions (Addendum)
Toileting Techniques for Bowel Movements    An Evacuation/Defecation Plan   Here are the 4 basic points:  Lean forward enough for your elbows to rest on your knees Support your feet on the floor or use a low stool if your feet don't touch the floor  Push out your belly as if you have swallowed a beach ball--you should feel a widening of your waist. "Belly Big, Belly Hard" Open and relax your pelvic floor muscles, rather than tightening around the anus  While you are sitting on the toilet pay attention to the following areas: Jaw and mouth position- relaxed not clenched Angle of your hips - leaning slightly forward Whether your feet touch the ground or not - should be flat and supported Arm placement - rest against your thighs Spine position - flat back Waist Breathing - exhale as you push (like blowing up a balloon or try using other sounds such as ahhhh, shhhhh, ohhhh or grrrrrrr) Kathryn Pineda - hard and tight as you push Anus (opening of the anal canal) - relaxed and open as you push Anus - Tighten and lift pulling the muscle back in after you are done or if taking a break  If you are not successful after 10-15 minutes, try again later.  Avoid negative self-talk about your toileting experience.   Read this for more details and ask your PT if you need suggestions for adjustments or limitations:  Sitting on the toilet  a) Make sure your feet are supported - flat on the floor or step stool b) Many people find it effective to lean forward or raise their knees.  Propping your feet on a step stool (Squatty Potty is a brand name) can help the muscles around the anus to relax  c) When you lean forward, place your forearms on your thighs for support  Relaxing Breathe deeply and slowly in through your nose and out through your mouth. To become aware of how to relax your muscles, contracting and releasing muscles can be helpful.  Pull your pelvic floor muscles in tightly by using the image of  holding back gas, or closing around the anus (visualize making a circle smaller) and lifting the anus up and in.  Then release the muscles and your anus should drop down and feel open. Repeat 5 times ending with the feeling of relaxation. Keep your pelvic floor muscles relaxed; let your belly bulge out. The digestive tract starts at the mouth and ends at the anal opening, so be sure to relax both ends of the tube.  Place your tongue on the roof of your mouth with your teeth separated.  This helps relax your mouth and will help to relax the anus at the same time.  Emptying (defecation) a) Keep your pelvic floor and sphincter relaxed, then bulge your anal muscles.  Make the anal opening wide.  b) Stick your belly out as if you have swallowed a beach ball. c) Make your belly wall hard using your belly muscles while continuing to breathe. Doing this makes it easier to open your anus. d) Breath out and give a grunt (or try using other sounds such as ahhhh, shhhhh, ohhhh or grrrrrrr). e)  Can also try to act as if you are blowing up a balloon as you push  4) Finishing a) As you finish your bowel movement, pull the pelvic floor muscles up and in.  This will leave your anus in the proper place rather than remaining pushed out and down. If  you leave your anus pushed out and down, it will start to feel as though that is normal and give you incorrect signals about needing to have a bowel movement. Access Code: [email or web address] URL: [email or web address] Date: 09/18/2020 Prepared by: Kathryn Pineda  Exercises Positioning on Toilet and Defecation Technique - 1 x daily - 7 x weekly - 3 sets - 10 reps Supine Hamstring Stretch with Strap - 1 x daily - 7 x weekly - 1 sets - 2 reps - 30 sec hold Supine ITB Stretch with Strap - 1 x daily - 7 x weekly - 1 sets - 2 reps - 30 sec hold Supine Pelvic Floor Stretch - 1 x daily - 7 x weekly - 1 sets - 2 reps - 30 sec hold Supine Piriformis Stretch - 1 x daily - 7 x  weekly - 1 sets - 2 reps - 30 sec hold Cat Cow - 1 x daily - 7 x weekly - 3 sets - 10 reps Seated Pelvic Floor Contraction - 3 x daily - 7 x weekly - 1 sets - 5 reps - 5 sec hold Hooklying Isometric Hip Flexion with Opposite Arm - 1 x daily - 7 x weekly - 1 sets - 10 reps Standing Diagonal Chop - 1 x daily - 7 x weekly - 1 sets - 10 reps  [facility] [address], [address] [place], [place] [address] Phone # [phone] Fax [phone]

## 2020-09-23 ENCOUNTER — Encounter: Payer: Self-pay | Admitting: Physical Therapy

## 2020-09-23 ENCOUNTER — Other Ambulatory Visit: Payer: Self-pay

## 2020-09-23 ENCOUNTER — Ambulatory Visit: Payer: No Typology Code available for payment source | Admitting: Physical Therapy

## 2020-09-23 DIAGNOSIS — M533 Sacrococcygeal disorders, not elsewhere classified: Secondary | ICD-10-CM

## 2020-09-23 DIAGNOSIS — R252 Cramp and spasm: Secondary | ICD-10-CM

## 2020-09-23 DIAGNOSIS — R151 Fecal smearing: Secondary | ICD-10-CM

## 2020-09-23 DIAGNOSIS — M6281 Muscle weakness (generalized): Secondary | ICD-10-CM | POA: Diagnosis not present

## 2020-09-23 DIAGNOSIS — R278 Other lack of coordination: Secondary | ICD-10-CM

## 2020-09-23 NOTE — Therapy (Signed)
[facility] [address], [address] [place], [place], [address] Phone: [phone]   Fax:  [phone]  Physical Therapy Treatment  Patient Details  Name: Kathryn Pineda MRN: [id number] Date of Birth: 1955-03-18 Referring Provider (PT): Dr, Valentina Shaggy   Encounter Date: 09/23/2020   PT End of Session - 09/23/20 1229     Visit Number 4    Date for PT Re-Evaluation 12/30/20    Authorization Type UMR    PT Start Time 1145    PT Stop Time 1225    PT Time Calculation (min) 40 min    Activity Tolerance Patient tolerated treatment well    Behavior During Therapy [facility] for tasks assessed/performed             Past Medical History:  Diagnosis Date   Anal fissure    Arthritis    Biliary colic 5/96   ERCP w/spincterotomy   Condyloma    Deep vein thrombosis (DVT) of lower extremity (HCC)    Fatigue    loss of sleep   Generalized headaches    GERD (gastroesophageal reflux disease)    Hemorrhoid    Hiatal hernia    Migraines    Obesity    Phlebitis of leg    PONV (postoperative nausea and vomiting)    Primary osteoarthritis of both knees    Stress incontinence    Wilson's disease     Past Surgical History:  Procedure Laterality Date   ANAL FISSURECTOMY  1986   ANTERIOR AND POSTERIOR REPAIR N/A 11/24/2016   Procedure: ANTERIOR (CYSTOCELE) AND POSTERIOR REPAIR (RECTOCELE);  Surgeon: Alfredo Martinez, MD;  Location: [facility];  Service: Urology;  Laterality: N/A;   BUNIONECTOMY Bilateral 2005   CESAREAN SECTION  1990   prolapsed cord   CYSTOSCOPY N/A 11/24/2016   Procedure: CYSTOSCOPY;  Surgeon: Alfredo Martinez, MD;  Location: [facility];  Service: Urology;  Laterality: N/A;   ENDOVENOUS ABLATION SAPHENOUS VEIN W/ LASER  2008   both lower legs   LAPAROSCOPIC CHOLECYSTECTOMY  1997   LAPAROSCOPIC GASTRIC SLEEVE RESECTION WITH HIATAL HERNIA REPAIR  09/16/2010   LAPAROSCOPIC VAGINAL HYSTERECTOMY WITH SALPINGECTOMY Bilateral  11/24/2016   Procedure: LAPAROSCOPIC ASSISTED VAGINAL HYSTERECTOMY WITH SALPINGO-OOPHORECTOMY;  Surgeon: [name], MD;  Location: [facility];  Service: Gynecology;  Laterality: Bilateral;    There were no vitals filed for this visit.   Subjective Assessment - 09/23/20 1151     Subjective My left hip is feeling better. I would like to do a review of my exercises. Everything is the same today. No smearing since last visit. I was able to get to the commode since last visit. This weekend I had alot of gas and no pain. I had a little soreness in the tailbone for one day.    Patient Stated Goals reduce the fecal smearing    Currently in Pain? No/denies                               [name] Adult PT Treatment/Exercise - 09/23/20 0001       Self-Care   Self-Care Other Self-Care Comments    Other Self-Care Comments  educated patient on how to feel for a pelvic floor contraction with squeeze and lift at home      Lumbar Exercises: Stretches   Piriformis Stretch Right;Left;1 rep;30 seconds    Piriformis Stretch Limitations pigeon pose      Lumbar  Exercises: Aerobic   Stationary Bike level 3 for 5 minutes while assessing patient      Lumbar Exercises: Standing   Other Standing Lumbar Exercises standing diagonal with green band and pelvic floor contraction 10x each side      Lumbar Exercises: Seated   Other Seated Lumbar Exercises pelvic floor contraction holding for 5 seconds 5 times; hip flexion diagonal isometric with pelvic floor contraction holding 5 sec and 5 times followed by quick contractions                    PT Education - 09/23/20 1228     Education Details Access Code: [email or web address]    Person(s) Educated Patient    Methods Explanation;Demonstration;Verbal cues;Handout    Comprehension Verbalized understanding;Returned demonstration              PT Short Term Goals - 09/18/20 1705       PT SHORT TERM GOAL #1   Title independent with initial  HEP    Time 4    Period Weeks    Status Achieved      PT SHORT TERM GOAL #2   Title understand correct toileting technique to fully empty her bowels    Time 4    Period Weeks    Status Achieved      PT SHORT TERM GOAL #3   Title instructed on abdominal massage to reduce fascial restrictions and pain    Time 4    Period Weeks    Status Achieved      PT SHORT TERM GOAL #4   Title pelvic floor strength >/= 2/5 holding for 10 seconds and fully relax after the contraction    Time 4    Period Weeks    Status Achieved               PT Long Term Goals - 09/23/20 1231       PT LONG TERM GOAL #1   Title Independent with advanced HEP for core and pelvic floor strength to reduce fecal smearing    Time 4    Period Months    Status On-going      PT LONG TERM GOAL #2   Title pelvic floor strength is >/= 3/5 holding for 10 seconds to reduce her wearing a pad to none    Time 4    Period Months    Status On-going      PT LONG TERM GOAL #3   Title able to fully empty her stool due to improve pelvic floor coordination with contraction and full relaxation    Time 4    Period Months    Status On-going      PT LONG TERM GOAL #4   Title abdominal pain decreased >/= 75% due to improved mobility of stool using the abodminal massage and reduction of fascial restrictions    Time 4    Period Months    Status On-going      PT LONG TERM GOAL #5   Title coccyx pain reduce from minimal to none due to improved mobility of the coccyx and reduction of trigger points in the pelvic floor muscles.    Time 4    Period Months    Status On-going      PT LONG TERM GOAL #6   Title able to have penile penetration vaginally with pain decreased </= 0-1 due to improved mobiity of the vaginal tissue and reduction of dryness    Time  4    Period Months    Status On-going                   Plan - 09/23/20 1229     Clinical Impression Statement Patient has not had fecal smearing since  last visit. She is able to get to the commode without leaking. Patient had coccyx pain 1 time since saw her. She will benefit from skilled therapy to improve continence and pelvic floor strength.    Personal Factors and Comorbidities Comorbidity 3+;Sex    Comorbidities Anterior and posterior repair 2020; C-section 1990; Cholecystectomy 1997; laparoscopic gastric sleeve resection and hiatal hernia 09/16/2010; laparoscopic vaginal hysterectomy with salpingectomy 11/24/2016    Examination-Activity Limitations Continence;Toileting;Hygiene/Grooming    Examination-Participation Restrictions Community Activity;Interpersonal Relationship    Stability/Clinical Decision Making Evolving/Moderate complexity    Rehab Potential Excellent    PT Frequency 1x / week    PT Duration Other (comment)   4 months   PT Treatment/Interventions ADLs/Self Care Home Management;Biofeedback;Cryotherapy;Electrical Stimulation;Iontophoresis 4mg /ml Dexamethasone;Moist Heat;Ultrasound;Therapeutic activities;Therapeutic exercise;Neuromuscular re-education;Patient/family education;Manual techniques;Scar mobilization;Dry needling;Spinal Manipulations;Joint Manipulations    PT Next Visit Plan progress HEP and see if she can be discharged    PT Home Exercise Plan Access Code: [email or web address]    Consulted and Agree with Plan of Care Patient             Patient will benefit from skilled therapeutic intervention in order to improve the following deficits and impairments:  Decreased coordination, Decreased range of motion, Increased fascial restricitons, Increased muscle spasms, Pain, Decreased activity tolerance, Decreased strength, Decreased mobility, Decreased scar mobility  Visit Diagnosis: Muscle weakness (generalized)  Other lack of coordination  Cramp and spasm  Coccyx pain  Fecal smearing     Problem List Patient Active Problem List   Diagnosis Date Noted   DVT (deep venous thrombosis) (HCC) 02/21/2019   Rectocele  11/13/2016   Incomplete uterine prolapse 11/13/2016   Cystocele, midline 11/13/2016   Seasonal allergies 09/17/2014   Incontinence    Obesity (BMI 30-39.9) 12/24/2010   Hx of laparoscopic gastric banding 09/23/2010    11/23/2010, PT 09/23/20 12:33 PM  [facility] [address] [place], [place], [place] Phone: [phone]   Fax:  [phone]  Name: HAILLEY BYERS MRN: Warden Fillers Date of Birth: 07-28-1955

## 2020-09-23 NOTE — Patient Instructions (Signed)
Access Code: [email or web address] URL: [email or web address] Date: 09/23/2020 Prepared by: Eulis Foster  Exercises Positioning on Toilet and Defecation Technique - 1 x daily - 7 x weekly - 3 sets - 10 reps Supine Hamstring Stretch with Strap - 1 x daily - 7 x weekly - 1 sets - 2 reps - 30 sec hold Supine ITB Stretch with Strap - 1 x daily - 7 x weekly - 1 sets - 2 reps - 30 sec hold Supine Pelvic Floor Stretch - 1 x daily - 7 x weekly - 1 sets - 2 reps - 30 sec hold Supine Piriformis Stretch - 1 x daily - 7 x weekly - 1 sets - 2 reps - 30 sec hold Pigeon Pose - 1 x daily - 7 x weekly - 1 sets - 1 reps - 15 sec hold Cat Cow - 1 x daily - 7 x weekly - 3 sets - 10 reps Seated Pelvic Floor Contraction - 3 x daily - 7 x weekly - 1 sets - 5 reps - 5 sec hold Hooklying Isometric Hip Flexion with Opposite Arm - 1 x daily - 4 x weekly - 1 sets - 10 reps Standing Diagonal Chop - 1 x daily - 7 x weekly - 1 sets - 10 reps Isometric Gluteus Medius at Wall - 1 x daily - 7 x weekly - 2 sets - 5 reps - 5 sec hold [facility] [address], [address] [place], [place] [address] Phone # [phone] Fax [phone]

## 2020-10-02 ENCOUNTER — Ambulatory Visit: Payer: No Typology Code available for payment source | Admitting: Physical Therapy

## 2020-10-02 ENCOUNTER — Other Ambulatory Visit: Payer: Self-pay

## 2020-10-02 ENCOUNTER — Encounter: Payer: Self-pay | Admitting: Physical Therapy

## 2020-10-02 DIAGNOSIS — M533 Sacrococcygeal disorders, not elsewhere classified: Secondary | ICD-10-CM

## 2020-10-02 DIAGNOSIS — R278 Other lack of coordination: Secondary | ICD-10-CM

## 2020-10-02 DIAGNOSIS — M6281 Muscle weakness (generalized): Secondary | ICD-10-CM

## 2020-10-02 DIAGNOSIS — R252 Cramp and spasm: Secondary | ICD-10-CM

## 2020-10-02 DIAGNOSIS — R151 Fecal smearing: Secondary | ICD-10-CM

## 2020-10-02 NOTE — Therapy (Signed)
[facility] [address], [address] [place], [place], [address] Phone: [phone]   Fax:  [phone]  Physical Therapy Treatment  Patient Details  Name: Kathryn Pineda MRN: [id number] Date of Birth: [birth year] Referring Provider (PT): Dr, Valentina Shaggy   Encounter Date: 10/02/2020   PT End of Session - 10/02/20 1708     Visit Number 5    Date for PT Re-Evaluation 12/30/20    Authorization Type UMR    PT Start Time 1615    PT Stop Time 1655    PT Time Calculation (min) 40 min    Activity Tolerance Patient tolerated treatment well    Behavior During Therapy [facility] for tasks assessed/performed             Past Medical History:  Diagnosis Date   Anal fissure    Arthritis    Biliary colic 5/96   ERCP w/spincterotomy   Condyloma    Deep vein thrombosis (DVT) of lower extremity (HCC)    Fatigue    loss of sleep   Generalized headaches    GERD (gastroesophageal reflux disease)    Hemorrhoid    Hiatal hernia    Migraines    Obesity    Phlebitis of leg    PONV (postoperative nausea and vomiting)    Primary osteoarthritis of both knees    Stress incontinence    Wilson's disease     Past Surgical History:  Procedure Laterality Date   ANAL FISSURECTOMY  1986   ANTERIOR AND POSTERIOR REPAIR N/A 11/24/2016   Procedure: ANTERIOR (CYSTOCELE) AND POSTERIOR REPAIR (RECTOCELE);  Surgeon: Alfredo Martinez, MD;  Location: [facility];  Service: Urology;  Laterality: N/A;   BUNIONECTOMY Bilateral 2005   CESAREAN SECTION  1990   prolapsed cord   CYSTOSCOPY N/A 11/24/2016   Procedure: CYSTOSCOPY;  Surgeon: Alfredo Martinez, MD;  Location: [facility];  Service: Urology;  Laterality: N/A;   ENDOVENOUS ABLATION SAPHENOUS VEIN W/ LASER  2008   both lower legs   LAPAROSCOPIC CHOLECYSTECTOMY  1997   LAPAROSCOPIC GASTRIC SLEEVE RESECTION WITH HIATAL HERNIA REPAIR  09/16/2010   LAPAROSCOPIC VAGINAL HYSTERECTOMY WITH SALPINGECTOMY Bilateral  11/24/2016   Procedure: LAPAROSCOPIC ASSISTED VAGINAL HYSTERECTOMY WITH SALPINGO-OOPHORECTOMY;  Surgeon: [name], MD;  Location: [facility];  Service: Gynecology;  Laterality: Bilateral;    There were no vitals filed for this visit.   Subjective Assessment - 10/02/20 1621     Subjective I have had no fecal smearing. Slight coccyx pain. I have been exercising more. I am sleeping on my back more so I do not hurt my hips. I use my metamucil nightly. Sometimes I go to the bathroom 4 times. I feel the anus close well. I use the pad just in case. I am not wiping as much.    Patient Stated Goals reduce the fecal smearing    Currently in Pain? No/denies                            Pelvic Floor Special Questions - 10/02/20 0001     Pelvic Floor Internal Exam Patient confirms identification and approves PT to assess pelvic floor and treatment    Exam Type Vaginal    Palpation tightness along the introitus. When the therapist finger is all the way into the vaginal canal there is a band along the posterior wall    Strength weak squeeze, no lift  [facility] Adult PT Treatment/Exercise - 10/02/20 0001       Self-Care   Self-Care Other Self-Care Comments    Other Self-Care Comments  discussed with patient on products to use for the vulvar area to help with thinning of the vulvar skin and reduce pain with intercourse; discussed with patient on the Ohnut to help with limit deeper penetration of the penis and improve comfort      Manual Therapy   Manual Therapy Internal Pelvic Floor    Internal Pelvic Floor manual work to the introitus, perineal body, levator ani  with elongation of the tissue and monitoring for pain.                    PT Education - 10/02/20 1705     Education Details education on ohnut and creams to use vaginally for the thin vulvar skin and reduce pain with intercourse    Person(s) Educated Patient    Methods Explanation;Other  (comment)   she took pictures with her phone   Comprehension Verbalized understanding              PT Short Term Goals - 09/18/20 1705       PT SHORT TERM GOAL #1   Title independent with initial HEP    Time 4    Period Weeks    Status Achieved      PT SHORT TERM GOAL #2   Title understand correct toileting technique to fully empty her bowels    Time 4    Period Weeks    Status Achieved      PT SHORT TERM GOAL #3   Title instructed on abdominal massage to reduce fascial restrictions and pain    Time 4    Period Weeks    Status Achieved      PT SHORT TERM GOAL #4   Title pelvic floor strength >/= 2/5 holding for 10 seconds and fully relax after the contraction    Time 4    Period Weeks    Status Achieved               PT Long Term Goals - 10/02/20 1625       PT LONG TERM GOAL #1   Title Independent with advanced HEP for core and pelvic floor strength to reduce fecal smearing    Time 4    Period Months    Status Achieved      PT LONG TERM GOAL #2   Title pelvic floor strength is >/= 3/5 holding for 10 seconds to reduce her wearing a pad to none    Time 4    Period Months      PT LONG TERM GOAL #3   Title able to fully empty her stool due to improve pelvic floor coordination with contraction and full relaxation    Time 4    Period Months    Status Achieved      PT LONG TERM GOAL #4   Title abdominal pain decreased >/= 75% due to improved mobility of stool using the abodminal massage and reduction of fascial restrictions    Time 4    Period Months    Status Achieved      PT LONG TERM GOAL #5   Title coccyx pain reduce from minimal to none due to improved mobility of the coccyx and reduction of trigger points in the pelvic floor muscles.    Time 4    Period Months  Status Achieved      PT LONG TERM GOAL #6   Title able to have penile penetration vaginally with pain decreased </= 0-1 due to improved mobiity of the vaginal tissue and reduction of  dryness    Time 4    Period Months    Status On-going                   Plan - 10/02/20 1709     Clinical Impression Statement Patient abdominal pain is more related to gluten. She is not having the diarrhea with the abdominal pain. Patient reports the massage helps with the abdominal pain. Patient pelvic floor strength is 2/5. She still has some pain with intercourse. She was educated on Ohnut to reduce the depth of penetration of the penis. She has a band that lays on the posterior wall of the vagina 4 inches inward that is tender. Patient is able to have a full stool but may have 4 in one day. Patient is taking the Miralax daily. She has not had the fecal smearing since last visit. Patient will benefit from skilled therapy to reduce pain, improve strength and coordination.    Personal Factors and Comorbidities Comorbidity 3+;Sex    Comorbidities Anterior and posterior repair 2020; C-section 1990; Cholecystectomy 1997; laparoscopic gastric sleeve resection and hiatal hernia 09/16/2010; laparoscopic vaginal hysterectomy with salpingectomy 11/24/2016    Examination-Activity Limitations Continence;Toileting;Hygiene/Grooming    Examination-Participation Restrictions Community Activity;Interpersonal Relationship    Stability/Clinical Decision Making Evolving/Moderate complexity    Rehab Potential Excellent    PT Frequency 1x / week    PT Duration Other (comment)   4 months   PT Treatment/Interventions ADLs/Self Care Home Management;Biofeedback;Cryotherapy;Electrical Stimulation;Iontophoresis 4mg /ml Dexamethasone;Moist Heat;Ultrasound;Therapeutic activities;Therapeutic exercise;Neuromuscular re-education;Patient/family education;Manual techniques;Scar mobilization;Dry needling;Spinal Manipulations;Joint Manipulations    PT Next Visit Plan work with manual work to improve tissue of the introitus, work on pelvic floor strength    PT Home Exercise Plan Access Code: [email or web address]    Consulted and  Agree with Plan of Care Patient             Patient will benefit from skilled therapeutic intervention in order to improve the following deficits and impairments:  Decreased coordination, Decreased range of motion, Increased fascial restricitons, Increased muscle spasms, Pain, Decreased activity tolerance, Decreased strength, Decreased mobility, Decreased scar mobility  Visit Diagnosis: Muscle weakness (generalized)  Other lack of coordination  Cramp and spasm  Coccyx pain  Fecal smearing     Problem List Patient Active Problem List   Diagnosis Date Noted   DVT (deep venous thrombosis) (HCC) 02/21/2019   Rectocele 11/13/2016   Incomplete uterine prolapse 11/13/2016   Cystocele, midline 11/13/2016   Seasonal allergies 09/17/2014   Incontinence    Obesity (BMI 30-39.9) 12/24/2010   Hx of laparoscopic gastric banding 09/23/2010    11/23/2010, PT 10/02/20 5:14 PM  [facility] 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

## 2020-10-07 ENCOUNTER — Other Ambulatory Visit (HOSPITAL_BASED_OUTPATIENT_CLINIC_OR_DEPARTMENT_OTHER): Payer: Self-pay

## 2020-10-07 MED ORDER — SHINGRIX 50 MCG/0.5ML IM SUSR
INTRAMUSCULAR | 1 refills | Status: DC
  Filled 2020-10-07: qty 1, 30d supply, fill #0

## 2020-10-07 MED ORDER — BOOSTRIX 5-2.5-18.5 LF-MCG/0.5 IM SUSY
PREFILLED_SYRINGE | INTRAMUSCULAR | 0 refills | Status: DC
  Filled 2020-10-07: qty 0.5, 30d supply, fill #0

## 2020-10-17 ENCOUNTER — Encounter: Payer: No Typology Code available for payment source | Attending: Obstetrics & Gynecology | Admitting: Physical Therapy

## 2020-10-17 ENCOUNTER — Encounter: Payer: Self-pay | Admitting: Physical Therapy

## 2020-10-17 ENCOUNTER — Other Ambulatory Visit: Payer: Self-pay

## 2020-10-17 DIAGNOSIS — M533 Sacrococcygeal disorders, not elsewhere classified: Secondary | ICD-10-CM | POA: Insufficient documentation

## 2020-10-17 DIAGNOSIS — M6281 Muscle weakness (generalized): Secondary | ICD-10-CM | POA: Diagnosis present

## 2020-10-17 DIAGNOSIS — R252 Cramp and spasm: Secondary | ICD-10-CM | POA: Diagnosis present

## 2020-10-17 DIAGNOSIS — R151 Fecal smearing: Secondary | ICD-10-CM | POA: Diagnosis present

## 2020-10-17 DIAGNOSIS — R278 Other lack of coordination: Secondary | ICD-10-CM | POA: Diagnosis present

## 2020-10-17 NOTE — Therapy (Signed)
[facility] [address] [place], [place], [address] Phone: [phone]   Fax:  [phone]  Physical Therapy Treatment  Patient Details  Name: Kathryn Pineda MRN: [id number] Date of Birth: 04-Jul-1955 Referring Provider (PT): Dr, Valentina Shaggy   Encounter Date: 10/17/2020   PT End of Session - 10/17/20 1355     Visit Number 6    Date for PT Re-Evaluation 12/30/20    Authorization Type UMR    PT Start Time 1310    PT Stop Time 1349    PT Time Calculation (min) 39 min    Activity Tolerance Patient tolerated treatment well    Behavior During Therapy [facility] for tasks assessed/performed             Past Medical History:  Diagnosis Date   Anal fissure    Arthritis    Biliary colic 5/96   ERCP w/spincterotomy   Condyloma    Deep vein thrombosis (DVT) of lower extremity (HCC)    Fatigue    loss of sleep   Generalized headaches    GERD (gastroesophageal reflux disease)    Hemorrhoid    Hiatal hernia    Migraines    Obesity    Phlebitis of leg    PONV (postoperative nausea and vomiting)    Primary osteoarthritis of both knees    Stress incontinence    Wilson's disease     Past Surgical History:  Procedure Laterality Date   ANAL FISSURECTOMY  1986   ANTERIOR AND POSTERIOR REPAIR N/A 11/24/2016   Procedure: ANTERIOR (CYSTOCELE) AND POSTERIOR REPAIR (RECTOCELE);  Surgeon: Alfredo Martinez, MD;  Location: [facility];  Service: Urology;  Laterality: N/A;   BUNIONECTOMY Bilateral 2005   CESAREAN SECTION  1990   prolapsed cord   CYSTOSCOPY N/A 11/24/2016   Procedure: CYSTOSCOPY;  Surgeon: Alfredo Martinez, MD;  Location: [facility];  Service: Urology;  Laterality: N/A;   ENDOVENOUS ABLATION SAPHENOUS VEIN W/ LASER  2008   both lower legs   LAPAROSCOPIC CHOLECYSTECTOMY  1997   LAPAROSCOPIC GASTRIC SLEEVE RESECTION WITH HIATAL HERNIA REPAIR  09/16/2010   LAPAROSCOPIC VAGINAL HYSTERECTOMY WITH SALPINGECTOMY Bilateral  11/24/2016   Procedure: LAPAROSCOPIC ASSISTED VAGINAL HYSTERECTOMY WITH SALPINGO-OOPHORECTOMY;  Surgeon: [name], MD;  Location: [facility];  Service: Gynecology;  Laterality: Bilateral;    There were no vitals filed for this visit.   Subjective Assessment - 10/17/20 1315     Subjective I have been doing really well. Just had one episode with leakage. I have gone without a pad several times. I am doing more physical exercise. I have a colonoscopy on 9/15. Yesterday I did not wear a pad while working out in the yard. No urinary leakage. Patient had initial discomfort with penile penetration.    Patient Stated Goals reduce the fecal smearing    Currently in Pain? No/denies                               [facility] Adult PT Treatment/Exercise - 10/17/20 0001       Lumbar Exercises: Stretches   Piriformis Stretch Right;Left;1 rep;30 seconds    Piriformis Stretch Limitations pigeon pose      Lumbar Exercises: Supine   Bent Knee Raise 10 reps;1 second    Bent Knee Raise Limitations anal contraction and lower abdomoinal contraction    Single Leg Bridge 10 reps;1 second   5 times each leg  Bridge with [employer] Limitations anal squeeze    Isometric Hip Flexion 10 reps;5 seconds    Isometric Hip Flexion Limitations pusing on the inner thigh with anal contraction      Manual Therapy   Manual Therapy Soft tissue mobilization    Soft tissue mobilization using the addaday to the left hip, gluteus medius and ITB in right sidely                    PT Education - 10/17/20 1354     Education Details Access Code: [email or web address]    Person(s) Educated Patient    Methods Explanation;Demonstration;Verbal cues;Handout    Comprehension Returned demonstration;Verbalized understanding              PT Short Term Goals - 09/18/20 1705       PT SHORT TERM GOAL #1   Title independent with initial HEP    Time 4    Period Weeks    Status Achieved      PT SHORT TERM  GOAL #2   Title understand correct toileting technique to fully empty her bowels    Time 4    Period Weeks    Status Achieved      PT SHORT TERM GOAL #3   Title instructed on abdominal massage to reduce fascial restrictions and pain    Time 4    Period Weeks    Status Achieved      PT SHORT TERM GOAL #4   Title pelvic floor strength >/= 2/5 holding for 10 seconds and fully relax after the contraction    Time 4    Period Weeks    Status Achieved               PT Long Term Goals - 10/17/20 1359       PT LONG TERM GOAL #5   Title coccyx pain reduce from minimal to none due to improved mobility of the coccyx and reduction of trigger points in the pelvic floor muscles.    Time 4    Period Months    Status Achieved      PT LONG TERM GOAL #6   Title able to have penile penetration vaginally with pain decreased </= 0-1 due to improved mobiity of the vaginal tissue and reduction of dryness    Time 4    Period Months    Status Achieved                   Plan - 10/17/20 1355     Clinical Impression Statement Patient reports she only had one time with fecal leakage and yesterday did not wear a pad. She is not having urinary leakage. Patient has learned advanced HEP. She will lose the lower abdominal engagement when she is bringing her leg down with supine march exercise. Patient has reduced pain with penile penetration vaginally and it happens with initial penetraction. Patient will be seen in 1 month to see how she is doing and to work on her exercises.    Personal Factors and Comorbidities Comorbidity 3+;Sex    Comorbidities Anterior and posterior repair 2020; C-section 1990; Cholecystectomy 1997; laparoscopic gastric sleeve resection and hiatal hernia 09/16/2010; laparoscopic vaginal hysterectomy with salpingectomy 11/24/2016    Examination-Activity Limitations Continence;Toileting;Hygiene/Grooming    Examination-Participation Restrictions Community  Activity;Interpersonal Relationship    Stability/Clinical Decision Making Evolving/Moderate complexity    Rehab Potential Excellent    PT Frequency 1x / week    PT Duration  Other (comment)   4 months   PT Treatment/Interventions ADLs/Self Care Home Management;Biofeedback;Cryotherapy;Electrical Stimulation;Iontophoresis 4mg /ml Dexamethasone;Moist Heat;Ultrasound;Therapeutic activities;Therapeutic exercise;Neuromuscular re-education;Patient/family education;Manual techniques;Scar mobilization;Dry needling;Spinal Manipulations;Joint Manipulations    PT Next Visit Plan update HEP and see if she is ready for discharge    PT Home Exercise Plan Access Code: [email or web address]    Consulted and Agree with Plan of Care Patient             Patient will benefit from skilled therapeutic intervention in order to improve the following deficits and impairments:  Decreased coordination, Decreased range of motion, Increased fascial restricitons, Increased muscle spasms, Pain, Decreased activity tolerance, Decreased strength, Decreased mobility, Decreased scar mobility  Visit Diagnosis: Muscle weakness (generalized)  Other lack of coordination  Cramp and spasm  Coccyx pain  Fecal smearing     Problem List Patient Active Problem List   Diagnosis Date Noted   DVT (deep venous thrombosis) (HCC) 02/21/2019   Rectocele 11/13/2016   Incomplete uterine prolapse 11/13/2016   Cystocele, midline 11/13/2016   Seasonal allergies 09/17/2014   Incontinence    Obesity (BMI 30-39.9) 12/24/2010   Hx of laparoscopic gastric banding 09/23/2010    11/23/2010, PT 10/17/20 2:01 PM   [facility] [address], [address] [place], [place], [place] Phone: [phone]   Fax:  [phone]  Name: Kathryn Pineda MRN: Warden Fillers Date of Birth: December 21, 1955

## 2020-10-17 NOTE — Patient Instructions (Signed)
Access Code: [email or web address] URL: [email or web address] Date: 10/17/2020 Prepared by: Eulis Foster  Exercises Positioning on Toilet and Defecation Technique - 1 x daily - 7 x weekly - 3 sets - 10 reps Supine Hamstring Stretch with Strap - 1 x daily - 7 x weekly - 1 sets - 2 reps - 30 sec hold Supine ITB Stretch with Strap - 1 x daily - 7 x weekly - 1 sets - 2 reps - 30 sec hold Supine Pelvic Floor Stretch - 1 x daily - 7 x weekly - 1 sets - 2 reps - 30 sec hold Supine Piriformis Stretch - 1 x daily - 7 x weekly - 1 sets - 2 reps - 30 sec hold Pigeon Pose - 1 x daily - 7 x weekly - 1 sets - 1 reps - 15 sec hold Cat Cow - 1 x daily - 7 x weekly - 3 sets - 10 reps Seated Pelvic Floor Contraction - 3 x daily - 7 x weekly - 1 sets - 5 reps - 5 sec hold Standing Diagonal Chop - 1 x daily - 7 x weekly - 1 sets - 10 reps Isometric Gluteus Medius at Wall - 1 x daily - 7 x weekly - 2 sets - 5 reps - 5 sec hold Supine March - 1 x daily - 3 x weekly - 1 sets - 10 reps Marching Bridge - 1 x daily - 3 x weekly - 2 sets - 5 reps Wall Squat with Resistance Loop - 1 x daily - 7 x weekly - 1 sets - 5 reps - 10 sec hold Eulis Foster, PT [facility] [address] [place], [place] [address] W: [phone] [email or web address]

## 2020-10-24 ENCOUNTER — Encounter: Payer: No Typology Code available for payment source | Admitting: Physical Therapy

## 2020-10-30 ENCOUNTER — Other Ambulatory Visit (HOSPITAL_BASED_OUTPATIENT_CLINIC_OR_DEPARTMENT_OTHER): Payer: Self-pay

## 2020-10-30 MED ORDER — PEG-3350/ELECTROLYTES 236 G PO SOLR
ORAL | 0 refills | Status: DC
  Filled 2020-10-30: qty 4000, 7d supply, fill #0

## 2020-11-08 ENCOUNTER — Encounter (HOSPITAL_BASED_OUTPATIENT_CLINIC_OR_DEPARTMENT_OTHER): Payer: Self-pay | Admitting: *Deleted

## 2020-12-19 ENCOUNTER — Encounter: Payer: No Typology Code available for payment source | Admitting: Physical Therapy

## 2020-12-23 ENCOUNTER — Ambulatory Visit: Payer: No Typology Code available for payment source | Attending: Internal Medicine

## 2020-12-23 DIAGNOSIS — Z23 Encounter for immunization: Secondary | ICD-10-CM

## 2020-12-23 NOTE — Progress Notes (Addendum)
   Covid-19 Vaccination Clinic  Name:  Kathryn Pineda    MRN: [id number] DOB: August 07, 1955  12/23/2020  [name] was observed post Covid-19 immunization for 30 minutes based on pre-vaccination screening without incident. She was provided with Vaccine Information Sheet and instruction to access the V-Safe system.   [name] was instructed to call 911 with any severe reactions post vaccine: Difficulty breathing  Swelling of face and throat  A fast heartbeat  A bad rash all over body  Dizziness and weakness   Immunizations Administered     Name Date Dose VIS Date Route   Pfizer Covid-19 Vaccine Bivalent Booster 12/23/2020 10:12 AM 0.3 mL 10/16/2020 Intramuscular   Manufacturer: [employer], [employer]   Lot: [id number]   NDC: [phone]

## 2020-12-31 NOTE — Progress Notes (Addendum)
[facility] [address], [address] [place], [place] [address] [phone]  Date:  01/01/2021   Name:  Kathryn Pineda   DOB:  [birth year]   MRN:  [id number]  PCP:  Kathryn Cables, MD    Chief Complaint: follow up after colonoscopy (Pt says she had her colonoscopy on 10/31/20. She would like to discuss repairs to the surgeries that GYN had performed that she thinks her colonoscopy might have effected. )   History of Present Illness:  Kathryn Pineda is a 65 y.o. very pleasant female patient who presents with the following:  Pt seen today for follow-up after colonoscopy- Last seen by myself 1/22, from that visit: History of DVT dx 12/20, obesity status post lap band. She had a DVT about one year ago- was on xarelto for 3 months and then discontinued   Shingrix- give 2nd dose today  Mammo UTD  Colon completed 10/31/20- [facility].  Normal study. Recheck in 5 years  She also saw her GYN- she had some rectal weakness and did some PT- she was getting better until she did the colonoscopy The prep may have set her back some on her progress.  Per patient report, her gastroenterologist also mentioned she could see general surgery for treatment of rectal concerns, I think hemorrhoids.  Hysterectomy in 2018- she had a complex repair at that time with a long recovery She may get some mild urinary incontinence and rare stool incontinence PT had helped with these sx  She may get stool leakage perhaps once a month -she is able to manage this by using fiber supplements to prevent constipation.  Kathryn Pineda is wondering if she should go ahead and have surgery for her hemorrhoids.  She feels that her current condition is quite livable, and she hesitates to have surgery which is not guaranteed to help, and which could, complications  Her youngest child, her daughter is a first year Psychologist, occupational at [employer].  Her 2 sons are working Patient Active Problem List    Diagnosis Date Noted   DVT (deep venous thrombosis) (HCC) 02/21/2019   Rectocele 11/13/2016   Incomplete uterine prolapse 11/13/2016   Cystocele, midline 11/13/2016   Seasonal allergies 09/17/2014   Incontinence    Obesity (BMI 30-39.9) 12/24/2010   Hx of laparoscopic gastric banding 09/23/2010    Past Medical History:  Diagnosis Date   Anal fissure    Arthritis    Biliary colic 5/96   ERCP w/spincterotomy   Condyloma    Deep vein thrombosis (DVT) of lower extremity (HCC)    Fatigue    loss of sleep   Generalized headaches    GERD (gastroesophageal reflux disease)    Hemorrhoid    Hiatal hernia    Migraines    Obesity    Phlebitis of leg    PONV (postoperative nausea and vomiting)    Primary osteoarthritis of both knees    Stress incontinence    Wilson's disease     Past Surgical History:  Procedure Laterality Date   ANAL FISSURECTOMY  1986   ANTERIOR AND POSTERIOR REPAIR N/A 11/24/2016   Procedure: ANTERIOR (CYSTOCELE) AND POSTERIOR REPAIR (RECTOCELE);  Surgeon: Kathryn Martinez, MD;  Location: [facility];  Service: Urology;  Laterality: N/A;   BUNIONECTOMY Bilateral 2005   CESAREAN SECTION  1990   prolapsed cord   CYSTOSCOPY N/A 11/24/2016   Procedure: CYSTOSCOPY;  Surgeon: Kathryn Martinez, MD;  Location: [facility];  Service: Urology;  Laterality: N/A;   ENDOVENOUS ABLATION SAPHENOUS VEIN W/ LASER  2008   both lower legs   LAPAROSCOPIC CHOLECYSTECTOMY  1997   LAPAROSCOPIC GASTRIC SLEEVE RESECTION WITH HIATAL HERNIA REPAIR  09/16/2010   LAPAROSCOPIC VAGINAL HYSTERECTOMY WITH SALPINGECTOMY Bilateral 11/24/2016   Procedure: LAPAROSCOPIC ASSISTED VAGINAL HYSTERECTOMY WITH SALPINGO-OOPHORECTOMY;  Surgeon: Kathryn Bears, MD;  Location: [facility];  Service: Gynecology;  Laterality: Bilateral;    Social History   Tobacco Use   Smoking status: Never   Smokeless tobacco: Never  Vaping Use   Vaping Use: Never used  Substance Use Topics   Alcohol use: Yes    Alcohol/week:  1.0 standard drink    Types: 1 Glasses of wine per week   Drug use: No    Family History  Problem Relation Age of Onset   Diabetes Father    Colon polyps Father    Osteoarthritis Mother    Hypertension Brother    Cancer Maternal Uncle        leukemia   Cancer Maternal Grandmother        unaware    Allergies  Allergen Reactions   Penicillins Itching and Rash   Adhesive [Tape] Other (See Comments)    Band-Aid causes blisters    Medication list has been reviewed and updated.  Current Outpatient Medications on File Prior to Visit  Medication Sig Dispense Refill   Omega-3 Fatty Acids (FISH OIL) 1000 MG CAPS Take by mouth.     psyllium (METAMUCIL) 58.6 % packet Take 1 packet daily as needed by mouth.     No current facility-administered medications on file prior to visit.    Review of Systems:  As per HPI- otherwise negative.   Physical Examination: Vitals:   01/01/21 1130  BP: 112/60  Pulse: 65  Resp: 18  Temp: 97.8 F (36.6 C)  SpO2: 98%   Vitals:   01/01/21 1130  Weight: 222 lb 12.8 oz (101.1 kg)  Height: 5\' 8"  (1.727 m)   Body mass index is 33.88 kg/m. Ideal Body Weight: Weight in (lb) to have BMI = 25: 164.1  GEN: no acute distress.  Obese, otherwise looks well HEENT: Atraumatic, Normocephalic.  Ears and Nose: No external deformity. CV: RRR, No M/G/R. No JVD. No thrill. No extra heart sounds. PULM: CTA B, no wheezes, crackles, rhonchi. No retractions. No resp. distress. No accessory muscle use. ABD: S, NT, ND, +BS. No rebound. No HSM.  Belly is benign EXTR: No c/c/e PSYCH: Normally interactive. Conversant.    Assessment and Plan: Screening for deficiency anemia - Plan: CBC  Immunization due - Plan: Varicella-zoster vaccine IM (Shingrix)  Screening for diabetes mellitus - Plan: Comprehensive metabolic panel, Hemoglobin A1c  Screening, lipid - Plan: Lipid panel  Screening for thyroid disorder - Plan: TSH  Vitamin D deficiency - Plan: VITAMIN  D 25 Hydroxy (Vit-D Deficiency, Fractures)  Hemorrhoids, unspecified hemorrhoid type  Fecal smearing  Following up today.  Kathryn Pineda recently had a colonoscopy which demonstrated hemorrhoids.  She does have occasional fecal soiling and does use a pad to protect her clothing.  Her gastroenterologist had suggested she look into surgery, but she is not eager to do this.  Right now she feels that she is overall doing okay, she is back to regular exercise and feeling well.  I advised her that I support her decision to not have further surgery at this time  Give second dose Shingrix  Will plan further follow- up pending labs.  Signed Abbe Amsterdam, MD  Received her labs as below, message to patient Results for orders placed or performed in visit on 01/01/21  CBC  Result Value Ref Range   WBC 5.4 4.0 - 10.5 K/uL   RBC 4.15 3.87 - 5.11 Mil/uL   Platelets 271.0 150.0 - 400.0 K/uL   Hemoglobin 13.1 12.0 - 15.0 g/dL   HCT 97.5 88.3 - 25.4 %   MCV 94.9 78.0 - 100.0 fl   MCHC 33.1 30.0 - 36.0 g/dL   RDW 98.2 64.1 - 58.3 %  Comprehensive metabolic panel  Result Value Ref Range   Sodium 140 135 - 145 mEq/L   Potassium 3.9 3.5 - 5.1 mEq/L   Chloride 103 96 - 112 mEq/L   CO2 27 19 - 32 mEq/L   Glucose, Bld 112 (H) 70 - 99 mg/dL   BUN 12 6 - 23 mg/dL   Creatinine, Ser 0.94 0.40 - 1.20 mg/dL   Total Bilirubin 0.6 0.2 - 1.2 mg/dL   Alkaline Phosphatase 55 39 - 117 U/L   AST 16 0 - 37 U/L   ALT 11 0 - 35 U/L   Total Protein 6.6 6.0 - 8.3 g/dL   Albumin 4.5 3.5 - 5.2 g/dL   GFR 07.68 >08.81 mL/min   Calcium 9.4 8.4 - 10.5 mg/dL  Hemoglobin J0R  Result Value Ref Range   Hgb A1c MFr Bld 5.7 4.6 - 6.5 %  Lipid panel  Result Value Ref Range   Cholesterol 194 0 - 200 mg/dL   Triglycerides 15.9 0.0 - 149.0 mg/dL   HDL 45.85 >92.92 mg/dL   VLDL 44.6 0.0 - 28.6 mg/dL   LDL Cholesterol 381 (H) 0 - 99 mg/dL   Total CHOL/HDL Ratio 3    NonHDL 117.36   TSH  Result Value Ref Range   TSH 1.46  0.35 - 5.50 uIU/mL  VITAMIN D 25 Hydroxy (Vit-D Deficiency, Fractures)  Result Value Ref Range   VITD 35.67 30.00 - 100.00 ng/mL

## 2021-01-01 ENCOUNTER — Ambulatory Visit (INDEPENDENT_AMBULATORY_CARE_PROVIDER_SITE_OTHER): Payer: No Typology Code available for payment source | Admitting: Family Medicine

## 2021-01-01 ENCOUNTER — Other Ambulatory Visit: Payer: Self-pay

## 2021-01-01 ENCOUNTER — Encounter: Payer: Self-pay | Admitting: Family Medicine

## 2021-01-01 VITALS — BP 112/60 | HR 65 | Temp 97.8°F | Resp 18 | Ht 68.0 in | Wt 222.8 lb

## 2021-01-01 DIAGNOSIS — Z23 Encounter for immunization: Secondary | ICD-10-CM | POA: Diagnosis not present

## 2021-01-01 DIAGNOSIS — K649 Unspecified hemorrhoids: Secondary | ICD-10-CM

## 2021-01-01 DIAGNOSIS — E559 Vitamin D deficiency, unspecified: Secondary | ICD-10-CM

## 2021-01-01 DIAGNOSIS — Z130 Encounter for screening for diseases of the blood and blood-forming organs and certain disorders involving the immune mechanism: Secondary | ICD-10-CM

## 2021-01-01 DIAGNOSIS — R151 Fecal smearing: Secondary | ICD-10-CM

## 2021-01-01 DIAGNOSIS — Z131 Encounter for screening for diabetes mellitus: Secondary | ICD-10-CM

## 2021-01-01 DIAGNOSIS — Z1329 Encounter for screening for other suspected endocrine disorder: Secondary | ICD-10-CM

## 2021-01-01 DIAGNOSIS — Z13220 Encounter for screening for lipoid disorders: Secondary | ICD-10-CM

## 2021-01-01 LAB — HEMOGLOBIN A1C: Hgb A1c MFr Bld: 5.7 % (ref 4.6–6.5)

## 2021-01-01 LAB — COMPREHENSIVE METABOLIC PANEL
ALT: 11 U/L (ref 0–35)
AST: 16 U/L (ref 0–37)
Albumin: 4.5 g/dL (ref 3.5–5.2)
Alkaline Phosphatase: 55 U/L (ref 39–117)
BUN: 12 mg/dL (ref 6–23)
CO2: 27 mEq/L (ref 19–32)
Calcium: 9.4 mg/dL (ref 8.4–10.5)
Chloride: 103 mEq/L (ref 96–112)
Creatinine, Ser: 0.81 mg/dL (ref 0.40–1.20)
GFR: 77.27 mL/min (ref >60.00)
Glucose, Bld: 112 mg/dL — ABNORMAL HIGH (ref 70–99)
Potassium: 3.9 mEq/L (ref 3.5–5.1)
Sodium: 140 mEq/L (ref 135–145)
Total Bilirubin: 0.6 mg/dL (ref 0.2–1.2)
Total Protein: 6.6 g/dL (ref 6.0–8.3)

## 2021-01-01 LAB — TSH: TSH: 1.46 u[IU]/mL (ref 0.35–5.50)

## 2021-01-01 LAB — LIPID PANEL
Cholesterol: 194 mg/dL (ref 0–200)
HDL: 76.2 mg/dL (ref >39.00)
LDL Cholesterol: 101 mg/dL — ABNORMAL HIGH (ref 0–99)
NonHDL: 117.36
Total CHOL/HDL Ratio: 3
Triglycerides: 84 mg/dL (ref 0.0–149.0)
VLDL: 16.8 mg/dL (ref 0.0–40.0)

## 2021-01-01 LAB — CBC
HCT: 39.4 % (ref 36.0–46.0)
Hemoglobin: 13.1 g/dL (ref 12.0–15.0)
MCHC: 33.1 g/dL (ref 30.0–36.0)
MCV: 94.9 fl (ref 78.0–100.0)
Platelets: 271 10*3/uL (ref 150.0–400.0)
RBC: 4.15 Mil/uL (ref 3.87–5.11)
RDW: 13.6 % (ref 11.5–15.5)
WBC: 5.4 10*3/uL (ref 4.0–10.5)

## 2021-01-01 LAB — VITAMIN D 25 HYDROXY (VIT D DEFICIENCY, FRACTURES): VITD: 35.67 ng/mL (ref 30.00–100.00)

## 2021-01-01 NOTE — Patient Instructions (Signed)
Good to see you again today!  2nd dose of shingrix given I will be in touch with your labs asap I support your tendency to not have any more surgery at this time

## 2021-01-07 ENCOUNTER — Other Ambulatory Visit (HOSPITAL_BASED_OUTPATIENT_CLINIC_OR_DEPARTMENT_OTHER): Payer: Self-pay

## 2021-01-07 MED ORDER — PFIZER COVID-19 VAC BIVALENT 30 MCG/0.3ML IM SUSP
INTRAMUSCULAR | 0 refills | Status: DC
  Filled 2021-01-07: qty 0.3, 1d supply, fill #0

## 2021-01-21 ENCOUNTER — Encounter: Payer: Self-pay | Admitting: Physical Therapy

## 2021-01-21 ENCOUNTER — Encounter: Payer: No Typology Code available for payment source | Attending: Obstetrics & Gynecology | Admitting: Physical Therapy

## 2021-01-21 ENCOUNTER — Other Ambulatory Visit: Payer: Self-pay

## 2021-01-21 DIAGNOSIS — R151 Fecal smearing: Secondary | ICD-10-CM | POA: Diagnosis present

## 2021-01-21 DIAGNOSIS — R252 Cramp and spasm: Secondary | ICD-10-CM

## 2021-01-21 DIAGNOSIS — M6281 Muscle weakness (generalized): Secondary | ICD-10-CM | POA: Diagnosis present

## 2021-01-21 DIAGNOSIS — M533 Sacrococcygeal disorders, not elsewhere classified: Secondary | ICD-10-CM | POA: Diagnosis present

## 2021-01-21 DIAGNOSIS — R278 Other lack of coordination: Secondary | ICD-10-CM | POA: Diagnosis present

## 2021-01-21 NOTE — Therapy (Signed)
[facility] at [facility] [address], [address] [place], [place], [address] Phone: [phone]   Fax:  [phone]  Physical Therapy Treatment  Patient Details  Name: Kathryn Pineda MRN: [id number] Date of Birth: 05-25-1955 Referring Provider (PT): Dr, Valentina Shaggy   Encounter Date: 01/21/2021   PT End of Session - 01/21/21 1401     Visit Number 7    Date for PT Re-Evaluation 04/06/21    Authorization Type [employer]    PT Start Time 1130    PT Stop Time 1230    PT Time Calculation (min) 60 min    Activity Tolerance Patient tolerated treatment well    Behavior During Therapy [facility] for tasks assessed/performed             Past Medical History:  Diagnosis Date   Anal fissure    Arthritis    Biliary colic 5/96   ERCP w/spincterotomy   Condyloma    Deep vein thrombosis (DVT) of lower extremity (HCC)    Fatigue    loss of sleep   Generalized headaches    GERD (gastroesophageal reflux disease)    Hemorrhoid    Hiatal hernia    Migraines    Obesity    Phlebitis of leg    PONV (postoperative nausea and vomiting)    Primary osteoarthritis of both knees    Stress incontinence    Wilson's disease     Past Surgical History:  Procedure Laterality Date   ANAL FISSURECTOMY  1986   ANTERIOR AND POSTERIOR REPAIR N/A 11/24/2016   Procedure: ANTERIOR (CYSTOCELE) AND POSTERIOR REPAIR (RECTOCELE);  Surgeon: Alfredo Martinez, MD;  Location: [facility];  Service: Urology;  Laterality: N/A;   BUNIONECTOMY Bilateral 2005   CESAREAN SECTION  1990   prolapsed cord   CYSTOSCOPY N/A 11/24/2016   Procedure: CYSTOSCOPY;  Surgeon: Alfredo Martinez, MD;  [facility]: [facility];  Service: Urology;  Laterality: N/A;   ENDOVENOUS ABLATION SAPHENOUS VEIN W/ LASER  2008   both lower legs   LAPAROSCOPIC CHOLECYSTECTOMY  1997   LAPAROSCOPIC GASTRIC SLEEVE RESECTION WITH HIATAL HERNIA REPAIR  09/16/2010   LAPAROSCOPIC VAGINAL HYSTERECTOMY WITH SALPINGECTOMY  Bilateral 11/24/2016   Procedure: LAPAROSCOPIC ASSISTED VAGINAL HYSTERECTOMY WITH SALPINGO-OOPHORECTOMY;  Surgeon: [name], MD;  Location: [facility];  Service: Gynecology;  Laterality: Bilateral;    There were no vitals filed for this visit.   Subjective Assessment - 01/21/21 1137     Subjective I have hemmorroids interna and external. I have an area on the right side of the vulva that is painful and sensitive.    Patient Stated Goals reduce the fecal smearing    Currently in Pain? 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    Leisure exercise, reading, watch TV      Cognition  Overall Cognitive Status Within Functional Limits for tasks assessed      Observation/Other Assessments   Skin Integrity decreased mobilitiy of the c-section scar      AROM   Lumbar Flexion tightness in the lumbar    Lumbar Extension decreased by 50%      Strength   Right Hip ABduction 4/5    Left Hip Extension 4-/5    Left Hip ABduction 5/5    Left Hip ADduction 4/5      Palpation   Palpation comment tenderness located right side of the coccygeus                        Pelvic Floor Special Questions - 01/21/21 0001     Number of Pregnancies 4    Number of C-Sections 1    Number of Vaginal Deliveries 3    Any difficulty with labor and deliveries Yes   2nd degree, had a fissure to be repaired   Diastasis Recti 1 finger diastasis above umbilicus    Urinary urgency Yes   when constipated   Fecal incontinence Yes    happened two weeks ago   Fluid intake drinking more water    Perineal Body/Introitus  Elevated    External Palpation whitish area on the right labia minora medial and caudad that is tender and decreased mobitliy    Pelvic Floor Internal Exam Patient confirms identification and approves PT to assess pelvic floor and treatment    Exam Type Vaginal    Palpation tightness along the  posterior vaginal canal and fourchette, decreased mobiltiy on the urethra sphincter, tenderness located in bilateral obturator internist    Strength --   ant. 2/5, post. 1/5              [facility] Adult PT Treatment/Exercise - 01/21/21 0001       Self-Care   Self-Care Other Self-Care Comments    Other Self-Care Comments  educated patient on vaginal moisturizers and how they hel p with vaginal dryness      Manual Therapy   Manual Therapy Internal Pelvic Floor    Internal Pelvic Floor manual work to the obturator internist bilaterally wiht hip movement and trigger point release, release of bilateral urethra sphincter while stabilizing the bladder, release of the anterior wall of the vagianl canal on the left, manual work on the posterior vaginal canal                     PT Education - 01/21/21 1248     Education Details education on vaginal dryness and using moisturizers    Person(s) Educated Patient    Methods Explanation;Handout    Comprehension Verbalized understanding              PT Short Term Goals - 09/18/20 1705       PT SHORT TERM GOAL #1   Title independent with initial HEP    Time 4    Period Weeks    Status Achieved      PT SHORT TERM GOAL #2   Title understand correct toileting technique to fully empty her bowels    Time 4    Period Weeks    Status Achieved      PT SHORT TERM GOAL #3   Title instructed on abdominal massage to reduce fascial restrictions and pain    Time 4    Period Weeks    Status Achieved  PT SHORT TERM GOAL #4   Title pelvic floor  strength >/= 2/5 holding for 10 seconds and fully relax after the contraction    Time 4    Period Weeks    Status Achieved               PT Long Term Goals - 01/21/21 1411       PT LONG TERM GOAL #1   Title Independent with advanced HEP for core and pelvic floor strength to reduce fecal smearing    Time 4    Period Months    Status On-going      PT LONG TERM GOAL #2   Title pelvic floor strength is >/= 3/5 holding for 10 seconds to reduce her wearing a pad to none    Time 4    Period Months    Status On-going      PT LONG TERM GOAL #3   Title able to fully empty her stool due to improve pelvic floor coordination with contraction and full relaxation    Time 4    Period Months    Status Achieved      PT LONG TERM GOAL #4   Title abdominal pain decreased >/= 75% due to improved mobility of stool using the abodminal massage and reduction of fascial restrictions    Time 4    Period Months    Status Achieved      PT LONG TERM GOAL #5   Title coccyx pain reduce from minimal to none due to improved mobility of the coccyx and reduction of trigger points in the pelvic floor muscles.    Time 4    Period Months    Status Achieved      PT LONG TERM GOAL #6   Title able to have penile penetration vaginally with pain decreased </= 0-1 due to improved mobiity of the vaginal tissue and reduction of dryness    Time 4    Period Months    Status Achieved                   Plan - 01/21/21 1402     Clinical Impression Statement Patient pelvic floor strength anteriorly is 2/5 and posteriorly is 1/5. Patient has difficulty with relaxing her pelvic floor. She has tightness in the posterior vaginal canal, perineal body, urethra sphincter, and bilateral obturator intenist. She is able to have a regular bowel movements and sometimes will be constipated with difficulty. When she is constipated her bladder becomes irritated and she has urgency. When she is constipatiend there can be  a mild rectal prolapse and trouble with her hemorroids. Patient has vaginal dryness. She has a whitish spont on the medial section of the labia minora distally that is tender. Patient has had a colonscopy that showed internal and external hemorroids. Patient has had fecal leakage 2 weeks ago but it has reduced. Patient will benefit from skilled therapy to improve pelvic floor coordination and to improve lengthening of the pelvic floor.    Personal Factors and Comorbidities Comorbidity 3+;Sex    Comorbidities Anterior and posterior repair 2020; C-section 1990; Cholecystectomy 1997; laparoscopic gastric sleeve resection and hiatal hernia 09/16/2010; laparoscopic vaginal hysterectomy with salpingectomy 11/24/2016    Examination-Activity Limitations Continence;Toileting;Hygiene/Grooming    Examination-Participation Restrictions Community Activity;Interpersonal Relationship    Stability/Clinical Decision Making Evolving/Moderate complexity    Rehab Potential Excellent    PT Frequency 1x / week    PT Duration 12 weeks    PT  Treatment/Interventions ADLs/Self Care Home Management;Biofeedback;Cryotherapy;Electrical Stimulation;Iontophoresis 4mg /ml Dexamethasone;Moist Heat;Ultrasound;Therapeutic activities;Therapeutic exercise;Neuromuscular re-education;Patient/family education;Manual techniques;Scar mobilization;Dry needling;Spinal Manipulations;Joint Manipulations    PT Next Visit Plan internal work to the obturator inernist, perineal body, posterior vaginal wall, pelvic floor relaxation exercises, see if she has had the white area on her labia looked at    PT Home Exercise Plan Access Code: [email or web address]    Consulted and Agree with Plan of Care Patient             Patient will benefit from skilled therapeutic intervention in order to improve the following deficits and impairments:  Decreased coordination, Decreased range of motion, Increased fascial restricitons, Increased muscle spasms, Pain, Decreased  activity tolerance, Decreased strength, Decreased mobility, Decreased scar mobility  Visit Diagnosis: Muscle weakness (generalized) - Plan: PT plan of care cert/re-cert  Other lack of coordination - Plan: PT plan of care cert/re-cert  Cramp and spasm - Plan: PT plan of care cert/re-cert  Coccyx pain - Plan: PT plan of care cert/re-cert  Fecal smearing - Plan: PT plan of care cert/re-cert     Problem List Patient Active Problem List   Diagnosis Date Noted   DVT (deep venous thrombosis) (HCC) 02/21/2019   Rectocele 11/13/2016   Incomplete uterine prolapse 11/13/2016   Cystocele, midline 11/13/2016   Seasonal allergies 09/17/2014   Incontinence    Obesity (BMI 30-39.9) 12/24/2010   Hx of laparoscopic gastric banding 09/23/2010    11/23/2010, PT 01/21/21 2:14 PM  [facility] [address], [address] [place], [place], [place] Phone: [phone]   Fax:  [phone]  Name: Kathryn Pineda MRN: [id number] Fillers Date of Birth: 08/01/1955

## 2021-01-21 NOTE — Patient Instructions (Addendum)
Moisturizers ?They are used in the vagina to hydrate the mucous membrane that make up the vaginal canal. ?Designed to keep a more normal acid balance (ph) ?Once placed in the vagina, it will last between two to three days.  ?Use 2-3 times per week at bedtime  ?Ingredients to avoid is glycerin and fragrance, can increase chance of infection ?Should not be used just before sex due to causing irritation ?Most are gels administered either in a tampon-shaped applicator or as a vaginal suppository. They are non-hormonal. ? ? ?Types of Moisturizers(internal use) ? ?Vitamin E vaginal suppositories- Whole foods, Amazon ?Moist Again ?Coconut oil- can break down condoms ?Julva- (Do no use if on Tamoxifen) amazon ?Yes moisturizer- amazon ?NeuEve Silk , NeuEve Silver for menopausal or over 65 (if have severe vaginal atrophy or cancer treatments use NeuEve Silk for  1 month than move to NeuEve Silver)- Amazon, Neuve.com ?Olive and Bee intimate cream- www.oliveandbee.com.au ?Mae vaginal moisturizer- Amazon ?Aloe ? ? ? ?Creams to use externally on the Vulva area ?Desert Harvest Releveum (good for for cancer patients that had radiation to the area)- amazon or www.desertharvest.com ?V-magic cream - amazon ?Julva-amazon ?Vital "V Wild Yam salve ( help moisturize and help with thinning vulvar area, does have Beeswax ?MoodMaid Botanical Pro-Meno Wild Yam Cream- Amazon ?Desert Harvest Gele ?Cleo by Damiva labial moisturizer (Amazon,  ?Coconut or olive oil ?aloe ? ? ?Things to avoid in the vaginal area ?Do not use things to irritate the vulvar area ?No lotions just specialized creams for the vulva area- Neogyn, V-magic, No soaps; can use Aveeno or Calendula cleanser if needed. Must be gentle ?No deodorants ?No douches ?Good to sleep without underwear to let the vaginal area to air out ?No scrubbing: spread the lips to let warm water rinse over labias and pat dry  ?. ?Kathryn Pineda, Kathryn Pineda ?[facility] ?[address],  [address] ?[place], [place] [address] ?W: [phone] ?[email or web address] [name] ? ?

## 2021-05-13 NOTE — Progress Notes (Signed)
Nature conservation officer at [employer] ?[address], [address] ?[place], [place] [address] ?336 [phone] ?Fax [phone] ? ?Date:  05/14/2021  ? ?Name:  Kathryn Pineda   DOB:  12-13-1955   MRN:  [id number] ? ?PCP:  Pearline Cables, MD  ? ? ?Chief Complaint: ear discomfort (ear pressure, swollen glands. 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 ) ? ? ?History of Present Illness: ? ?Kathryn Pineda is a 66 y.o. very pleasant female patient who presents with the following: ? ?Patient seen today with concern of acute illness- she is concerned that she might have long covid. She notes she tested positive for covid about 6 months ago.  Since that time she has had perhaps 3 episodes of HA, tender glands and ear pain, leg cramps and rash on her belly. Then sx will go into fatigue and trouble with memory ?Overall sx may last for a couple of weeks and then resolve ? ?Most recent episode started during the last 7 days ?She is not sure if any fever, did not check her temp ?She had some nausea and mild diarrhea but not vomiting noted  ?She tested negative for covid x2 ?Most recently seen by myself in November ?History of DVT in 2020-treated Xarelto for 3 months and then discontinued ? ?Also status post lap band surgery ? ?She has 3 adult children.  Her youngest is in medical school at [employer] ?Patient Active Problem List  ? Diagnosis Date Noted  ? DVT (deep venous thrombosis) (HCC) 02/21/2019  ? Rectocele 11/13/2016  ? Incomplete uterine prolapse 11/13/2016  ? Cystocele, midline 11/13/2016  ? Seasonal allergies 09/17/2014  ? Incontinence   ? Obesity (BMI 30-39.9) 12/24/2010  ? Hx of laparoscopic gastric banding 09/23/2010  ? ? ?Past Medical History:  ?Diagnosis Date  ? Anal fissure   ? Arthritis   ? Biliary colic 5/96  ? ERCP w/spincterotomy  ? Condyloma   ? Deep vein thrombosis (DVT) of lower extremity (HCC)   ? Fatigue   ? loss of sleep  ? Generalized headaches   ? GERD (gastroesophageal reflux disease)   ? Hemorrhoid   ?  Hiatal hernia   ? Migraines   ? Obesity   ? Phlebitis of leg   ? PONV (postoperative nausea and vomiting)   ? Primary osteoarthritis of both knees   ? Stress incontinence   ? Wilson's disease   ? ? ?Past Surgical History:  ?Procedure Laterality Date  ? ANAL FISSURECTOMY  1986  ? ANTERIOR AND POSTERIOR REPAIR N/A 11/24/2016  ? Procedure: ANTERIOR (CYSTOCELE) AND POSTERIOR REPAIR (RECTOCELE);  Surgeon: Alfredo Martinez, MD;  Location: [facility];  Service: Urology;  Laterality: N/A;  ? BUNIONECTOMY Bilateral 2005  ? CESAREAN SECTION  1990  ? prolapsed cord  ? CYSTOSCOPY N/A 11/24/2016  ? Procedure: CYSTOSCOPY;  Surgeon: Alfredo Martinez, MD;  Location: [facility];  Service: Urology;  Laterality: N/A;  ? ENDOVENOUS ABLATION SAPHENOUS VEIN W/ LASER  2008  ? both lower legs  ? LAPAROSCOPIC CHOLECYSTECTOMY  1997  ? LAPAROSCOPIC GASTRIC SLEEVE RESECTION WITH HIATAL HERNIA REPAIR  09/16/2010  ? LAPAROSCOPIC VAGINAL HYSTERECTOMY WITH SALPINGECTOMY Bilateral 11/24/2016  ? Procedure: LAPAROSCOPIC ASSISTED VAGINAL HYSTERECTOMY WITH SALPINGO-OOPHORECTOMY;  Surgeon: [name], MD;  Location: [facility];  Service: Gynecology;  Laterality: Bilateral;  ? ? ?Social History  ? ?Tobacco Use  ? Smoking status: Never  ? Smokeless tobacco: Never  ?Vaping Use  ? Vaping Use: Never used  ?  Substance Use Topics  ? Alcohol use: Yes  ?  Alcohol/week: 1.0 standard drink  ?  Types: 1 Glasses of wine per week  ? Drug use: No  ? ? ?Family History  ?Problem Relation Age of Onset  ? Diabetes Father   ? Colon polyps Father   ? Osteoarthritis Mother   ? Hypertension Brother   ? Cancer Maternal Uncle   ?     leukemia  ? Cancer Maternal Grandmother   ?     unaware  ? ? ?Allergies  ?Allergen Reactions  ? Penicillins Itching and Rash  ? Adhesive [Tape] Other (See Comments)  ?  Band-Aid causes blisters  ? ? ?Medication list has been reviewed and updated. ? ?Current Outpatient Medications on File Prior to Visit  ?Medication Sig Dispense Refill  ? Omega-3 Fatty Acids  (FISH OIL) 1000 MG CAPS Take by mouth.    ? psyllium (METAMUCIL) 58.6 % packet Take 1 packet daily as needed by mouth.    ? ?No current facility-administered medications on file prior to visit.  ? ? ?Review of Systems: ? ?As per HPI- otherwise negative. ? ? ?Physical Examination: ?Vitals:  ? 05/14/21 1550  ?BP: 110/68  ?Pulse: 75  ?Resp: 18  ?Temp: 97.8 ?F (36.6 ?C)  ?SpO2: 99%  ? ?Vitals:  ? 05/14/21 1550  ?Weight: 221 lb 12.8 oz (100.6 kg)  ?Height: 5\' 8"  (1.727 m)  ? ?Body mass index is 33.72 kg/m?. ?Ideal Body Weight: Weight in (lb) to have BMI = 25: 164.1 ? ?GEN: no acute distress.  Mildly obese, otherwise looks well ?HEENT: Atraumatic, Normocephalic.  Bilateral TM wnl, oropharynx normal.  PEERL,EOMI. no dental pain or infection is noted.  Head, neck and mouth exam in detail, all normal.  Nasal cavity normal ?Ears and Nose: No external deformity. ?CV: RRR, No M/G/R. No JVD. No thrill. No extra heart sounds. ?PULM: CTA B, no wheezes, crackles, rhonchi. No retractions. No resp. distress. No accessory muscle use. ?EXTR: No c/c/e ?PSYCH: Normally interactive. Conversant.  ? ? ?Assessment and Plan: ?Cervical lymphadenopathy ? ?Other post infection and related fatigue syndromes ? ?Patient seen today with concern of possible recurrent or long COVID symptoms.  She notes she had COVID-19 perhaps 6 months ago, since then she has had episodic symptoms as described above.  She was worried there could be an issue with her lymph nodes or her ears.  Detailed exam today is reassuring and benign.  I offered to do further work-up such as cervical node ultrasound and/or labs.  Would also potentially try a course of doxycycline.  For the time being she prefers to observe, will let me know if she is not getting back to normal or has any other concerns ? ?Signed ? , MD ? ?

## 2021-05-14 ENCOUNTER — Ambulatory Visit (INDEPENDENT_AMBULATORY_CARE_PROVIDER_SITE_OTHER): Payer: No Typology Code available for payment source | Admitting: Family Medicine

## 2021-05-14 VITALS — BP 110/68 | HR 75 | Temp 97.8°F | Resp 18 | Ht 68.0 in | Wt 221.8 lb

## 2021-05-14 DIAGNOSIS — R590 Localized enlarged lymph nodes: Secondary | ICD-10-CM

## 2021-05-14 DIAGNOSIS — G9339 Other post infection and related fatigue syndromes: Secondary | ICD-10-CM

## 2021-05-14 NOTE — Patient Instructions (Signed)
It was good to see you again today ?Do get a thermometer and check your temp if you feel like you have a fever- we want to know for sure ?

## 2021-07-15 ENCOUNTER — Encounter (HOSPITAL_BASED_OUTPATIENT_CLINIC_OR_DEPARTMENT_OTHER): Payer: Self-pay | Admitting: Obstetrics & Gynecology

## 2021-07-15 ENCOUNTER — Other Ambulatory Visit (HOSPITAL_BASED_OUTPATIENT_CLINIC_OR_DEPARTMENT_OTHER): Payer: Self-pay

## 2021-07-15 ENCOUNTER — Ambulatory Visit (INDEPENDENT_AMBULATORY_CARE_PROVIDER_SITE_OTHER): Payer: No Typology Code available for payment source | Admitting: Obstetrics & Gynecology

## 2021-07-15 VITALS — BP 113/74 | HR 78 | Ht 67.5 in | Wt 218.0 lb

## 2021-07-15 DIAGNOSIS — Z9884 Bariatric surgery status: Secondary | ICD-10-CM | POA: Diagnosis not present

## 2021-07-15 DIAGNOSIS — Z860100 Personal history of colon polyps, unspecified: Secondary | ICD-10-CM | POA: Insufficient documentation

## 2021-07-15 DIAGNOSIS — Z01419 Encounter for gynecological examination (general) (routine) without abnormal findings: Secondary | ICD-10-CM | POA: Diagnosis not present

## 2021-07-15 DIAGNOSIS — R300 Dysuria: Secondary | ICD-10-CM

## 2021-07-15 DIAGNOSIS — Z90710 Acquired absence of both cervix and uterus: Secondary | ICD-10-CM

## 2021-07-15 DIAGNOSIS — I82401 Acute embolism and thrombosis of unspecified deep veins of right lower extremity: Secondary | ICD-10-CM

## 2021-07-15 DIAGNOSIS — N816 Rectocele: Secondary | ICD-10-CM | POA: Diagnosis not present

## 2021-07-15 LAB — POCT URINALYSIS DIPSTICK
Appearance: NORMAL
Bilirubin, UA: NEGATIVE
Glucose, UA: NEGATIVE
Ketones, UA: NEGATIVE
Nitrite, UA: NEGATIVE
Protein, UA: NEGATIVE
Spec Grav, UA: 1.025 (ref 1.010–1.025)
Urobilinogen, UA: 0.2 E.U./dL
pH, UA: 5.5 (ref 5.0–8.0)

## 2021-07-15 MED ORDER — SULFAMETHOXAZOLE-TRIMETHOPRIM 800-160 MG PO TABS
1.0000 | ORAL_TABLET | Freq: Two times a day (BID) | ORAL | 0 refills | Status: DC
  Filled 2021-07-15: qty 6, 3d supply, fill #0

## 2021-07-15 NOTE — Patient Instructions (Signed)
Kathryn Inch, MD Urogynecologist

## 2021-07-15 NOTE — Progress Notes (Signed)
66 y.o. G62P4003 Married White or Caucasian female here for annual exam.  Having some urinary burning.  POCT done today.  Concerned she has long Covid.  Has an area underneath her right chin that seems to enlarge whenever she gets sick.  Would like me to assess today.  She did have a colonscopy last year.  Was told her rectum was not totally normal and that it could be fixed.  Surgeon names were given.  She is not sure she wants to do this.  Uses metamucil regularly.  She's not sure she wants to have anything done right now.  Colonoscopy note was reviewed.  None of the above was mentioned.    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  No LMP recorded. Patient has had a hysterectomy.          Sexually active: Yes.    The current method of family planning is status post hysterectomy.    Smoker:  no  Health Maintenance: Pap:  2018 History of abnormal Pap:  no MMG:  09/06/20 neg Colonoscopy:  10/31/20 BMD:   08/02/19 normal Screening Labs: done in November   reports that she has never smoked. She has never used smokeless tobacco. She reports current alcohol use of about 1.0 standard drink per week. She reports that she does not use drugs.  Past Medical History:  Diagnosis Date   Anal fissure    Arthritis    Biliary colic 5/96   ERCP w/spincterotomy   Condyloma    Deep vein thrombosis (DVT) of lower extremity (HCC)    Fatigue    loss of sleep   Generalized headaches    GERD (gastroesophageal reflux disease)    Hemorrhoid    Hiatal hernia    Migraines    Obesity    Phlebitis of leg    PONV (postoperative nausea and vomiting)    Primary osteoarthritis of both knees    Stress incontinence    Wilson's disease     Past Surgical History:  Procedure Laterality Date   ANAL FISSURECTOMY  1986   ANTERIOR AND POSTERIOR REPAIR N/A 11/24/2016   Procedure: ANTERIOR (CYSTOCELE) AND POSTERIOR REPAIR (RECTOCELE);  Surgeon: Alfredo Martinez, MD;  Location: [facility];  Service: Urology;  Laterality:  N/A;   BUNIONECTOMY Bilateral 2005   CESAREAN SECTION  1990   prolapsed cord   CYSTOSCOPY N/A 11/24/2016   Procedure: CYSTOSCOPY;  Surgeon: Alfredo Martinez, MD;  Location: [facility];  Service: Urology;  Laterality: N/A;   ENDOVENOUS ABLATION SAPHENOUS VEIN W/ LASER  2008   both lower legs   LAPAROSCOPIC CHOLECYSTECTOMY  1997   LAPAROSCOPIC GASTRIC SLEEVE RESECTION WITH HIATAL HERNIA REPAIR  09/16/2010   LAPAROSCOPIC VAGINAL HYSTERECTOMY WITH SALPINGECTOMY Bilateral 11/24/2016   Procedure: LAPAROSCOPIC ASSISTED VAGINAL HYSTERECTOMY WITH SALPINGO-OOPHORECTOMY;  Surgeon: [name], MD;  Location: [facility];  Service: Gynecology;  Laterality: Bilateral;    Current Outpatient Medications  Medication Sig Dispense Refill   psyllium (METAMUCIL) 58.6 % packet Take 1 packet daily as needed by mouth.     Omega-3 Fatty Acids (FISH OIL) 1000 MG CAPS Take by mouth. (Patient not taking: Reported on 07/15/2021)     No current facility-administered medications for this visit.    Family History  Problem Relation Age of Onset   Diabetes Father    Colon polyps Father    Osteoarthritis Mother    Hypertension Brother    Cancer Maternal Uncle        leukemia   Cancer Maternal Grandmother  unaware   ROS: Genitourinary:negative  Exam:   BP 113/74   Pulse 78   Ht 5' 7.5" (1.715 m)   Wt 218 lb (98.9 kg)   BMI 33.64 kg/m   Height: 5' 7.5" (171.5 cm)  General appearance: alert, cooperative and appears stated age Head: Normocephalic, without obvious abnormality, atraumatic Neck: no adenopathy, supple, symmetrical, trachea midline and thyroid normal to inspection and palpation Lungs: clear to auscultation bilaterally Breasts: normal appearance, no masses or tenderness Heart: regular rate and rhythm Abdomen: soft, non-tender; bowel sounds normal; no masses,  no organomegaly Extremities: extremities normal, atraumatic, no cyanosis or edema Skin: Skin color, texture, turgor normal. No rashes or  lesions Lymph nodes: Cervical, supraclavicular, and axillary nodes normal. No abnormal inguinal nodes palpated Neurologic: Grossly normal   Pelvic: External genitalia:  no lesions              Urethra:  normal appearing urethra with no masses, tenderness or lesions              Bartholins and Skenes: normal                 Vagina: normal appearing vagina with normal color and no discharge, no lesions              Cervix: absent              Pap taken: No. Bimanual Exam:  Uterus:  uterus absent              Adnexa: no mass, fullness, tenderness               Rectovaginal: Confirms               Anus:  normal sphincter tone, no lesions  Chaperone, Raechel Ache, RN, was present for exam.  Assessment/Plan: 1. Well woman exam with routine gynecological exam - h/o hysterectomy.  Pap smear not recommended. - MMG 08/2020 - colonoscopy 2022 - BMD 2021.  Repeat 5 years. - vaccines reviewed/updated - lab work done 12/2020  2. Hx of laparoscopic gastric banding  3. Rectocele  4. Personal history of colonic polyps - colonoscopy due 5 years  5. Acute deep vein thrombosis (DVT) of right lower extremity, unspecified vein (HCC) - off blood thinner by hematology  6. History of hysterectomy  7.  Dysuria - urine culture - Bactrim DS bid x 3 days

## 2021-07-16 ENCOUNTER — Ambulatory Visit (HOSPITAL_BASED_OUTPATIENT_CLINIC_OR_DEPARTMENT_OTHER): Payer: No Typology Code available for payment source | Admitting: Obstetrics & Gynecology

## 2021-07-16 LAB — URINE CULTURE

## 2021-07-17 ENCOUNTER — Other Ambulatory Visit (HOSPITAL_BASED_OUTPATIENT_CLINIC_OR_DEPARTMENT_OTHER): Payer: Self-pay | Admitting: Obstetrics & Gynecology

## 2021-07-17 ENCOUNTER — Other Ambulatory Visit (HOSPITAL_BASED_OUTPATIENT_CLINIC_OR_DEPARTMENT_OTHER): Payer: Self-pay

## 2021-07-17 MED ORDER — CEPHALEXIN 250 MG PO CAPS
250.0000 mg | ORAL_CAPSULE | Freq: Four times a day (QID) | ORAL | 0 refills | Status: AC
  Filled 2021-07-17: qty 20, 5d supply, fill #0

## 2021-11-27 ENCOUNTER — Other Ambulatory Visit: Payer: Self-pay | Admitting: Obstetrics & Gynecology

## 2021-11-27 DIAGNOSIS — Z1231 Encounter for screening mammogram for malignant neoplasm of breast: Secondary | ICD-10-CM

## 2022-01-13 ENCOUNTER — Ambulatory Visit
Admission: RE | Admit: 2022-01-13 | Discharge: 2022-01-13 | Disposition: A | Discharge status: Home / Self Care | Payer: No Typology Code available for payment source | Source: Ambulatory Visit | Attending: Obstetrics & Gynecology | Admitting: Obstetrics & Gynecology

## 2022-01-13 DIAGNOSIS — Z1231 Encounter for screening mammogram for malignant neoplasm of breast: Secondary | ICD-10-CM

## 2022-02-27 IMAGING — MG DIGITAL SCREENING BILAT W/ TOMO W/ CAD
6 of 12 series · 6 of 36 positions shown · non-contrast
Comparison: Previous exam(s).

CLINICAL DATA: Screening.

EXAM:
DIGITAL SCREENING BILATERAL MAMMOGRAM WITH TOMO AND CAD

[R MLO synth-2D]
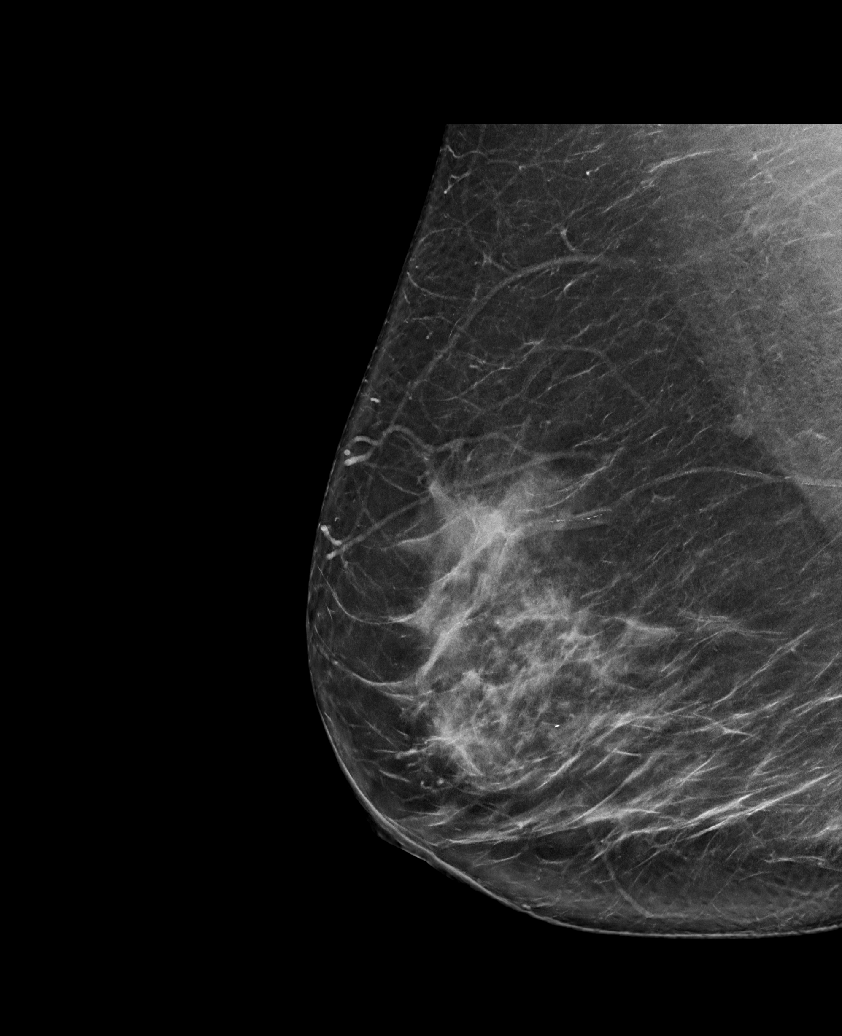

[L CC synth-2D]
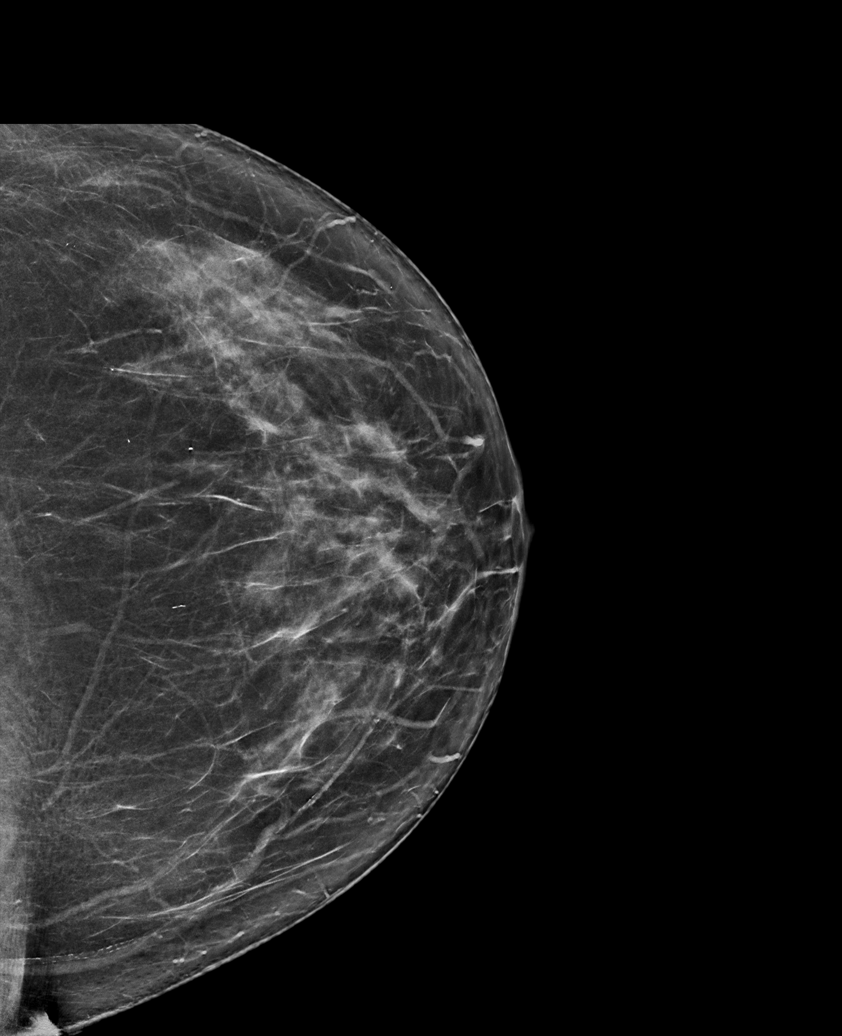

[R CC synth-2D (1 of 2)]
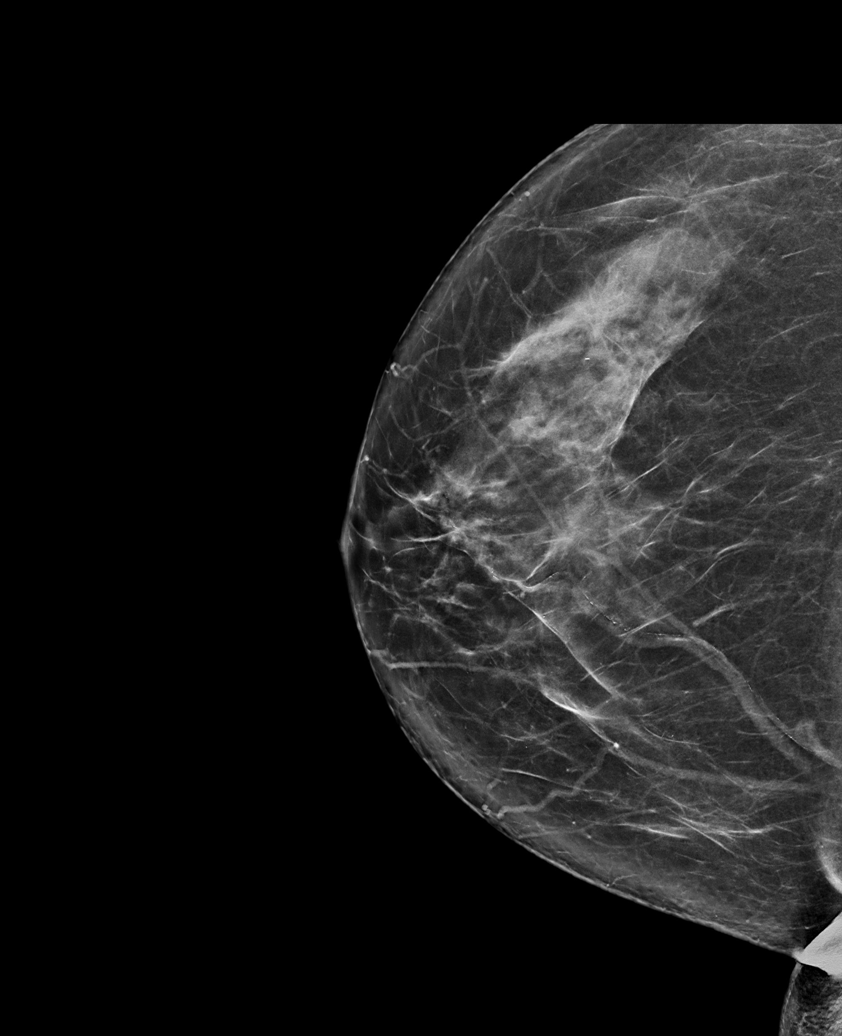

[R CC synth-2D (2 of 2)]
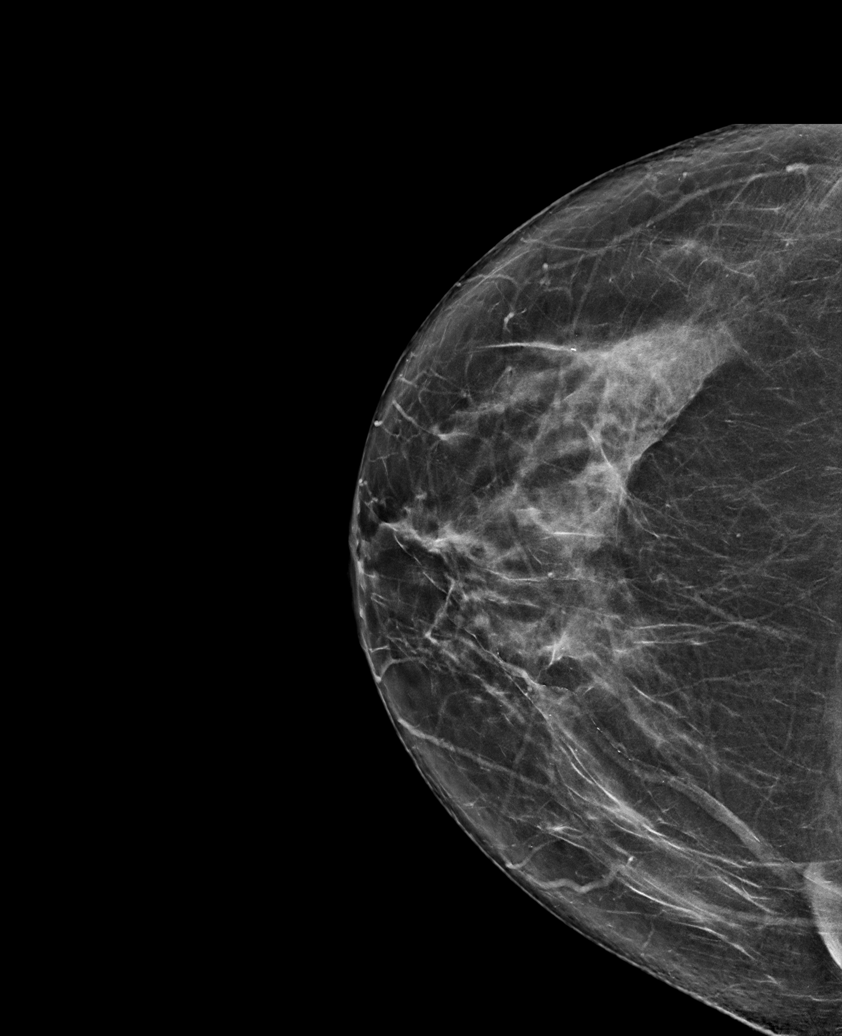

[R CV synth-2D]
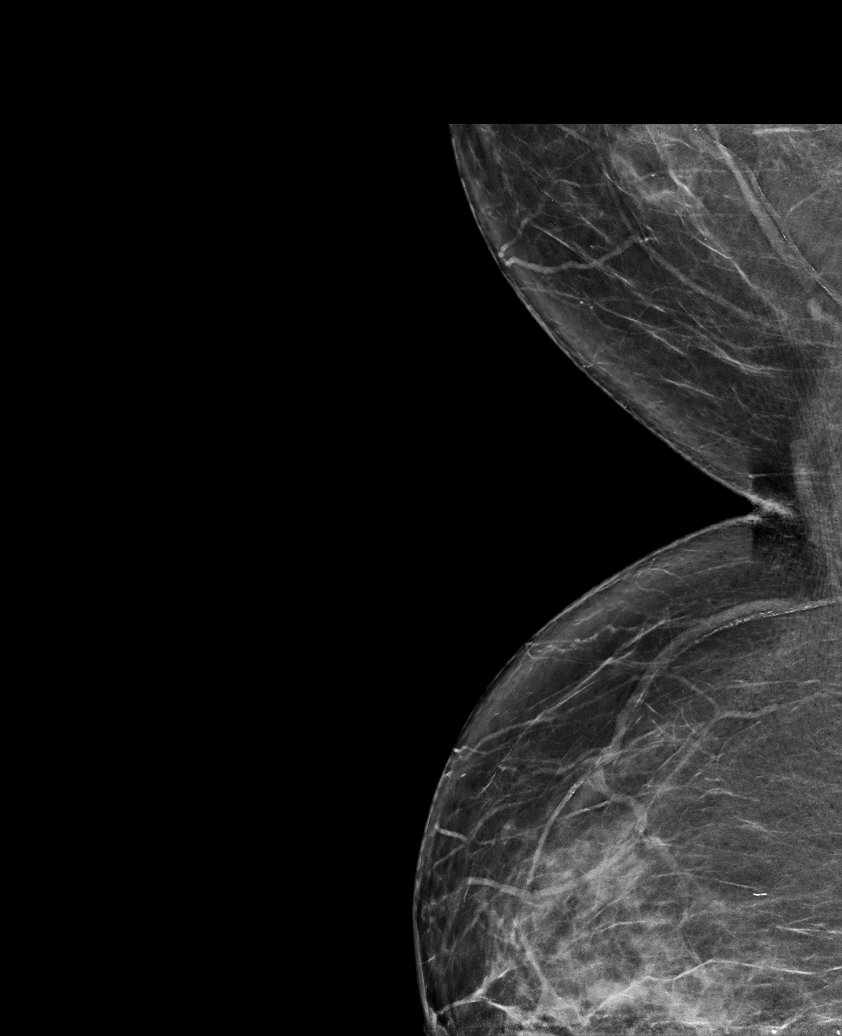

[L MLO synth-2D]
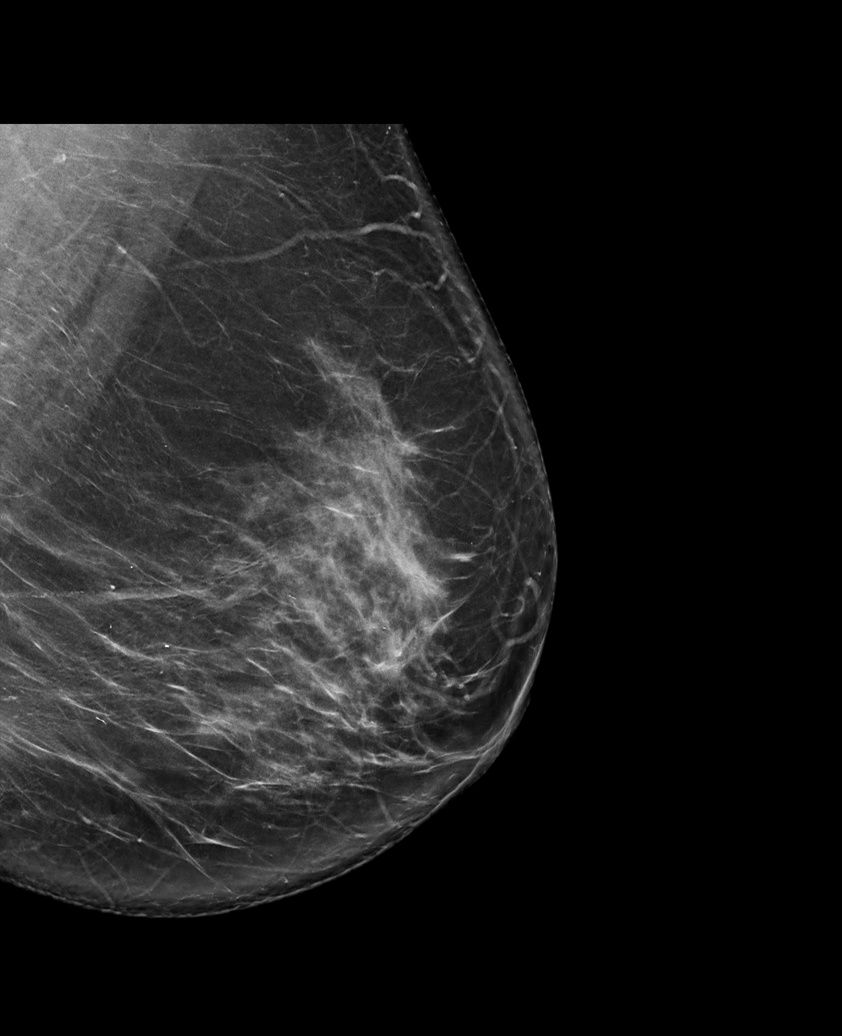

[6 of 36 positions shown; findings below may reference images not displayed]

ACR Breast Density Category c: The breast tissue is heterogeneously
dense, which may obscure small masses.
FINDINGS: There are no findings suspicious for malignancy. Images were
processed with CAD.
IMPRESSION: No mammographic evidence of malignancy. A result letter of this
screening mammogram will be mailed directly to the patient.

RECOMMENDATION:
Screening mammogram in one year. (Code:[id number]-U-LHB)

BI-RADS CATEGORY  1: Negative.

## 2022-03-19 NOTE — Patient Instructions (Addendum)
It was great to see again today, I will be in touch with your labs soon as possible Recommend getting a COVID booster if not up-to-date- also consider RSV Please stop by imaging on the ground floor to schedule or do your coronary calcium test Keep up the good work with exercise

## 2022-03-19 NOTE — Progress Notes (Signed)
[facility], [place], [place] [address] [phone]  Date:  03/23/2022   Name:  Kathryn Pineda   DOB:  [birth year]   MRN:  [id number]  PCP:  Darreld Mclean, MD    Chief Complaint: Annual Exam (Pt states fasting )   History of Present Illness:  Kathryn Pineda is a 67 y.o. very pleasant female patient who presents with the following:  Patient seen today for physical exam Most recent visit with myself March 2023-at that time she was concerned about long COVID symptoms Pt notes she is feeling about back to normal  She does see GYN, visit with Dr. Sabra Heck in May  History of DVT treated with Xarelto for 3 months in 2020, cystocele, lap band surgery She has 3 adult children, her daughter who is her youngest is in medical school at [employer] Her 2 sons are older and out of school   Flu vaccine -done  Recommend COVID booster- recommended  She is thinking about doing RSV Mammogram, colonoscopy up-to-date Can offer updated bone density, completed in June 2021 Most recent lab work November 2022, update today Offer coronary calcium- she is interested  She plans to retire from her job this summer and will be starting medicare  She is an Therapist, 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  Patient Active Problem List   Diagnosis Date Noted   Personal history of colonic polyps 07/15/2021   DVT (deep venous thrombosis) ([facility]) 02/21/2019   Rectocele 11/13/2016   Incomplete uterine prolapse 11/13/2016   Cystocele, midline 11/13/2016   Seasonal allergies 09/17/2014   Incontinence    Obesity (BMI 30-39.9) 12/24/2010   Hx of laparoscopic gastric banding 09/23/2010    Past Medical History:  Diagnosis Date   Anal fissure    Arthritis    Biliary colic 3/71   ERCP w/spincterotomy   Condyloma    Deep vein thrombosis (DVT) of lower extremity (HCC)    Fatigue    loss of sleep   Generalized headaches    GERD  (gastroesophageal reflux disease)    Hemorrhoid    Hiatal hernia    Migraines    Obesity    Phlebitis of leg    PONV (postoperative nausea and vomiting)    Primary osteoarthritis of both knees    Stress incontinence    Wilson's disease     Past Surgical History:  Procedure Laterality Date   ANAL FISSURECTOMY  1986   ANTERIOR AND POSTERIOR REPAIR N/A 11/24/2016   Procedure: ANTERIOR (CYSTOCELE) AND POSTERIOR REPAIR (RECTOCELE);  Surgeon: Bjorn Loser, MD;  Location: [facility];  Service: Urology;  Laterality: N/A;   BUNIONECTOMY Bilateral 2005   CESAREAN SECTION  1990   prolapsed cord   CYSTOSCOPY N/A 11/24/2016   Procedure: CYSTOSCOPY;  Surgeon: Bjorn Loser, MD;  Location: [facility];  Service: Urology;  Laterality: N/A;   ENDOVENOUS ABLATION SAPHENOUS VEIN W/ LASER  2008   both lower legs   LAPAROSCOPIC CHOLECYSTECTOMY  1997   LAPAROSCOPIC GASTRIC SLEEVE RESECTION WITH HIATAL HERNIA REPAIR  09/16/2010   LAPAROSCOPIC VAGINAL HYSTERECTOMY WITH SALPINGECTOMY Bilateral 11/24/2016   Procedure: LAPAROSCOPIC ASSISTED VAGINAL HYSTERECTOMY WITH SALPINGO-OOPHORECTOMY;  Surgeon: Megan Salon, MD;  Location: [facility];  Service: Gynecology;  Laterality: Bilateral;    Social History   Tobacco Use   Smoking status: Never   Smokeless tobacco: Never  Vaping Use   Vaping Use: Never used  Substance Use Topics   Alcohol use: Yes    Alcohol/week: 1.0 standard drink of alcohol    Types: 1 Glasses of wine per week   Drug use: No    Family History  Problem Relation Age of Onset   Diabetes Father    Colon polyps Father    Osteoarthritis Mother    Hypertension Brother    Cancer Maternal Uncle        leukemia   Cancer Maternal Grandmother        unaware    Allergies  Allergen Reactions   Penicillins Itching and Rash   Adhesive [Tape] Other (See Comments)    Band-Aid causes blisters    Medication list has been reviewed and updated.  Current Outpatient Medications on File Prior  to Visit  Medication Sig Dispense Refill   Cholecalciferol (VITAMIN D3) 125 MCG (5000 UT) CAPS Take by mouth.     Cyanocobalamin (B-12 PO) Take by mouth.     loratadine (CLARITIN) 5 MG chewable tablet Chew 5 mg by mouth daily.     Multiple Vitamin (MULTIVITAMIN) tablet Take 1 tablet by mouth daily.     psyllium (METAMUCIL) 58.6 % packet Take 1 packet by mouth daily as needed.     No current facility-administered medications on file prior to visit.    Review of Systems:  As per HPI- otherwise negative.   Physical Examination: Vitals:   03/23/22 0914  BP: 118/78  Pulse: 62  Resp: 18  Temp: 97.6 F (36.4 C)  SpO2: 97%   Vitals:   03/23/22 0914  Weight: 220 lb 6.4 oz (100 kg)  Height: 5' 7.5" (1.715 m)   Body mass index is 34.01 kg/m. Ideal Body Weight: Weight in (lb) to have BMI = 25: 161.7  GEN: no acute distress. Overweight, looks well  HEENT: Atraumatic, Normocephalic.  Bilateral TM wnl, oropharynx normal.  PEERL,EOMI.   Ears and Nose: No external deformity. CV: RRR, No M/G/R. No JVD. No thrill. No extra heart sounds. PULM: CTA B, no wheezes, crackles, rhonchi. No retractions. No resp. distress. No accessory muscle use. ABD: S, NT, ND, +BS. No rebound. No HSM. EXTR: No c/c/e PSYCH: Normally interactive. Conversant.    Assessment and Plan: Physical exam  Thyroid disorder screening - Plan: TSH  Screening for diabetes mellitus - Plan: Comprehensive metabolic panel, Hemoglobin A1c  Screening, lipid - Plan: Lipid panel  Vitamin D deficiency - Plan: VITAMIN D 25 Hydroxy (Vit-D Deficiency, Fractures)  Hx of laparoscopic gastric banding - Plan: CBC, Comprehensive metabolic panel  Estrogen deficiency - Plan: DG Bone Density  Dysuria - Plan: Urine Culture, POCT urinalysis dipstick  Overweight - Plan: CT CARDIAC SCORING (SELF PAY ONLY)  Physical exam today.  Encouraged healthy diet and exercise routine Will plan further follow- up pending labs. Discussed and  ordered coronary calcium   Signed Lamar Blinks, MD  Labs as below, message to patient Results for orders placed or performed in visit on 03/23/22  CBC  Result Value Ref Range   WBC 5.8 4.0 - 10.5 K/uL   RBC 4.14 3.87 - 5.11 Mil/uL   Platelets 278.0 150.0 - 400.0 K/uL   Hemoglobin 13.3 12.0 - 15.0 g/dL   HCT 39.0 36.0 - 46.0 %   MCV 94.1 78.0 - 100.0 fl   MCHC 34.2 30.0 - 36.0 g/dL   RDW 13.2 11.5 - 15.5 %  Comprehensive metabolic panel  Result Value Ref Range   Sodium 140 135 - 145 mEq/L   Potassium  4.1 3.5 - 5.1 mEq/L   Chloride 105 96 - 112 mEq/L   CO2 28 19 - 32 mEq/L   Glucose, Bld 107 (H) 70 - 99 mg/dL   BUN 12 6 - 23 mg/dL   Creatinine, Ser 0.75 0.40 - 1.20 mg/dL   Total Bilirubin 0.5 0.2 - 1.2 mg/dL   Alkaline Phosphatase 53 39 - 117 U/L   AST 14 0 - 37 U/L   ALT 11 0 - 35 U/L   Total Protein 6.5 6.0 - 8.3 g/dL   Albumin 4.3 3.5 - 5.2 g/dL   GFR 84.02 >60.00 mL/min   Calcium 9.1 8.4 - 10.5 mg/dL  Hemoglobin A1c  Result Value Ref Range   Hgb A1c MFr Bld 5.7 4.6 - 6.5 %  Lipid panel  Result Value Ref Range   Cholesterol 197 0 - 200 mg/dL   Triglycerides 59.0 0.0 - 149.0 mg/dL   HDL 72.50 >39.00 mg/dL   VLDL 11.8 0.0 - 40.0 mg/dL   LDL Cholesterol 113 (H) 0 - 99 mg/dL   Total CHOL/HDL Ratio 3    NonHDL 124.81   TSH  Result Value Ref Range   TSH 1.42 0.35 - 5.50 uIU/mL  VITAMIN D 25 Hydroxy (Vit-D Deficiency, Fractures)  Result Value Ref Range   VITD 36.22 30.00 - 100.00 ng/mL  POCT urinalysis dipstick  Result Value Ref Range   Color, UA yellow yellow   Clarity, UA clear clear   Glucose, UA negative negative mg/dL   Bilirubin, UA negative negative   Ketones, POC UA negative negative mg/dL   Spec Grav, UA 1.020 1.010 - 1.025   Blood, UA negative negative   pH, UA 6.0 5.0 - 8.0   Protein Ur, POC negative negative mg/dL   Urobilinogen, UA 0.2 0.2 or 1.0 E.U./dL   Nitrite, UA Negative Negative   Leukocytes, UA Negative Negative

## 2022-03-23 ENCOUNTER — Other Ambulatory Visit (HOSPITAL_BASED_OUTPATIENT_CLINIC_OR_DEPARTMENT_OTHER): Payer: Self-pay

## 2022-03-23 ENCOUNTER — Encounter: Payer: Self-pay | Admitting: Family Medicine

## 2022-03-23 ENCOUNTER — Ambulatory Visit (INDEPENDENT_AMBULATORY_CARE_PROVIDER_SITE_OTHER): Payer: 59 | Admitting: Family Medicine

## 2022-03-23 VITALS — BP 118/78 | HR 62 | Temp 97.6°F | Resp 18 | Ht 67.5 in | Wt 220.4 lb

## 2022-03-23 DIAGNOSIS — E2839 Other primary ovarian failure: Secondary | ICD-10-CM | POA: Diagnosis not present

## 2022-03-23 DIAGNOSIS — Z13220 Encounter for screening for lipoid disorders: Secondary | ICD-10-CM

## 2022-03-23 DIAGNOSIS — Z9884 Bariatric surgery status: Secondary | ICD-10-CM | POA: Diagnosis not present

## 2022-03-23 DIAGNOSIS — E663 Overweight: Secondary | ICD-10-CM | POA: Diagnosis not present

## 2022-03-23 DIAGNOSIS — E559 Vitamin D deficiency, unspecified: Secondary | ICD-10-CM | POA: Diagnosis not present

## 2022-03-23 DIAGNOSIS — Z131 Encounter for screening for diabetes mellitus: Secondary | ICD-10-CM

## 2022-03-23 DIAGNOSIS — Z1329 Encounter for screening for other suspected endocrine disorder: Secondary | ICD-10-CM

## 2022-03-23 DIAGNOSIS — R300 Dysuria: Secondary | ICD-10-CM

## 2022-03-23 DIAGNOSIS — Z0000 Encounter for general adult medical examination without abnormal findings: Secondary | ICD-10-CM

## 2022-03-23 LAB — VITAMIN D 25 HYDROXY (VIT D DEFICIENCY, FRACTURES): VITD: 36.22 ng/mL (ref 30.00–100.00)

## 2022-03-23 LAB — COMPREHENSIVE METABOLIC PANEL
ALT: 11 U/L (ref 0–35)
AST: 14 U/L (ref 0–37)
Albumin: 4.3 g/dL (ref 3.5–5.2)
Alkaline Phosphatase: 53 U/L (ref 39–117)
BUN: 12 mg/dL (ref 6–23)
CO2: 28 mEq/L (ref 19–32)
Calcium: 9.1 mg/dL (ref 8.4–10.5)
Chloride: 105 mEq/L (ref 96–112)
Creatinine, Ser: 0.75 mg/dL (ref 0.40–1.20)
GFR: 84.02 mL/min (ref >60.00)
Glucose, Bld: 107 mg/dL — ABNORMAL HIGH (ref 70–99)
Potassium: 4.1 mEq/L (ref 3.5–5.1)
Sodium: 140 mEq/L (ref 135–145)
Total Bilirubin: 0.5 mg/dL (ref 0.2–1.2)
Total Protein: 6.5 g/dL (ref 6.0–8.3)

## 2022-03-23 LAB — LIPID PANEL
Cholesterol: 197 mg/dL (ref 0–200)
HDL: 72.5 mg/dL (ref >39.00)
LDL Cholesterol: 113 mg/dL — ABNORMAL HIGH (ref 0–99)
NonHDL: 124.81
Total CHOL/HDL Ratio: 3
Triglycerides: 59 mg/dL (ref 0.0–149.0)
VLDL: 11.8 mg/dL (ref 0.0–40.0)

## 2022-03-23 LAB — CBC
HCT: 39 % (ref 36.0–46.0)
Hemoglobin: 13.3 g/dL (ref 12.0–15.0)
MCHC: 34.2 g/dL (ref 30.0–36.0)
MCV: 94.1 fl (ref 78.0–100.0)
Platelets: 278 10*3/uL (ref 150.0–400.0)
RBC: 4.14 Mil/uL (ref 3.87–5.11)
RDW: 13.2 % (ref 11.5–15.5)
WBC: 5.8 10*3/uL (ref 4.0–10.5)

## 2022-03-23 LAB — POCT URINALYSIS DIP (MANUAL ENTRY)
Bilirubin, UA: NEGATIVE
Blood, UA: NEGATIVE
Glucose, UA: NEGATIVE mg/dL
Ketones, POC UA: NEGATIVE mg/dL
Leukocytes, UA: NEGATIVE
Nitrite, UA: NEGATIVE
Protein Ur, POC: NEGATIVE mg/dL
Spec Grav, UA: 1.02 (ref 1.010–1.025)
Urobilinogen, UA: 0.2 E.U./dL
pH, UA: 6 (ref 5.0–8.0)

## 2022-03-23 LAB — TSH: TSH: 1.42 u[IU]/mL (ref 0.35–5.50)

## 2022-03-23 LAB — HEMOGLOBIN A1C: Hgb A1c MFr Bld: 5.7 % (ref 4.6–6.5)

## 2022-03-23 MED ORDER — COMIRNATY 30 MCG/0.3ML IM SUSY
PREFILLED_SYRINGE | INTRAMUSCULAR | 0 refills | Status: DC
  Filled 2022-03-23: qty 0.3, 1d supply, fill #0

## 2022-03-24 ENCOUNTER — Encounter: Payer: Self-pay | Admitting: Family Medicine

## 2022-03-24 LAB — URINE CULTURE
MICRO NUMBER:: 14518701
Result:: NO GROWTH
SPECIMEN QUALITY:: ADEQUATE

## 2022-04-02 ENCOUNTER — Encounter: Payer: Self-pay | Admitting: Family Medicine

## 2022-04-02 ENCOUNTER — Ambulatory Visit (HOSPITAL_BASED_OUTPATIENT_CLINIC_OR_DEPARTMENT_OTHER)
Admission: RE | Admit: 2022-04-02 | Discharge: 2022-04-02 | Disposition: A | Discharge status: Home / Self Care | Payer: 59 | Source: Ambulatory Visit | Attending: Family Medicine | Admitting: Family Medicine

## 2022-04-02 DIAGNOSIS — Z780 Asymptomatic menopausal state: Secondary | ICD-10-CM | POA: Diagnosis not present

## 2022-04-02 DIAGNOSIS — E2839 Other primary ovarian failure: Secondary | ICD-10-CM

## 2022-04-02 DIAGNOSIS — E663 Overweight: Secondary | ICD-10-CM | POA: Insufficient documentation

## 2022-04-06 ENCOUNTER — Encounter: Payer: Self-pay | Admitting: Family Medicine

## 2022-04-10 ENCOUNTER — Telehealth (INDEPENDENT_AMBULATORY_CARE_PROVIDER_SITE_OTHER): Payer: 59 | Admitting: Family

## 2022-04-10 DIAGNOSIS — J069 Acute upper respiratory infection, unspecified: Secondary | ICD-10-CM | POA: Diagnosis not present

## 2022-04-10 NOTE — Assessment & Plan Note (Addendum)
New. She has tested negative for covid by PCR and by rapid test. She did have a known exposure on Sunday to COVID + family member.  I suspect she does have covid with false negative test. We discussed possibility of flu, but with 5 days of symptoms she is outside of Tamiflu window so there is no value in testing at this point.  Discussed supportive measures including mucinex DM which she has already been taking. She continues claritin and I suggested she add sudafed as needed for congestion. Recommended nasal saline spray and salt water gargles for comfort. For pain/fever she may use tylenol or motrin. For sore throat pain we discussed options including cepacol lozenges or chloraseptic spray.  She is advised to call us Monday if symptoms have not improved or if she develops new/worsening symptoms. Discussed she would need an in office visit at that time. Pt verbalizes understanding.

## 2022-04-10 NOTE — Progress Notes (Signed)
MyChart Video Visit    Virtual Visit via Video Note   This visit type was conducted due to national recommendations for restrictions regarding the COVID-19 Pandemic (e.g. social distancing) in an effort to limit this patient's exposure and mitigate transmission in our community. This patient is at least at moderate risk for complications without adequate follow up. This format is felt to be most appropriate for this patient at this time. Physical exam was limited by quality of the video and audio technology used for the visit. CMA was able to get the patient set up on a video visit.  Patient location:  Home Patient and provider in visit Provider location: Office  I discussed the limitations of evaluation and management by telemedicine and the availability of in person appointments. The patient expressed understanding and agreed to proceed.  Visit Date: 04/10/2022  Today's healthcare provider: Nance Pear, NP     Subjective:    Patient ID: Kathryn Pineda, female    DOB: [birth year], 67 y.o.   MRN: [id number]  Chief Complaint  Patient presents with   Sore Throat    Complains of sore throat and hoarsness    Generalized Body Aches    Complains of body aches    HPI Patient is in today for a video visit.  She complains of a sore throat, body aches, runny nose, voice hoarseness, congestion, headaches, mucus production, mild fatigue with exertion. She reports exposure to [facility] 2/18. She developed mild nasal congestion on Monday night and did a home covid test which was negative. On Tuesday she had a PCR test for covid and this was also reportedly negative. She reports that her symptoms began to worsen on Tuesday. She states that she has not been coughing much. She has been taking Claritin daily.   Past Medical History:  Diagnosis Date   Anal fissure    Arthritis    Biliary colic 0000000   ERCP w/spincterotomy   Condyloma    Deep vein thrombosis (DVT) of lower extremity (HCC)     Fatigue    loss of sleep   Generalized headaches    GERD (gastroesophageal reflux disease)    Hemorrhoid    Hiatal hernia    Migraines    Obesity    Phlebitis of leg    PONV (postoperative nausea and vomiting)    Primary osteoarthritis of both knees    Stress incontinence    Wilson's disease     Past Surgical History:  Procedure Laterality Date   ANAL FISSURECTOMY  1986   ANTERIOR AND POSTERIOR REPAIR N/A 11/24/2016   Procedure: ANTERIOR (CYSTOCELE) AND POSTERIOR REPAIR (RECTOCELE);  Surgeon: Bjorn Loser, MD;  Location: [facility];  Service: Urology;  Laterality: N/A;   BUNIONECTOMY Bilateral 2005   CESAREAN SECTION  1990   prolapsed cord   CYSTOSCOPY N/A 11/24/2016   Procedure: CYSTOSCOPY;  Surgeon: Bjorn Loser, MD;  Location: [facility];  Service: Urology;  Laterality: N/A;   ENDOVENOUS ABLATION SAPHENOUS VEIN W/ LASER  2008   both lower legs   LAPAROSCOPIC CHOLECYSTECTOMY  1997   LAPAROSCOPIC GASTRIC SLEEVE RESECTION WITH HIATAL HERNIA REPAIR  09/16/2010   LAPAROSCOPIC VAGINAL HYSTERECTOMY WITH SALPINGECTOMY Bilateral 11/24/2016   Procedure: LAPAROSCOPIC ASSISTED VAGINAL HYSTERECTOMY WITH SALPINGO-OOPHORECTOMY;  Surgeon: Megan Salon, MD;  Location: [facility];  Service: Gynecology;  Laterality: Bilateral;    Family History  Problem Relation Age of Onset   Diabetes Father    Colon polyps Father    Osteoarthritis  Mother    Hypertension Brother    Cancer Maternal Uncle        leukemia   Cancer Maternal Grandmother        unaware    Social History   Socioeconomic History   Marital status: Married    Spouse name: Not on file   Number of children: Not on file   Years of education: Not on file   Highest education level: Not on file  Occupational History   Not on file  Tobacco Use   Smoking status: Never   Smokeless tobacco: Never  Vaping Use   Vaping Use: Never used  Substance and Sexual Activity   Alcohol use: Yes    Alcohol/week: 1.0 standard drink of  alcohol    Types: 1 Glasses of wine per week   Drug use: No   Sexual activity: Yes    Partners: Male    Birth control/protection: Other-see comments, Surgical    Comment: LAVH 11/24/16  Other Topics Concern   Not on file  Social History Narrative   Not on file   Social Determinants of Health   Financial Resource Strain: Not on file  Food Insecurity: Not on file  Transportation Needs: Not on file  Physical Activity: Not on file  Stress: Not on file  Social Connections: Not on file  Intimate Partner Violence: Not on file    Outpatient Medications Prior to Visit  Medication Sig Dispense Refill   Cholecalciferol (VITAMIN D3) 125 MCG (5000 UT) CAPS Take by mouth.     COVID-19 mRNA vaccine 2023-2024 (COMIRNATY) syringe Inject into the muscle. 0.3 mL 0   Cyanocobalamin (B-12 PO) Take by mouth.     loratadine (CLARITIN) 5 MG chewable tablet Chew 5 mg by mouth daily.     Multiple Vitamin (MULTIVITAMIN) tablet Take 1 tablet by mouth daily.     psyllium (METAMUCIL) 58.6 % packet Take 1 packet by mouth daily as needed.     No facility-administered medications prior to visit.    Allergies  Allergen Reactions   Penicillins Itching and Rash   Adhesive [Tape] Other (See Comments)    Band-Aid causes blisters    Review of Systems  Constitutional:  Positive for malaise/fatigue (with exertion).  HENT:  Positive for congestion and sore throat (hoarse, and can barely speak).        (+) runny nose  Respiratory:  Positive for cough (mild) and sputum production.   Musculoskeletal:  Positive for joint pain (elbow pain) and myalgias (body aches).  Neurological:  Positive for headaches.       Objective:    Physical Exam  There were no vitals taken for this visit. Wt Readings from Last 3 Encounters:  03/23/22 220 lb 6.4 oz (100 kg)  07/15/21 218 lb (98.9 kg)  05/14/21 221 lb 12.8 oz (100.6 kg)    Gen: Awake, alert, no acute distress Resp: Breathing is even and non-labored ENT:  voice hoarseness noted Psych: calm/pleasant demeanor Neuro: Alert and Oriented x 3, + facial symmetry, speech is clear.       Assessment & Plan:   Problem List Items Addressed This Visit       Unprioritized   Viral URI - Primary    New. She has tested negative for covid by PCR and by rapid test. She did have a known exposure on Sunday to [place] family member.  I suspect she does have covid with false negative test. We discussed possibility of flu, but with 5  days of symptoms she is outside of Tamiflu window so there is no value in testing at this point.  Discussed supportive measures including mucinex DM which she has already been taking. She continues claritin and I suggested she add sudafed as needed for congestion. Recommended nasal saline spray and salt water gargles for comfort. For pain/fever she may use tylenol or motrin. For sore throat pain we discussed options including cepacol lozenges or chloraseptic spray.  She is advised to call us Monday if symptoms have not improved or if she develops new/worsening symptoms. Discussed she would need an in office visit at that time. Pt verbalizes understanding.         No orders of the defined types were placed in this encounter.   I discussed the assessment and treatment plan with the patient. The patient was provided an opportunity to ask questions and all were answered. The patient agreed with the plan and demonstrated an understanding of the instructions.   The patient was advised to call back or seek an in-person evaluation if the symptoms worsen or if the condition fails to improve as anticipated.   Nance Pear, NP [facility] (phone) [phone] (fax)  [place]

## 2022-06-30 NOTE — Progress Notes (Unsigned)
[facility] [address], [address] [place], [place] [address] [phone]  Date:  07/01/2022   Name:  Kathryn Pineda   DOB:  February 28, 1955   MRN:  [id number]  PCP:  Pearline Cables, MD    Chief Complaint: nose irritation (Pt c/o a mole-like structure in the left nostril. This is come and go. She has tried neosporin and antiinflammatory diet.)   History of Present Illness:  Kathryn Pineda is a 67 y.o. very pleasant female patient who presents with the following:  Patient seen today with concern of her growth in her nose Most recent visit with myself was in February for her physical  History of DVT treated with Xarelto for 3 months in 2020, cystocele, lap band surgery She has 3 adult children, her daughter who is her youngest is in medical school at [employer]- she is a 3rd year right now.  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  Patient Active Problem List   Diagnosis Date Noted   Viral URI 04/10/2022   Personal history of colonic polyps 07/15/2021   DVT (deep venous thrombosis) (HCC) 02/21/2019   Rectocele 11/13/2016   Incomplete uterine prolapse 11/13/2016   Cystocele, midline 11/13/2016   Seasonal allergies 09/17/2014   Incontinence    Obesity (BMI 30-39.9) 12/24/2010   Hx of laparoscopic gastric banding 09/23/2010    Past Medical History:  Diagnosis Date   Anal fissure    Arthritis    Biliary colic 5/96   ERCP w/spincterotomy   Condyloma    Deep vein thrombosis (DVT) of lower extremity (HCC)    Fatigue    loss of sleep   Generalized headaches    GERD (gastroesophageal reflux disease)    Hemorrhoid    Hiatal hernia    Migraines    Obesity    Phlebitis of leg    PONV (postoperative nausea and vomiting)    Primary osteoarthritis of both knees    Stress incontinence    Wilson's disease     Past Surgical  History:  Procedure Laterality Date   ANAL FISSURECTOMY  1986   ANTERIOR AND POSTERIOR REPAIR N/A 11/24/2016   Procedure: ANTERIOR (CYSTOCELE) AND POSTERIOR REPAIR (RECTOCELE);  Surgeon: Alfredo Martinez, MD;  Location: [facility];  Service: Urology;  Laterality: N/A;   BUNIONECTOMY Bilateral 2005   CESAREAN SECTION  1990   prolapsed cord   CYSTOSCOPY N/A 11/24/2016   Procedure: CYSTOSCOPY;  Surgeon: Alfredo Martinez, MD;  Location: [facility];  Service: Urology;  Laterality: N/A;   ENDOVENOUS ABLATION SAPHENOUS VEIN W/ LASER  2008   both lower legs   LAPAROSCOPIC CHOLECYSTECTOMY  1997   LAPAROSCOPIC GASTRIC SLEEVE RESECTION WITH HIATAL HERNIA REPAIR  09/16/2010   LAPAROSCOPIC VAGINAL HYSTERECTOMY WITH SALPINGECTOMY Bilateral 11/24/2016   Procedure: LAPAROSCOPIC ASSISTED VAGINAL HYSTERECTOMY WITH SALPINGO-OOPHORECTOMY;  Surgeon: [name], MD;  Location: [facility];  Service: Gynecology;  Laterality: Bilateral;    Social History   Tobacco Use   Smoking status: Never   Smokeless tobacco: Never  Vaping Use   Vaping Use: Never used  Substance Use Topics   Alcohol use: Yes    Alcohol/week: 1.0 standard drink of alcohol    Types: 1 Glasses of wine per week   Drug use: No    Family History  Problem Relation  Age of Onset   Diabetes Father    Colon polyps Father    Osteoarthritis Mother    Hypertension Brother    Cancer Maternal Uncle        leukemia   Cancer Maternal Grandmother        unaware    Allergies  Allergen Reactions   Penicillins Itching and Rash   Adhesive [Tape] Other (See Comments)    Band-Aid causes blisters    Medication list has been reviewed and updated.  Current Outpatient Medications on File Prior to Visit  Medication Sig Dispense Refill   Cholecalciferol (VITAMIN D3) 125 MCG (5000 UT) CAPS Take by mouth.     COVID-19 mRNA vaccine 2023-2024 (COMIRNATY) syringe Inject into the muscle. 0.3 mL 0   Cyanocobalamin (B-12 PO) Take by mouth.     loratadine  (CLARITIN) 5 MG chewable tablet Chew 5 mg by mouth daily.     Multiple Vitamin (MULTIVITAMIN) tablet Take 1 tablet by mouth daily.     psyllium (METAMUCIL) 58.6 % packet Take 1 packet by mouth daily as needed.     No current facility-administered medications on file prior to visit.    Review of Systems:  As per HPI- otherwise negative.   Physical Examination: Vitals:   07/01/22 1539  BP: 122/72  Pulse: 68  Resp: 18  Temp: 97.9 F (36.6 C)  SpO2: 99%   Vitals:   07/01/22 1539  Weight: 213 lb 6.4 oz (96.8 kg)  Height: 5' 7.5" (1.715 m)   Body mass index is 32.93 kg/m. Ideal Body Weight: Weight in (lb) to have BMI = 25: 161.7  GEN: no acute distress.  Mild obesity. Looks well  HEENT: Atraumatic, Normocephalic.  Not able to appreciate any abnormality in her nose at this time- cannot see any lesion on the septum or other abnl at this time  She does have a pronounced submandibular gland vs node which is mildly tender  Ears and Nose: No external deformity. CV: RRR, No M/G/R. No JVD. No thrill. No extra heart sounds. PULM: CTA B, no wheezes, crackles, rhonchi. No retractions. No resp. distress. No accessory muscle use.Marland Kitchen EXTR: No c/c/e PSYCH: Normally interactive. Conversant.    Assessment and Plan: Salivary gland enlargement - Plan: US Soft Tissue Head/Neck (NON-THYROID)  Sore in nose - Plan: Ambulatory referral to ENT, US Soft Tissue Head/Neck (NON-THYROID)  Sent to [place] to eval node vs salivary gland Referral to ENT to check out are in her nose- I am not able to see anything right now but pt notes her sx seem to wax and wane.  If worse again I am glad to check on it for her when more obvious   Signed Abbe Amsterdam, MD

## 2022-07-01 ENCOUNTER — Ambulatory Visit (INDEPENDENT_AMBULATORY_CARE_PROVIDER_SITE_OTHER): Payer: 59 | Admitting: Family Medicine

## 2022-07-01 VITALS — BP 122/72 | HR 68 | Temp 97.9°F | Resp 18 | Ht 67.5 in | Wt 213.4 lb

## 2022-07-01 DIAGNOSIS — K111 Hypertrophy of salivary gland: Secondary | ICD-10-CM

## 2022-07-01 DIAGNOSIS — J3489 Other specified disorders of nose and nasal sinuses: Secondary | ICD-10-CM

## 2022-07-01 NOTE — Patient Instructions (Signed)
It was good to see you today - we will set you up for an ultrasound of the right sided node vs salivary gland Let me know if the lesion in your nose comes back and I am glad to take a look at it.  We will get you set up with an ENT appt

## 2022-07-02 ENCOUNTER — Ambulatory Visit (HOSPITAL_BASED_OUTPATIENT_CLINIC_OR_DEPARTMENT_OTHER)
Admission: RE | Admit: 2022-07-02 | Discharge: 2022-07-02 | Disposition: A | Discharge status: Home / Self Care | Payer: 59 | Source: Ambulatory Visit | Attending: Family Medicine | Admitting: Family Medicine

## 2022-07-02 DIAGNOSIS — K111 Hypertrophy of salivary gland: Secondary | ICD-10-CM | POA: Diagnosis not present

## 2022-07-02 DIAGNOSIS — R221 Localized swelling, mass and lump, neck: Secondary | ICD-10-CM | POA: Diagnosis not present

## 2022-07-02 DIAGNOSIS — J3489 Other specified disorders of nose and nasal sinuses: Secondary | ICD-10-CM | POA: Diagnosis not present

## 2022-07-04 ENCOUNTER — Encounter (INDEPENDENT_AMBULATORY_CARE_PROVIDER_SITE_OTHER): Payer: 59 | Admitting: Family Medicine

## 2022-07-04 DIAGNOSIS — U071 COVID-19: Secondary | ICD-10-CM

## 2022-07-07 ENCOUNTER — Other Ambulatory Visit (HOSPITAL_BASED_OUTPATIENT_CLINIC_OR_DEPARTMENT_OTHER): Payer: Self-pay

## 2022-07-07 MED ORDER — NIRMATRELVIR/RITONAVIR (PAXLOVID)TABLET
3.0000 | ORAL_TABLET | Freq: Two times a day (BID) | ORAL | 0 refills | Status: DC
Start: 2022-07-07 — End: 2022-07-20
  Filled 2022-07-07: qty 30, 5d supply, fill #0

## 2022-07-07 NOTE — Telephone Encounter (Signed)
Please see the MyChart message reply(ies) for my assessment and plan.  The patient gave consent for this Medical Advice Message and is aware that it may result in a bill to their insurance company as well as the possibility that this may result in a co-payment or deductible. They are an established patient, but are not seeking medical advice exclusively about a problem treated during an in person or video visit in the last 7 days. I did not recommend an in person or video visit within 7 days of my reply.  I spent a total of 10 minutes cumulative time within 7 days through MyChart messaging Brendalyn Vallely, MD  

## 2022-07-07 NOTE — Addendum Note (Signed)
Addended by: Abbe Amsterdam C on: 07/07/2022 05:49 AM   Modules accepted: Orders

## 2022-07-10 ENCOUNTER — Other Ambulatory Visit (HOSPITAL_BASED_OUTPATIENT_CLINIC_OR_DEPARTMENT_OTHER): Payer: Self-pay

## 2022-07-20 ENCOUNTER — Other Ambulatory Visit (HOSPITAL_BASED_OUTPATIENT_CLINIC_OR_DEPARTMENT_OTHER): Payer: Self-pay

## 2022-07-20 MED ORDER — EPINEPHRINE 0.3 MG/0.3ML IJ SOAJ
0.3000 mg | INTRAMUSCULAR | 99 refills | Status: AC | PRN
  Filled 2022-07-20: qty 2, 30d supply, fill #0

## 2022-07-20 NOTE — Addendum Note (Signed)
Addended by: Abbe Amsterdam C on: 07/20/2022 12:41 PM   Modules accepted: Orders

## 2022-07-24 ENCOUNTER — Encounter (HOSPITAL_BASED_OUTPATIENT_CLINIC_OR_DEPARTMENT_OTHER): Payer: Self-pay | Admitting: Obstetrics & Gynecology

## 2022-07-24 ENCOUNTER — Ambulatory Visit (INDEPENDENT_AMBULATORY_CARE_PROVIDER_SITE_OTHER): Payer: 59 | Admitting: Obstetrics & Gynecology

## 2022-07-24 VITALS — BP 122/65 | HR 63 | Ht 68.5 in | Wt 209.2 lb

## 2022-07-24 DIAGNOSIS — N816 Rectocele: Secondary | ICD-10-CM | POA: Diagnosis not present

## 2022-07-24 DIAGNOSIS — Z86718 Personal history of other venous thrombosis and embolism: Secondary | ICD-10-CM

## 2022-07-24 DIAGNOSIS — I8000 Phlebitis and thrombophlebitis of superficial vessels of unspecified lower extremity: Secondary | ICD-10-CM | POA: Insufficient documentation

## 2022-07-24 DIAGNOSIS — K648 Other hemorrhoids: Secondary | ICD-10-CM | POA: Insufficient documentation

## 2022-07-24 DIAGNOSIS — R151 Fecal smearing: Secondary | ICD-10-CM | POA: Diagnosis not present

## 2022-07-24 DIAGNOSIS — Z01419 Encounter for gynecological examination (general) (routine) without abnormal findings: Secondary | ICD-10-CM | POA: Diagnosis not present

## 2022-07-24 NOTE — Patient Instructions (Signed)
Coconut oil Vit E vaginal suppositories -- [employer], can be compounded with [facility] Vit E and Hyaluronic acid gel - Gynetrof [employer] Replens vaginal moisturizer  No estrogen products

## 2022-07-24 NOTE — Progress Notes (Signed)
67 y.o. G71P4003 Married White or Caucasian female here for annual exam.  Doing well.  Reports had Covid a few weeks ago.  Still recovering.    Had coronary calcium score done earlier this year.  Score was zero.  Continuing to have issues with rectocele and leakage of stool.  We discussed this last year as well.  Denies vaginal bleeding.  Doesn't leak urine very much.    H/o DVT.  Having a lot of dryness and discomfort with intercourse.  Cannot use estrogen products.  Reminded pt of this.  No LMP recorded. Patient has had a hysterectomy.          Sexually active: Yes.    The current method of family planning is status post hysterectomy.    Exercise:  yes Smoker:  no  Health Maintenance: Pap:  not indicated History of abnormal Pap:  yes MMG:  01/13/2022 Negative Colonoscopy:  10/31/2020, follow up 5 years BMD:   04/02/2022, normal Screening Labs: 03/2022 with Dr. Patsy Lager   reports that she has never smoked. She has never used smokeless tobacco. She reports current alcohol use of about 1.0 standard drink of alcohol per week. She reports that she does not use drugs.  Past Medical History:  Diagnosis Date   Anal fissure    Arthritis    Biliary colic 5/96   ERCP w/spincterotomy   Condyloma    Deep vein thrombosis (DVT) of lower extremity (HCC)    Fatigue    loss of sleep   Generalized headaches    GERD (gastroesophageal reflux disease)    Hemorrhoid    Hiatal hernia    Migraines    Obesity    Phlebitis of leg    PONV (postoperative nausea and vomiting)    Primary osteoarthritis of both knees    Stress incontinence    Wilson's disease     Past Surgical History:  Procedure Laterality Date   ANAL FISSURECTOMY  1986   ANTERIOR AND POSTERIOR REPAIR N/A 11/24/2016   Procedure: ANTERIOR (CYSTOCELE) AND POSTERIOR REPAIR (RECTOCELE);  Surgeon: Alfredo Martinez, MD;  [facility]: [facility];  Service: Urology;  Laterality: N/A;   BUNIONECTOMY Bilateral 2005   CESAREAN SECTION  1990    prolapsed cord   CYSTOSCOPY N/A 11/24/2016   Procedure: CYSTOSCOPY;  Surgeon: Alfredo Martinez, MD;  [facility]: [facility];  Service: Urology;  Laterality: N/A;   ENDOVENOUS ABLATION SAPHENOUS VEIN W/ LASER  2008   both lower legs   LAPAROSCOPIC CHOLECYSTECTOMY  1997   LAPAROSCOPIC GASTRIC SLEEVE RESECTION WITH HIATAL HERNIA REPAIR  09/16/2010   LAPAROSCOPIC VAGINAL HYSTERECTOMY WITH SALPINGECTOMY Bilateral 11/24/2016   Procedure: LAPAROSCOPIC ASSISTED VAGINAL HYSTERECTOMY WITH SALPINGO-OOPHORECTOMY;  Surgeon: [name], MD;  [facility]: [facility];  Service: Gynecology;  Laterality: Bilateral;    Current Outpatient Medications  Medication Sig Dispense Refill   Cholecalciferol (VITAMIN D3) 125 MCG (5000 UT) CAPS Take by mouth.     Cyanocobalamin (B-12 PO) Take by mouth.     EPINEPHrine (EPIPEN 2-PAK) 0.3 mg/0.3 mL IJ SOAJ injection Inject 0.3 mg into the muscle as needed for anaphylaxis. 2 each PRN   loratadine (CLARITIN) 5 MG chewable tablet Chew 5 mg by mouth daily.     Multiple Vitamin (MULTIVITAMIN) tablet Take 1 tablet by mouth daily.     psyllium (METAMUCIL) 58.6 % packet Take 1 packet by mouth daily as needed.     COVID-19 mRNA vaccine 2023-2024 (COMIRNATY) syringe Inject into the muscle. 0.3 mL 0   No current  facility-administered medications for this visit.    Family History  Problem Relation Age of Onset   Diabetes Father    Colon polyps Father    Osteoarthritis Mother    Hypertension Brother    Cancer Maternal Uncle        leukemia   Cancer Maternal Grandmother        unaware    ROS: Constitutional: negative Genitourinary: incontinence of stool  Exam:   BP 122/65 (BP Location: Right Arm, Patient Position: Sitting, Cuff Size: Large)   Pulse 63   Ht 5' 8.5" (1.74 m) Comment: Reported  Wt 209 lb 3.2 oz (94.9 kg)   BMI 31.35 kg/m   Height: 5' 8.5" (174 cm) (Reported)  General appearance: alert, cooperative and appears stated age Head: Normocephalic, without obvious  abnormality, atraumatic Neck: no adenopathy, supple, symmetrical, trachea midline and thyroid normal to inspection and palpation Lungs: clear to auscultation bilaterally Breasts: normal appearance, no masses or tenderness Heart: regular rate and rhythm Abdomen: soft, non-tender; bowel sounds normal; no masses,  no organomegaly Extremities: extremities normal, atraumatic, no cyanosis or edema Skin: Skin color, texture, turgor normal. No rashes or lesions Lymph nodes: Cervical, supraclavicular, and axillary nodes normal. No abnormal inguinal nodes palpated Neurologic: Grossly normal   Pelvic: External genitalia:  no lesions              Urethra:  normal appearing urethra with no masses, tenderness or lesions              Bartholins and Skenes: normal                 Vagina: atrophic changes, no lesions, cystocele and rectocele noted, vaginal slightly shortened as well              Cervix: absent              Pap taken: No. Bimanual Exam:  Uterus:  uterus absent              Adnexa: no mass, fullness, tenderness               Rectovaginal: Confirms               Anus:  normal sphincter tone, no lesions  Chaperone, Ina Homes, CMA, was present for exam.  Assessment/Plan: 1. Well woman exam with routine gynecological exam - Pap smear not indicated - Mammogram 12/2021 - Colonoscopy 2022.  Follow up 5 years. - Bone mineral density 2024.  Normal. - lab work done with PCP, Dr. Patsy Lager - vaccines reviewed/updated  2. Rectocele and cystocele - Ambulatory referral to Urogynecology  3. Fecal smearing - Ambulatory referral to Urogynecology  4. History of DVT (deep vein thrombosis) - off blood thinner per hematology

## 2022-07-30 DIAGNOSIS — R590 Localized enlarged lymph nodes: Secondary | ICD-10-CM | POA: Diagnosis not present

## 2022-07-30 DIAGNOSIS — H93293 Other abnormal auditory perceptions, bilateral: Secondary | ICD-10-CM | POA: Diagnosis not present

## 2022-08-16 ENCOUNTER — Other Ambulatory Visit: Payer: Self-pay

## 2022-08-16 ENCOUNTER — Emergency Department (HOSPITAL_BASED_OUTPATIENT_CLINIC_OR_DEPARTMENT_OTHER): Payer: 59

## 2022-08-16 ENCOUNTER — Inpatient Hospital Stay (HOSPITAL_BASED_OUTPATIENT_CLINIC_OR_DEPARTMENT_OTHER)
Admission: EM | Admit: 2022-08-16 | Discharge: 2022-08-20 | DRG: 270 | Disposition: A | Discharge status: Home / Self Care | Payer: 59 | Attending: Internal Medicine | Admitting: Internal Medicine

## 2022-08-16 ENCOUNTER — Encounter (HOSPITAL_BASED_OUTPATIENT_CLINIC_OR_DEPARTMENT_OTHER): Payer: Self-pay

## 2022-08-16 DIAGNOSIS — J9811 Atelectasis: Secondary | ICD-10-CM | POA: Diagnosis not present

## 2022-08-16 DIAGNOSIS — K219 Gastro-esophageal reflux disease without esophagitis: Secondary | ICD-10-CM | POA: Diagnosis present

## 2022-08-16 DIAGNOSIS — D649 Anemia, unspecified: Secondary | ICD-10-CM | POA: Diagnosis not present

## 2022-08-16 DIAGNOSIS — I87002 Postthrombotic syndrome without complications of left lower extremity: Secondary | ICD-10-CM | POA: Diagnosis present

## 2022-08-16 DIAGNOSIS — I82412 Acute embolism and thrombosis of left femoral vein: Secondary | ICD-10-CM | POA: Diagnosis not present

## 2022-08-16 DIAGNOSIS — Z83719 Family history of colon polyps, unspecified: Secondary | ICD-10-CM

## 2022-08-16 DIAGNOSIS — I82220 Acute embolism and thrombosis of inferior vena cava: Principal | ICD-10-CM | POA: Diagnosis present

## 2022-08-16 DIAGNOSIS — R519 Headache, unspecified: Secondary | ICD-10-CM | POA: Diagnosis not present

## 2022-08-16 DIAGNOSIS — Z806 Family history of leukemia: Secondary | ICD-10-CM | POA: Diagnosis not present

## 2022-08-16 DIAGNOSIS — Z6832 Body mass index (BMI) 32.0-32.9, adult: Secondary | ICD-10-CM

## 2022-08-16 DIAGNOSIS — Z888 Allergy status to other drugs, medicaments and biological substances status: Secondary | ICD-10-CM | POA: Diagnosis not present

## 2022-08-16 DIAGNOSIS — G43909 Migraine, unspecified, not intractable, without status migrainosus: Secondary | ICD-10-CM | POA: Diagnosis present

## 2022-08-16 DIAGNOSIS — M79662 Pain in left lower leg: Secondary | ICD-10-CM | POA: Diagnosis not present

## 2022-08-16 DIAGNOSIS — I80202 Phlebitis and thrombophlebitis of unspecified deep vessels of left lower extremity: Secondary | ICD-10-CM | POA: Diagnosis present

## 2022-08-16 DIAGNOSIS — I2694 Multiple subsegmental pulmonary emboli without acute cor pulmonale: Secondary | ICD-10-CM | POA: Diagnosis present

## 2022-08-16 DIAGNOSIS — Z833 Family history of diabetes mellitus: Secondary | ICD-10-CM

## 2022-08-16 DIAGNOSIS — Z79890 Hormone replacement therapy (postmenopausal): Secondary | ICD-10-CM | POA: Diagnosis not present

## 2022-08-16 DIAGNOSIS — I82402 Acute embolism and thrombosis of unspecified deep veins of left lower extremity: Secondary | ICD-10-CM | POA: Diagnosis not present

## 2022-08-16 DIAGNOSIS — Z9049 Acquired absence of other specified parts of digestive tract: Secondary | ICD-10-CM | POA: Diagnosis not present

## 2022-08-16 DIAGNOSIS — I2609 Other pulmonary embolism with acute cor pulmonale: Secondary | ICD-10-CM

## 2022-08-16 DIAGNOSIS — Z91048 Other nonmedicinal substance allergy status: Secondary | ICD-10-CM

## 2022-08-16 DIAGNOSIS — M170 Bilateral primary osteoarthritis of knee: Secondary | ICD-10-CM | POA: Diagnosis present

## 2022-08-16 DIAGNOSIS — R739 Hyperglycemia, unspecified: Secondary | ICD-10-CM | POA: Diagnosis not present

## 2022-08-16 DIAGNOSIS — R9431 Abnormal electrocardiogram [ECG] [EKG]: Secondary | ICD-10-CM | POA: Diagnosis not present

## 2022-08-16 DIAGNOSIS — I2699 Other pulmonary embolism without acute cor pulmonale: Secondary | ICD-10-CM | POA: Diagnosis not present

## 2022-08-16 DIAGNOSIS — E872 Acidosis, unspecified: Secondary | ICD-10-CM | POA: Diagnosis not present

## 2022-08-16 DIAGNOSIS — R109 Unspecified abdominal pain: Secondary | ICD-10-CM | POA: Diagnosis not present

## 2022-08-16 DIAGNOSIS — D696 Thrombocytopenia, unspecified: Secondary | ICD-10-CM | POA: Diagnosis present

## 2022-08-16 DIAGNOSIS — Z8616 Personal history of COVID-19: Secondary | ICD-10-CM

## 2022-08-16 DIAGNOSIS — U099 Post covid-19 condition, unspecified: Secondary | ICD-10-CM | POA: Diagnosis present

## 2022-08-16 DIAGNOSIS — E669 Obesity, unspecified: Secondary | ICD-10-CM | POA: Diagnosis present

## 2022-08-16 DIAGNOSIS — I82422 Acute embolism and thrombosis of left iliac vein: Secondary | ICD-10-CM | POA: Diagnosis present

## 2022-08-16 DIAGNOSIS — I82812 Embolism and thrombosis of superficial veins of left lower extremities: Secondary | ICD-10-CM | POA: Diagnosis not present

## 2022-08-16 DIAGNOSIS — R079 Chest pain, unspecified: Secondary | ICD-10-CM | POA: Diagnosis not present

## 2022-08-16 DIAGNOSIS — Z90710 Acquired absence of both cervix and uterus: Secondary | ICD-10-CM | POA: Diagnosis not present

## 2022-08-16 DIAGNOSIS — Z8672 Personal history of thrombophlebitis: Secondary | ICD-10-CM

## 2022-08-16 DIAGNOSIS — I700 Atherosclerosis of aorta: Secondary | ICD-10-CM | POA: Diagnosis not present

## 2022-08-16 DIAGNOSIS — Z880 Allergy status to penicillin: Secondary | ICD-10-CM

## 2022-08-16 DIAGNOSIS — Z8249 Family history of ischemic heart disease and other diseases of the circulatory system: Secondary | ICD-10-CM

## 2022-08-16 DIAGNOSIS — I82492 Acute embolism and thrombosis of other specified deep vein of left lower extremity: Secondary | ICD-10-CM | POA: Diagnosis not present

## 2022-08-16 LAB — CBC
HCT: 34.6 % — ABNORMAL LOW (ref 36.0–46.0)
Hemoglobin: 11.7 g/dL — ABNORMAL LOW (ref 12.0–15.0)
MCH: 32.1 pg (ref 26.0–34.0)
MCHC: 33.8 g/dL (ref 30.0–36.0)
MCV: 94.8 fL (ref 80.0–100.0)
Platelets: 125 10*3/uL — ABNORMAL LOW (ref 150–400)
RBC: 3.65 MIL/uL — ABNORMAL LOW (ref 3.87–5.11)
RDW: 13.6 % (ref 11.5–15.5)
WBC: 7.7 10*3/uL (ref 4.0–10.5)
nRBC: 0 % (ref 0.0–0.2)

## 2022-08-16 LAB — CBC WITH DIFFERENTIAL/PLATELET
Abs Immature Granulocytes: 0.04 10*3/uL (ref 0.00–0.07)
Basophils Absolute: 0.1 10*3/uL (ref 0.0–0.1)
Basophils Relative: 1 %
Eosinophils Absolute: 0.1 10*3/uL (ref 0.0–0.5)
Eosinophils Relative: 2 %
HCT: 37.7 % (ref 36.0–46.0)
Hemoglobin: 12.7 g/dL (ref 12.0–15.0)
Immature Granulocytes: 1 %
Lymphocytes Relative: 11 %
Lymphs Abs: 0.9 10*3/uL (ref 0.7–4.0)
MCH: 31.7 pg (ref 26.0–34.0)
MCHC: 33.7 g/dL (ref 30.0–36.0)
MCV: 94 fL (ref 80.0–100.0)
Monocytes Absolute: 0.8 10*3/uL (ref 0.1–1.0)
Monocytes Relative: 10 %
Neutro Abs: 6.3 10*3/uL (ref 1.7–7.7)
Neutrophils Relative %: 75 %
Platelets: 156 10*3/uL (ref 150–400)
RBC: 4.01 MIL/uL (ref 3.87–5.11)
RDW: 13.5 % (ref 11.5–15.5)
WBC: 8.3 10*3/uL (ref 4.0–10.5)
nRBC: 0 % (ref 0.0–0.2)

## 2022-08-16 LAB — COMPREHENSIVE METABOLIC PANEL
ALT: 14 U/L (ref 0–44)
AST: 19 U/L (ref 15–41)
Albumin: 3.9 g/dL (ref 3.5–5.0)
Alkaline Phosphatase: 59 U/L (ref 38–126)
Anion gap: 10 (ref 5–15)
BUN: 16 mg/dL (ref 8–23)
CO2: 23 mmol/L (ref 22–32)
Calcium: 9.1 mg/dL (ref 8.9–10.3)
Chloride: 104 mmol/L (ref 98–111)
Creatinine, Ser: 0.71 mg/dL (ref 0.44–1.00)
GFR, Estimated: >60 mL/min (ref >60)
Glucose, Bld: 116 mg/dL — ABNORMAL HIGH (ref 70–99)
Potassium: 3.6 mmol/L (ref 3.5–5.1)
Sodium: 137 mmol/L (ref 135–145)
Total Bilirubin: 0.7 mg/dL (ref 0.3–1.2)
Total Protein: 7 g/dL (ref 6.5–8.1)

## 2022-08-16 LAB — LACTIC ACID, PLASMA: Lactic Acid, Venous: 2.4 mmol/L (ref 0.5–1.9)

## 2022-08-16 LAB — MRSA NEXT GEN BY PCR, NASAL: MRSA by PCR Next Gen: NOT DETECTED

## 2022-08-16 LAB — TROPONIN I (HIGH SENSITIVITY)
Troponin I (High Sensitivity): 6 ng/L (ref <18)
Troponin I (High Sensitivity): 7 ng/L (ref <18)
Troponin I (High Sensitivity): 8 ng/L (ref <18)

## 2022-08-16 LAB — GLUCOSE, CAPILLARY: Glucose-Capillary: 100 mg/dL — ABNORMAL HIGH (ref 70–99)

## 2022-08-16 LAB — HEPARIN LEVEL (UNFRACTIONATED): Heparin Unfractionated: >1.1 IU/mL — ABNORMAL HIGH (ref 0.30–0.70)

## 2022-08-16 LAB — BRAIN NATRIURETIC PEPTIDE: B Natriuretic Peptide: 50.1 pg/mL (ref 0.0–100.0)

## 2022-08-16 MED ORDER — HEPARIN BOLUS VIA INFUSION
6000.0000 [IU] | Freq: Once | INTRAVENOUS | Status: AC
  Administered 2022-08-16: 6000 [IU] via INTRAVENOUS

## 2022-08-16 MED ORDER — HEPARIN (PORCINE) 25000 UT/250ML-% IV SOLN
1500.0000 [IU]/h | INTRAVENOUS | Status: DC
  Administered 2022-08-16: 1500 [IU]/h via INTRAVENOUS
  Filled 2022-08-16: qty 250

## 2022-08-16 MED ORDER — IOHEXOL 350 MG/ML SOLN
100.0000 mL | Freq: Once | INTRAVENOUS | Status: AC | PRN
  Administered 2022-08-16: 100 mL via INTRAVENOUS

## 2022-08-16 MED ORDER — LORATADINE 10 MG PO TABS
5.0000 mg | ORAL_TABLET | Freq: Every day | ORAL | Status: DC
  Administered 2022-08-17 – 2022-08-20 (×4): 5 mg via ORAL
  Filled 2022-08-16 (×4): qty 1

## 2022-08-16 MED ORDER — CHLORHEXIDINE GLUCONATE CLOTH 2 % EX PADS
6.0000 | MEDICATED_PAD | Freq: Every day | CUTANEOUS | Status: DC
  Administered 2022-08-16 – 2022-08-20 (×4): 6 via TOPICAL

## 2022-08-16 MED ORDER — ORAL CARE MOUTH RINSE: 15.0000 mL | OROMUCOSAL | Status: DC | PRN

## 2022-08-16 MED ORDER — HEPARIN (PORCINE) 25000 UT/250ML-% IV SOLN
1250.0000 [IU]/h | INTRAVENOUS | Status: DC
  Administered 2022-08-17: 1350 [IU]/h via INTRAVENOUS
  Administered 2022-08-18 (×2): 1200 [IU]/h via INTRAVENOUS
  Administered 2022-08-19: 1250 [IU]/h via INTRAVENOUS
  Filled 2022-08-16 (×4): qty 250

## 2022-08-16 MED ORDER — DOCUSATE SODIUM 100 MG PO CAPS
100.0000 mg | ORAL_CAPSULE | Freq: Two times a day (BID) | ORAL | Status: DC | PRN
  Administered 2022-08-17: 100 mg via ORAL
  Filled 2022-08-16: qty 1

## 2022-08-16 MED ORDER — POLYETHYLENE GLYCOL 3350 17 G PO PACK: 17.0000 g | PACK | Freq: Every day | ORAL | Status: DC | PRN
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
## 2022-08-16 NOTE — Progress Notes (Signed)
[facility] Physician-Brief Progress Note Patient Name: Kathryn Pineda DOB: 15-Jul-1955 MRN: [id number]   Date of Service  08/16/2022  HPI/Events of Note  56 F history of provoked RLE DVT (2020) was on Xarelto for 3 months now presents with left leg pain, with intermittent shortness of breath and lightheadedness. Work up reveals extensive LLE DVT with PE moderate clot burden and evidence of right heart strain.  Seen awake using her phone, stable not in distress  eICU Interventions  Continued on heparin drip     Intervention Category Evaluation Type: New Patient Evaluation  Darl Pikes 08/16/2022, 8:02 PM

## 2022-08-16 NOTE — ED Notes (Signed)
Attempted to call report to [facility]; call was answered, placed on hold x 5 min; no answer

## 2022-08-16 NOTE — ED Notes (Signed)
Called [facility] for transport at 6:27.

## 2022-08-16 NOTE — ED Triage Notes (Signed)
Pt c/o left leg pain that started last week while [place] the beach. Yesterday pain is getting worse and is now in her thigh and buttocks.

## 2022-08-16 NOTE — H&P (Signed)
NAME:  Kathryn Pineda, MRN:  [id number], DOB:  May 09, 1955, LOS: 0 ADMISSION DATE:  08/16/2022, CONSULTATION DATE:  08/16/22 REFERRING MD:  Dalene Seltzer, CHIEF COMPLAINT:  leg pain   History of Present Illness:  67 year old with a history of right lower extremity DVT in 2020 not currently on anticoagulation who is presenting with a week of worsening left leg pain.  Also admits to several episodes of transient shortness of breath and lightheadedness.  Workup in the [facility] emergency room reveals extensive left lower extremity DVT extending up to the iliac (and potentially IVC) as well as pulmonary emboli with a mildly increased RV to LV ratio.  VVS is aware of the patient and likely will intervene on her leg sometime this week.  Pulmonary critical care is consulted to admit to assure stability for this procedure.  Pertinent  Medical History  Wilson's disease which is probably a typo GERD HTN? 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 rate 14, height 5' 8.5" (1.74 m), weight 93.4 kg, SpO2 95 %.       No intake or output data in the 24 hours ending 08/16/22 1832 Filed Weights   08/16/22 1538  Weight: 93.4 kg    Examination: General: NAD HEENT: MM pink/moist Neuro: Aox3; MAE CV: s1s2, RRR, no m/r/g PULM:  dim clear BS bilaterally; room air GI: soft, bsx4 active  Extremities: warm/dry, LLE with swelling Skin: no rashes or lesions    Trop neg BNP neg  CTA reviewed and not very impressive clot burden nor RV strain  Resolved Hospital Problem list   N/A  Assessment & Plan:  Low risk submassive PE LLE DVT Threatened phlegmasia cerulea  dolens Plan: - Would check echo to see if there is a central clot, if there is; would give consideration for systemic TPA - telemetry monitoring - Heparin gtt - LLE neurovascular checks - bedrest - As this is the second PE would recommend lifelong AC - VVS consulted; will see in am  Best Practice (right click and "Reselect all SmartList Selections" daily)   Diet/type: Regular consistency (see orders) DVT prophylaxis: systemic heparin GI prophylaxis: N/A Lines: N/A Foley:  N/A Code Status:  full code Last date of multidisciplinary goals of care discussion [6/30 updated patient and husband at bedside]  Labs   CBC: Recent Labs  Lab 08/16/22 1505 08/16/22 1802  WBC 8.3 7.7  NEUTROABS 6.3  --   HGB 12.7 11.7*  HCT 37.7 34.6*  MCV 94.0 94.8  PLT 156 125*    Basic Metabolic Panel: Recent Labs  Lab 08/16/22 1505  NA 137  K 3.6  CL 104  CO2 23  GLUCOSE 116*  BUN 16  CREATININE 0.71  CALCIUM 9.1   GFR: Estimated Creatinine Clearance: 84.6 mL/min (by C-G formula based on SCr of 0.71 mg/dL). Recent Labs  Lab 08/16/22 1505 08/16/22 1802  WBC 8.3 7.7    Liver Function Tests: Recent Labs  Lab 08/16/22 1505  AST 19  ALT 14  ALKPHOS 59  BILITOT 0.7  PROT 7.0  ALBUMIN 3.9   No results for input(s): "LIPASE", "AMYLASE" in the last 168 hours. No results for  input(s): "AMMONIA" in the last 168 hours.  ABG No results found for: "PHART", "PCO2ART", "PO2ART", "HCO3", "TCO2", "ACIDBASEDEF", "O2SAT"   Coagulation Profile: No results for input(s): "INR", "PROTIME" in the last 168 hours.  Cardiac Enzymes: No results for input(s): "CKTOTAL", "CKMB", "CKMBINDEX", "TROPONINI" in the last 168 hours.  HbA1C: Hgb A1c MFr Bld  Date/Time Value Ref Range Status  03/23/2022 09:48 AM 5.7 4.6 - 6.5 % Final    Comment:    Glycemic Control Guidelines for People with Diabetes:Non Diabetic:  <6%Goal of Therapy: <7%Additional Action Suggested:  >8%   01/01/2021 11:53 AM 5.7  4.6 - 6.5 % Final    Comment:    Glycemic Control Guidelines for People with Diabetes:Non Diabetic:  <6%Goal of Therapy: <7%Additional Action Suggested:  >8%     CBG: No results for input(s): "GLUCAP" in the last 168 hours.  Review of Systems:   Review of Systems  Constitutional:  Negative for fever.  Respiratory:  Negative for shortness of breath.   Cardiovascular:  Positive for leg swelling. Negative for chest pain.  Gastrointestinal:  Negative for nausea and vomiting.  Neurological:  Negative for loss of consciousness.     Past Medical History:  She,  has a past medical history of Anal fissure, Arthritis, Biliary colic (5/96), Condyloma, Deep vein thrombosis (DVT) of lower extremity (HCC), Fatigue, Generalized headaches, GERD (gastroesophageal reflux disease), Hemorrhoid, Hiatal hernia, Migraines, Obesity, Phlebitis of leg, PONV (postoperative nausea and vomiting), Primary osteoarthritis of both knees, Stress incontinence, and Wilson's disease.   Surgical History:   Past Surgical History:  Procedure Laterality Date   ANAL FISSURECTOMY  1986   ANTERIOR AND POSTERIOR REPAIR N/A 11/24/2016   Procedure: ANTERIOR (CYSTOCELE) AND POSTERIOR REPAIR (RECTOCELE);  Surgeon: Alfredo Martinez, MD;  Location: [facility];  Service: Urology;  Laterality: N/A;   BUNIONECTOMY Bilateral 2005   CESAREAN SECTION  1990   prolapsed cord   CYSTOSCOPY N/A 11/24/2016   Procedure: CYSTOSCOPY;  Surgeon: Alfredo Martinez, MD;  Location: [facility];  Service: Urology;  Laterality: N/A;   ENDOVENOUS ABLATION SAPHENOUS VEIN W/ LASER  2008   both lower legs   LAPAROSCOPIC CHOLECYSTECTOMY  1997   LAPAROSCOPIC GASTRIC SLEEVE RESECTION WITH HIATAL HERNIA REPAIR  09/16/2010   LAPAROSCOPIC VAGINAL HYSTERECTOMY WITH SALPINGECTOMY Bilateral 11/24/2016   Procedure: LAPAROSCOPIC ASSISTED VAGINAL HYSTERECTOMY WITH SALPINGO-OOPHORECTOMY;  Surgeon: [name], MD;  Location: [facility];  Service: Gynecology;  Laterality:  Bilateral;     Social History:   reports that she has never smoked. She has never used smokeless tobacco. She reports current alcohol use of about 1.0 standard drink of alcohol per week. She reports that she does not use drugs.   Family History:  Her family history includes Cancer in her maternal grandmother and maternal uncle; Colon polyps in her father; Diabetes in her father; Hypertension in her brother; Osteoarthritis in her mother.   Allergies Allergies  Allergen Reactions   Penicillins Itching and Rash   Adhesive [Tape] Other (See Comments)    Band-Aid causes blisters   Paxlovid [Nirmatrelvir-Ritonavir]     Severe allergic reaction     Home Medications  Prior to Admission medications   Medication Sig Start Date End Date Taking? Authorizing Provider  Cholecalciferol (VITAMIN D3) 125 MCG (5000 UT) CAPS Take by mouth.    [provider]  COVID-19 mRNA vaccine [phone] (COMIRNATY) syringe Inject into the muscle. 03/23/22   Judyann Munson, MD  Cyanocobalamin (B-12 PO) Take by mouth.    [provider]  EPINEPHrine (EPIPEN 2-PAK) 0.3 mg/0.3 mL IJ SOAJ injection Inject 0.3 mg into the muscle as needed for anaphylaxis. 07/20/22   Copland, [name], MD  loratadine (CLARITIN) 5 MG chewable tablet Chew 5 mg by mouth daily.    [provider]  Multiple Vitamin (MULTIVITAMIN) tablet Take 1 tablet by mouth daily.    [provider]  psyllium (METAMUCIL) 58.6 % packet Take 1 packet by mouth daily as needed.    [provider]     Critical care time: 45 minutes    JD Daryel [facility] 08/16/2022, 7:40 PM  Please see [email or web address] for pager details.  From 7A-7P if no response, please call [phone]. After hours, please call ELink [phone].

## 2022-08-16 NOTE — Progress Notes (Signed)
ANTICOAGULATION CONSULT NOTE - Initial Consult  Pharmacy Consult for Heparin infusion Indication: DVT  Allergies  Allergen Reactions   Penicillins Itching and Rash   Adhesive [Tape] Other (See Comments)    Band-Aid causes blisters   Paxlovid [Nirmatrelvir-Ritonavir]     Severe allergic reaction    Patient Measurements: Height: 5' 8.5" (174 cm) Weight: 93.4 kg (206 lb) IBW/kg (Calculated) : 65.05 Heparin Dosing Weight: 85 kg  Vital Signs: Temp: 97.9 F (36.6 C) (06/30 1410) Temp Source: Oral (06/30 1410) BP: 106/61 (06/30 1410) Pulse Rate: 95 (06/30 1410)  Labs: Recent Labs    08/16/22 1505  HGB 12.7  HCT 37.7  PLT 156  CREATININE 0.71    CrCl cannot be calculated (Patient's most recent lab result is older than the maximum 21 days allowed.).   Medical History: Past Medical History:  Diagnosis Date   Anal fissure    Arthritis    Biliary colic 5/96   ERCP w/spincterotomy   Condyloma    Deep vein thrombosis (DVT) of lower extremity (HCC)    Fatigue    loss of sleep   Generalized headaches    GERD (gastroesophageal reflux disease)    Hemorrhoid    Hiatal hernia    Migraines    Obesity    Phlebitis of leg    PONV (postoperative nausea and vomiting)    Primary osteoarthritis of both knees    Stress incontinence    Wilson's disease     Medications:  Scheduled:   Assessment: 67 yo F presents with leg pain extending up to thigh and buttock for past week. Doppler found extensive occlusive DVT of LLE from L iliac vein to the level of the calf veins. Patient was not taking anticoagulation prior to presentation. Pharmacy consulted to dose heparin infusion for treatment of acute DVT.   Hgb 12.7, Plt 156 - stable No s/sx of bleeding  Goal of Therapy:  Heparin level 0.3-0.7 units/ml Monitor platelets by anticoagulation protocol: Yes   Plan:  Give heparin 6000 units IV x1 bolus, followed by  Heparin infusion at 1500 units/hr Check heparin level in 6  hours Monitor daily CBC, heparin levels, and for s/sx of bleeding F/u plan to transition to oral anticoagulationg   Wilburn Cornelia, PharmD, BCPS Clinical Pharmacist 08/16/2022 4:24 PM   Please refer to AMION for pharmacy phone number

## 2022-08-16 NOTE — ED Provider Notes (Signed)
[facility] Provider Note   CSN: [id number] Arrival date & time: 08/16/22  1401     History  Chief Complaint  Patient presents with   Leg Pain    Kathryn Pineda is a 67 y.o. female.  HPI    67 year old female with a history of prior biliary colic with ERCP and sphincterotomy, migraines, Wilson's disease, arthritis, GERD, history of DVT in 2020 that was treated with Xarelto for 3 months, who presents with concern for leg swelling.   In 2020 was on estrogen at the time, had hurt her leg before and had vaccine and thought maybe combination is what triggered DVT.  Had been on xarelto for 3 months.   Reports about 7 to 10 days ago she was at the beach and noted some left leg swelling.  Reports about a month ago she had COVID, laid in bed most of the time, was essentially immobilized per her report, and rode in the car approximately 4 hours to go to the beach prior to developing the symptoms.  Reports that the pain has been slowly getting worse over the last week, and is extended now into her thigh and buttocks.  She has occasional pain through her abdomen.  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  Reported she had some sensation of tingling or rumbling in her chest, but otherwise denies any chest pain.  Has not had any chest pain or shortness of breath today.  Reports that she will get occasional headaches.  Had some nausea earlier this week but not now.  Denies any cough, congestion, fever.  She is no longer on estrogen, no recent surgeries.   Past Medical History:  Diagnosis Date   Anal fissure    Arthritis    Biliary colic 5/96   ERCP w/spincterotomy   Condyloma    Deep vein thrombosis (DVT) of lower extremity (HCC)    Fatigue    loss of sleep   Generalized  headaches    GERD (gastroesophageal reflux disease)    Hemorrhoid    Hiatal hernia    Migraines    Obesity    Phlebitis of leg    PONV (postoperative nausea and vomiting)    Primary osteoarthritis of both knees    Stress incontinence    Wilson's disease      Home Medications Prior to Admission medications   Medication Sig Start Date End Date Taking? Authorizing Provider  Cholecalciferol (VITAMIN D3) 125 MCG (5000 UT) CAPS Take by mouth.    [provider]  COVID-19 mRNA vaccine [phone] (COMIRNATY) syringe Inject into the muscle. 03/23/22   Judyann Munson, MD  Cyanocobalamin (B-12 PO) Take by mouth.    [provider]  EPINEPHrine (EPIPEN 2-PAK) 0.3 mg/0.3 mL IJ SOAJ injection Inject 0.3 mg into the muscle as needed for anaphylaxis. 07/20/22   Copland, [name], MD  loratadine (CLARITIN) 5 MG chewable tablet Chew 5 mg by mouth daily.    [provider]  Multiple Vitamin (MULTIVITAMIN) tablet Take 1 tablet by mouth daily.    [provider]  psyllium (METAMUCIL) 58.6 % packet Take 1 packet by mouth daily as needed.    [provider]      Allergies    Penicillins, Adhesive [tape], and Paxlovid [nirmatrelvir-ritonavir]    Review of  Systems   Review of Systems  Physical Exam Updated Vital Signs BP 117/64   Pulse 68   Temp 97.9 F (36.6 C) (Oral)   Resp 17   Ht 5' 8.5" (1.74 m)   Wt 97.7 kg   SpO2 95%   BMI 32.27 kg/m  Physical Exam Vitals and nursing note reviewed.  Constitutional:      General: She is not in acute distress.    Appearance: She is well-developed. She is not diaphoretic.  HENT:     Head: Normocephalic and atraumatic.  Eyes:     Conjunctiva/sclera: Conjunctivae normal.  Cardiovascular:     Rate and Rhythm: Normal rate and regular rhythm.     Heart sounds: Normal heart sounds. No murmur heard.    No friction rub. No gallop.  Pulmonary:     Effort: Pulmonary effort is normal. No respiratory distress.      Breath sounds: Normal breath sounds. No wheezing or rales.  Abdominal:     General: There is no distension.     Palpations: Abdomen is soft.     Tenderness: There is no abdominal tenderness. There is no guarding.  Musculoskeletal:        General: Swelling (left sided, left leg also with slight purple tint) present. No tenderness.     Cervical back: Normal range of motion.  Skin:    General: Skin is warm and dry.     Findings: No erythema or rash.  Neurological:     Mental Status: She is alert and oriented to person, place, and time.     ED Results / Procedures / Treatments   Labs (all labs ordered are listed, but only abnormal results are displayed) Labs Reviewed  COMPREHENSIVE METABOLIC PANEL - Abnormal; Notable for the following components:      Result Value   Glucose, Bld 116 (*)    All other components within normal limits  HEPARIN LEVEL (UNFRACTIONATED) - Abnormal; Notable for the following components:   Heparin Unfractionated >1.10 (*)    All other components within normal limits  CBC - Abnormal; Notable for the following components:   RBC 3.65 (*)    Hemoglobin 11.7 (*)    HCT 34.6 (*)    Platelets 125 (*)    All other components within normal limits  GLUCOSE, CAPILLARY - Abnormal; Notable for the following components:   Glucose-Capillary 100 (*)    All other components within normal limits  LACTIC ACID, PLASMA - Abnormal; Notable for the following components:   Lactic Acid, Venous 2.4 (*)    All other components within normal limits  MRSA NEXT GEN BY PCR, NASAL  CBC WITH DIFFERENTIAL/PLATELET  BRAIN NATRIURETIC PEPTIDE  HIV ANTIBODY (ROUTINE TESTING W REFLEX)  BASIC METABOLIC PANEL  CBC  HEPARIN LEVEL (UNFRACTIONATED)  LACTIC ACID, PLASMA  TROPONIN I (HIGH SENSITIVITY)  TROPONIN I (HIGH SENSITIVITY)  TROPONIN I (HIGH SENSITIVITY)    EKG None  Radiology CT Angio Chest PE W and/or Wo Contrast  Addendum Date: 08/16/2022   ADDENDUM REPORT: 08/16/2022  18:02 ADDENDUM: These results were called by telephone at the time of interpretation on 08/16/2022 at 6:02 pm to provider [facility] , who verbally acknowledged these results. Electronically Signed   By: Helyn Numbers M.D.   On: 08/16/2022 18:02   Result Date: 08/16/2022 CLINICAL DATA:  Pulmonary embolism (PE) suspected, high prob; Abdominal pain, acute, nonlocalized large DVT, chest pain, occ abd, evaluate extention of clot., Left leg pain, DVT, chest pain. EXAM: CT  ANGIOGRAPHY CHEST CT ABDOMEN AND PELVIS WITH CONTRAST TECHNIQUE: Multidetector CT imaging of the chest was performed using the standard protocol during bolus administration of intravenous contrast. Multiplanar CT image reconstructions and MIPs were obtained to evaluate the vascular anatomy. Multidetector CT imaging of the abdomen and pelvis was performed using the standard protocol during bolus administration of intravenous contrast. RADIATION DOSE REDUCTION: This exam was performed according to the departmental dose-optimization program which includes automated exposure control, adjustment of the mA and/or kV according to patient size and/or use of iterative reconstruction technique. CONTRAST:  OMNIPAQUE IOHEXOL 350 MG/ML SOLN COMPARISON:  04/02/2022, 05/30/2009 FINDINGS: CTA CHEST FINDINGS Cardiovascular: There is adequate opacification the pulmonary arterial tree. There are multiple segmental arterial intraluminal filling defects in keeping with acute pulmonary embolism involving branches within the right upper lobe, right middle lobe, and right lower lobe. The embolic burden is moderate. The central pulmonary arteries are of normal caliber. The right ventricle is dilated, however, there is slight septal shift to the left, and there is inversion of the normal RV/LV ratio (RV/LV = 1.21) in keeping with changes of right heart strain. Global cardiac size is within normal limits. No significant coronary artery calcification. No pericardial  effusion. Minimal atherosclerotic calcification within the thoracic aorta. No aortic aneurysm. Mediastinum/Nodes: Visualized thyroid is unremarkable. No pathologic thoracic adenopathy. Mild circumferential wall thickening involving distal esophagus may reflect changes of esophagitis but is nonspecific. The esophagus is otherwise unremarkable. Lungs/Pleura: Mild bilateral dependent atelectasis. The lungs are otherwise clear. No pneumothorax or pleural effusion. Musculoskeletal: No acute bone abnormality. No lytic or blastic bone lesion. Review of the MIP images confirms the above findings. CT ABDOMEN and PELVIS FINDINGS Hepatobiliary: No focal liver abnormality is seen. Status post cholecystectomy. No biliary dilatation. Pancreas: Unremarkable Spleen: Unremarkable Adrenals/Urinary Tract: No adrenal hemorrhage or renal injury identified. Bladder is unremarkable. Stomach/Bowel: Gastric lap band device is in expected position with the reservoir seen within the right mid abdominal anterior abdominal wall the stomach, small bowel, and large bowel are otherwise unremarkable. No evidence of obstruction or focal inflammation. Appendix normal. No free intraperitoneal gas or fluid. Vascular/Lymphatic: The left common femoral, external iliac, internal iliac, and common iliac veins are acutely thrombosed with expansion and mild perivascular inflammatory stranding. There is, additionally, a small amount of free-floating thrombus extending into the confluence of the inferior vena cava best seen on axial image # 51/2. This thrombus measures roughly 0.8 x 1.3 x 5.3 cm (volume = 3 cm^3). Mild aortoiliac atherosclerotic calcification. No aortic aneurysm. No pathologic adenopathy within the abdomen and pelvis. Reproductive: Status post hysterectomy. No adnexal masses. Other: No abdominal wall hernia or abnormality. No abdominopelvic ascites. Musculoskeletal: Osseous structures are age-appropriate. No acute bone abnormality. No lytic or  blastic bone lesion. Review of the MIP images confirms the above findings. IMPRESSION: 1. Acute pulmonary embolism with moderate embolic burden and CT evidence of right heart strain as described above. (RV/LV Ratio = 1.21) consistent with at least submassive (intermediate risk) PE. The presence of right heart strain has been associated with an increased risk of morbidity and mortality. Please refer to the "Code PE Focused" order set in EPIC. 2. Acute occlusive DVT involving the left common femoral, external iliac, internal iliac, and common iliac veins. Small amount of free-floating thrombus extending into the confluence of the inferior vena cava. 3. Mild circumferential wall thickening involving the distal esophagus may reflect changes of esophagitis but is nonspecific. 4. Gastric lap band device in expected position. Aortic Atherosclerosis (  ICD10-I70.0). Electronically Signed: By: Helyn Numbers M.D. On: 08/16/2022 17:55   CT ABDOMEN PELVIS W CONTRAST  Addendum Date: 08/16/2022   ADDENDUM REPORT: 08/16/2022 18:02 ADDENDUM: These results were called by telephone at the time of interpretation on 08/16/2022 at 6:02 pm to provider [facility] , who verbally acknowledged these results. Electronically Signed   By: Helyn Numbers M.D.   On: 08/16/2022 18:02   Result Date: 08/16/2022 CLINICAL DATA:  Pulmonary embolism (PE) suspected, high prob; Abdominal pain, acute, nonlocalized large DVT, chest pain, occ abd, evaluate extention of clot., Left leg pain, DVT, chest pain. EXAM: CT ANGIOGRAPHY CHEST CT ABDOMEN AND PELVIS WITH CONTRAST TECHNIQUE: Multidetector CT imaging of the chest was performed using the standard protocol during bolus administration of intravenous contrast. Multiplanar CT image reconstructions and MIPs were obtained to evaluate the vascular anatomy. Multidetector CT imaging of the abdomen and pelvis was performed using the standard protocol during bolus administration of intravenous contrast.  RADIATION DOSE REDUCTION: This exam was performed according to the departmental dose-optimization program which includes automated exposure control, adjustment of the mA and/or kV according to patient size and/or use of iterative reconstruction technique. CONTRAST:  OMNIPAQUE IOHEXOL 350 MG/ML SOLN COMPARISON:  04/02/2022, 05/30/2009 FINDINGS: CTA CHEST FINDINGS Cardiovascular: There is adequate opacification the pulmonary arterial tree. There are multiple segmental arterial intraluminal filling defects in keeping with acute pulmonary embolism involving branches within the right upper lobe, right middle lobe, and right lower lobe. The embolic burden is moderate. The central pulmonary arteries are of normal caliber. The right ventricle is dilated, however, there is slight septal shift to the left, and there is inversion of the normal RV/LV ratio (RV/LV = 1.21) in keeping with changes of right heart strain. Global cardiac size is within normal limits. No significant coronary artery calcification. No pericardial effusion. Minimal atherosclerotic calcification within the thoracic aorta. No aortic aneurysm. Mediastinum/Nodes: Visualized thyroid is unremarkable. No pathologic thoracic adenopathy. Mild circumferential wall thickening involving distal esophagus may reflect changes of esophagitis but is nonspecific. The esophagus is otherwise unremarkable. Lungs/Pleura: Mild bilateral dependent atelectasis. The lungs are otherwise clear. No pneumothorax or pleural effusion. Musculoskeletal: No acute bone abnormality. No lytic or blastic bone lesion. Review of the MIP images confirms the above findings. CT ABDOMEN and PELVIS FINDINGS Hepatobiliary: No focal liver abnormality is seen. Status post cholecystectomy. No biliary dilatation. Pancreas: Unremarkable Spleen: Unremarkable Adrenals/Urinary Tract: No adrenal hemorrhage or renal injury identified. Bladder is unremarkable. Stomach/Bowel: Gastric lap band device is in  expected position with the reservoir seen within the right mid abdominal anterior abdominal wall the stomach, small bowel, and large bowel are otherwise unremarkable. No evidence of obstruction or focal inflammation. Appendix normal. No free intraperitoneal gas or fluid. Vascular/Lymphatic: The left common femoral, external iliac, internal iliac, and common iliac veins are acutely thrombosed with expansion and mild perivascular inflammatory stranding. There is, additionally, a small amount of free-floating thrombus extending into the confluence of the inferior vena cava best seen on axial image # 51/2. This thrombus measures roughly 0.8 x 1.3 x 5.3 cm (volume = 3 cm^3). Mild aortoiliac atherosclerotic calcification. No aortic aneurysm. No pathologic adenopathy within the abdomen and pelvis. Reproductive: Status post hysterectomy. No adnexal masses. Other: No abdominal wall hernia or abnormality. No abdominopelvic ascites. Musculoskeletal: Osseous structures are age-appropriate. No acute bone abnormality. No lytic or blastic bone lesion. Review of the MIP images confirms the above findings. IMPRESSION: 1. Acute pulmonary embolism with moderate embolic burden and CT evidence  of right heart strain as described above. (RV/LV Ratio = 1.21) consistent with at least submassive (intermediate risk) PE. The presence of right heart strain has been associated with an increased risk of morbidity and mortality. Please refer to the "Code PE Focused" order set in EPIC. 2. Acute occlusive DVT involving the left common femoral, external iliac, internal iliac, and common iliac veins. Small amount of free-floating thrombus extending into the confluence of the inferior vena cava. 3. Mild circumferential wall thickening involving the distal esophagus may reflect changes of esophagitis but is nonspecific. 4. Gastric lap band device in expected position. Aortic Atherosclerosis (ICD10-I70.0). Electronically Signed: By: Helyn Numbers M.D.  On: 08/16/2022 17:55   CT Head Wo Contrast  Result Date: 08/16/2022 CLINICAL DATA:  Headache EXAM: CT HEAD WITHOUT CONTRAST TECHNIQUE: Contiguous axial images were obtained from the base of the skull through the vertex without intravenous contrast. RADIATION DOSE REDUCTION: This exam was performed according to the departmental dose-optimization program which includes automated exposure control, adjustment of the mA and/or kV according to patient size and/or use of iterative reconstruction technique. COMPARISON:  08/03/2017 FINDINGS: Brain: No evidence of acute infarction, hemorrhage, hydrocephalus, extra-axial collection or mass lesion/mass effect. Vascular: No hyperdense vessel or unexpected calcification. Skull: Normal. Negative for fracture or focal lesion. Sinuses/Orbits: No acute finding. Other: None. IMPRESSION: No acute intracranial pathology. Electronically Signed   By: Jearld Lesch M.D.   On: 08/16/2022 18:00   US Venous Img Lower  Left (DVT Study)  Result Date: 08/16/2022 CLINICAL DATA:  Left calf and buttock pain for 1 week EXAM: LEFT LOWER EXTREMITY VENOUS DOPPLER ULTRASOUND TECHNIQUE: Gray-scale sonography with graded compression, as well as color Doppler and duplex ultrasound were performed to evaluate the lower extremity deep venous systems from the level of the common femoral vein and including the common femoral, femoral, profunda femoral, popliteal and calf veins including the posterior tibial, peroneal and gastrocnemius veins when visible. The superficial great saphenous vein was also interrogated. Spectral Doppler was utilized to evaluate flow at rest and with distal augmentation maneuvers in the common femoral, femoral and popliteal veins. COMPARISON:  None Available. FINDINGS: Contralateral Common Femoral Vein: Respiratory phasicity is normal and symmetric with the symptomatic side. No evidence of thrombus. Normal compressibility. There is nearly occlusive thrombus involving the left  common femoral vein, proximal left profunda vein, left femoral vein, left popliteal vein and the visualized calf veins. There is also thrombus seen within the superficial left greater saphenous vein. Venous Reflux:  None. Other Findings: Provided static images of the left iliac vein (unclear exact level based on image annotation) also demonstrate occlusive thrombus. The inferior vena cava is not well-visualized. IMPRESSION: Extensive occlusive deep vein thrombosis of the left lower extremity, visualized most centrally at the level of the left iliac vein and extending to the level of the calf veins. The IVC is not well visualized, however thrombus at this level is not excluded. Recommend further assessment with CT abdomen and pelvis venogram protocol to assess for possible central extension of thrombus. These findings were discussed with Ralph Leyden, PA by Dr. Jacob Moores at 3:05 pm on 08/16/2022. Electronically Signed   By: Jacob Moores M.D.   On: 08/16/2022 15:10    Procedures Procedures    Medications Ordered in ED Medications  Chlorhexidine Gluconate Cloth 2 % PADS 6 each (6 each Topical Given 08/16/22 1936)  docusate sodium (COLACE) capsule 100 mg (has no administration in time range)  polyethylene glycol (MIRALAX / GLYCOLAX) packet  17 g (has no administration in time range)  loratadine (CLARITIN) tablet 5 mg (has no administration in time range)  Oral care mouth rinse (has no administration in time range)  heparin ADULT infusion 100 units/mL (25000 units/239mL) (1,350 Units/hr Intravenous Restarted 08/17/22 0110)  heparin bolus via infusion 6,000 Units (6,000 Units Intravenous Bolus from Bag 08/16/22 1642)  iohexol (OMNIPAQUE) 350 MG/ML injection 100 mL (100 mLs Intravenous Contrast Given 08/16/22 1705)    ED Course/ Medical Decision Making/ A&P                              67 year old female with a history of prior biliary colic with ERCP and sphincterotomy, migraines, Wilson's  disease, arthritis, GERD, history of DVT in 2020 that was treated with Xarelto for 3 months, who presents with concern for leg swelling.   DVT study completed and shows extensive occlusive deep vein thrombosis of the left lower extremity, visualized mass centrally at the level of the left iliac vein and extending to the level of the calf veins, IVC is not visualized however thrombus of the levels not excluded.  Concern on exam for purpleish tint of the skin, possible phlegmasia cerulea dolens in combination with this ultrasound.  Pulses normal.  Discussed with Dr. Myra Gianotti of vascular surgery who reports the admitting team can call when she gets to [facility].  Given her intermittent shortness of breath and lightheadedness, ordered CT PE study which showed PE with right heart strain.  CT abdomen pelvis is ordered given extension of thrombus which shows acute occlusive DVT involving the left common femoral, external iliac, internal iliac, and common iliac veins and small amount of free-floating thrombus extending into the confluence of the inferior vena cava.  Did order head CT intermittent headaches and plan for anticoagulation which was negative for ICH.  Heparin gtt ordered.  Labs completed and personally about interpreted by me show no anemia, no leukocytosis, no clinically significant electrolyte abnormalities, normal creatinine, normal troponin and BNP.  Given extensive DVT with extension and free-floating IVC thrombus, PE with concern for right heart strain on imaging, consulted pulmonology.  Plan to continue heparin drip, and have them evaluate/consider thrombolytics and have vascular consult.        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

## 2022-08-16 NOTE — Progress Notes (Signed)
ANTICOAGULATION CONSULT NOTE - Follow Up Consult  Pharmacy Consult for Heparin infusion Indication: DVT and PE   Allergies  Allergen Reactions   Penicillins Itching and Rash   Adhesive [Tape] Other (See Comments)    Band-Aid causes blisters   Paxlovid [Nirmatrelvir-Ritonavir]     Severe allergic reaction    Patient Measurements: Height: 5' 8.5" (174 cm) Weight: 97.7 kg (215 lb 6.2 oz) IBW/kg (Calculated) : 65.05 Heparin Dosing Weight: 86.2 kg  Vital Signs: Temp: 97.9 F (36.6 C) (06/30 2328) Temp Source: Oral (06/30 2328) BP: 118/60 (06/30 2300) Pulse Rate: 86 (06/30 2300)  Labs: Recent Labs    08/16/22 1505 08/16/22 1802 08/16/22 2020 08/16/22 2252  HGB 12.7 11.7*  --   --   HCT 37.7 34.6*  --   --   PLT 156 125*  --   --   HEPARINUNFRC  --   --   --  >1.10*  CREATININE 0.71  --   --   --   TROPONINIHS 6  --  7  --      Estimated Creatinine Clearance: 86.4 mL/min (by C-G formula based on SCr of 0.71 mg/dL).   Medical History: Past Medical History:  Diagnosis Date   Anal fissure    Arthritis    Biliary colic 5/96   ERCP w/spincterotomy   Condyloma    Deep vein thrombosis (DVT) of lower extremity (HCC)    Fatigue    loss of sleep   Generalized headaches    GERD (gastroesophageal reflux disease)    Hemorrhoid    Hiatal hernia    Migraines    Obesity    Phlebitis of leg    PONV (postoperative nausea and vomiting)    Primary osteoarthritis of both knees    Stress incontinence    Rachna Schonberger's disease     Medications:  Scheduled:   Assessment: 66 yo F presents with leg pain extending up to thigh and buttock for past week. Doppler found extensive occlusive DVT of LLE from L iliac vein to the level of the calf veins. Patient was not taking anticoagulation prior to presentation. Pharmacy consulted to dose heparin infusion for treatment of acute DVT.   Hgb 11.7, Plt 125 - stable No s/sx of bleeding noted   Heparin level supratherapeutic >1.10.  Confirmed with RN drawn correctly (heparin via PIV and anti-Xa drawn via peripheral stick on opposite side).   Goal of Therapy:  Heparin level 0.3-0.7 units/ml Monitor platelets by anticoagulation protocol: Yes   Plan:  Hold infusion x 1 hour.  Resume heparin infusion at lower rate of 1350u/hr (17.5 u/kg/hr to ~15.5 u/kg)  Check heparin level in 6 hours Monitor daily CBC, heparin levels, and for s/sx of bleeding F/u VVS recommendations in the AM.    Estill Batten, PharmD, BCCCP  Clinical Pharmacist 08/16/2022 11:44 PM   Please refer to [facility] for pharmacy phone number

## 2022-08-16 NOTE — Progress Notes (Signed)
08/16/2022  Accepted for threatened phlegmasia cerulea dolens.  VVS aware.  Also has small PE burden with borderline submassive physiology.  Myrla Halsted MD PCCM

## 2022-08-17 ENCOUNTER — Inpatient Hospital Stay (HOSPITAL_COMMUNITY): Payer: 59

## 2022-08-17 ENCOUNTER — Encounter (HOSPITAL_COMMUNITY)
Admission: EM | Disposition: A | Discharge status: Home / Self Care | Payer: Self-pay | Source: Home / Self Care | Attending: Internal Medicine

## 2022-08-17 DIAGNOSIS — I2609 Other pulmonary embolism with acute cor pulmonale: Secondary | ICD-10-CM | POA: Diagnosis not present

## 2022-08-17 DIAGNOSIS — I82422 Acute embolism and thrombosis of left iliac vein: Secondary | ICD-10-CM

## 2022-08-17 DIAGNOSIS — I2694 Multiple subsegmental pulmonary emboli without acute cor pulmonale: Secondary | ICD-10-CM

## 2022-08-17 LAB — CBC
HCT: 37.3 % (ref 36.0–46.0)
Hemoglobin: 12.3 g/dL (ref 12.0–15.0)
MCH: 31.1 pg (ref 26.0–34.0)
MCHC: 33 g/dL (ref 30.0–36.0)
MCV: 94.4 fL (ref 80.0–100.0)
Platelets: 145 10*3/uL — ABNORMAL LOW (ref 150–400)
RBC: 3.95 MIL/uL (ref 3.87–5.11)
RDW: 13.8 % (ref 11.5–15.5)
WBC: 8.1 10*3/uL (ref 4.0–10.5)
nRBC: 0 % (ref 0.0–0.2)

## 2022-08-17 LAB — ECHOCARDIOGRAM COMPLETE
AR max vel: 2.43 cm2
AV Peak grad: 11.3 mmHg
Ao pk vel: 1.68 m/s
Area-P 1/2: 3.6 cm2
Height: 68.5 in
S' Lateral: 2.9 cm
Weight: 3446.23 oz

## 2022-08-17 LAB — HEPARIN LEVEL (UNFRACTIONATED)
Heparin Unfractionated: 0.78 IU/mL — ABNORMAL HIGH (ref 0.30–0.70)
Heparin Unfractionated: 0.66 IU/mL (ref 0.30–0.70)

## 2022-08-17 LAB — BASIC METABOLIC PANEL
Anion gap: 9 (ref 5–15)
BUN: 7 mg/dL — ABNORMAL LOW (ref 8–23)
CO2: 25 mmol/L (ref 22–32)
Calcium: 8.6 mg/dL — ABNORMAL LOW (ref 8.9–10.3)
Chloride: 103 mmol/L (ref 98–111)
Creatinine, Ser: 0.61 mg/dL (ref 0.44–1.00)
GFR, Estimated: >60 mL/min (ref >60)
Glucose, Bld: 178 mg/dL — ABNORMAL HIGH (ref 70–99)
Potassium: 3.9 mmol/L (ref 3.5–5.1)
Sodium: 137 mmol/L (ref 135–145)

## 2022-08-17 LAB — HIV ANTIBODY (ROUTINE TESTING W REFLEX): HIV Screen 4th Generation wRfx: NONREACTIVE

## 2022-08-17 LAB — LACTIC ACID, PLASMA: Lactic Acid, Venous: 1.1 mmol/L (ref 0.5–1.9)

## 2022-08-17 SURGERY — PERIPHERAL VASCULAR THROMBECTOMY
Anesthesia: LOCAL

## 2022-08-17 MED ORDER — ACETAMINOPHEN 325 MG PO TABS
650.0000 mg | ORAL_TABLET | Freq: Four times a day (QID) | ORAL | Status: DC | PRN
  Administered 2022-08-17 – 2022-08-19 (×4): 650 mg via ORAL
  Filled 2022-08-17 (×4): qty 2

## 2022-08-17 MED ORDER — SODIUM CHLORIDE 0.9 % IV SOLN
INTRAVENOUS | Status: DC
  Administered 2022-08-18: 100 mL/h via INTRAVENOUS

## 2022-08-17 NOTE — Progress Notes (Signed)
ANTICOAGULATION CONSULT NOTE  Pharmacy Consult for Heparin Indication: DVT and PE   Allergies  Allergen Reactions   Penicillins Itching and Rash   Adhesive [Tape] Other (See Comments)    Band-Aid causes blisters   Paxlovid [Nirmatrelvir-Ritonavir]     Severe allergic reaction    Patient Measurements: Height: 5' 8.5" (174 cm) Weight: 97.7 kg (215 lb 6.2 oz) IBW/kg (Calculated) : 65.05 Heparin Dosing Weight: 86.2 kg  Vital Signs: Temp: 98.5 F (36.9 C) (07/01 0749) Temp Source: Oral (07/01 0749) BP: 129/65 (07/01 0700) Pulse Rate: 79 (07/01 0700)  Labs: Recent Labs    08/16/22 1505 08/16/22 1802 08/16/22 2020 08/16/22 2252 08/17/22 0826  HGB 12.7 11.7*  --   --   --   HCT 37.7 34.6*  --   --   --   PLT 156 125*  --   --   --   HEPARINUNFRC  --   --   --  >1.10* 0.78*  CREATININE 0.71  --   --   --   --   TROPONINIHS 6  --  7 8  --      Estimated Creatinine Clearance: 86.4 mL/min (by C-G formula based on SCr of 0.71 mg/dL).   Assessment: 67 yo F presents with leg pain extending up to thigh and buttock for past week. Doppler found extensive occlusive DVT of LLE from L iliac vein to the level of the calf veins. Also positive for PE.  Pharmacy consulted to dose heparin.  Heparin level supra-therapeutic at 0.78 units/mL on 1350 units/hr, but trending down.  CBC stable; no bleeding reported.  Goal of Therapy:  Heparin level 0.3-0.7 units/ml Monitor platelets by anticoagulation protocol: Yes   Plan:  Reduce heparin infusion to 1250 units/hr Check heparin level in 6 hours Monitor daily CBC, heparin levels, and for s/sx of bleeding  Junior Huezo D. Laney Potash, PharmD, BCPS, BCCCP 08/17/2022, 9:08 AM

## 2022-08-17 NOTE — Progress Notes (Signed)
Echocardiogram 2D Echocardiogram has been performed.  Kathryn Pineda 08/17/2022, 9:29 AM

## 2022-08-17 NOTE — Progress Notes (Signed)
eLink Physician-Brief Progress Note Patient Name: Kathryn Pineda DOB: [birth year] MRN: [id number]   Date of Service  08/17/2022  HPI/Events of Note  Notified lactic acid 2.4 No clinical signs of hypoperfusion  eICU Interventions  No changes in intervention Will repeat lactic in AM to ensure clearance or stability     Intervention Category Intermediate Interventions: Other:  Darl Pikes 08/17/2022, 12:11 AM

## 2022-08-17 NOTE — Progress Notes (Signed)
ANTICOAGULATION CONSULT NOTE  Pharmacy Consult for Heparin Indication: DVT and PE   Allergies  Allergen Reactions   Penicillins Itching and Rash   Adhesive [Tape] Other (See Comments)    Band-Aid causes blisters   Paxlovid [Nirmatrelvir-Ritonavir]     Severe allergic reaction    Patient Measurements: Height: 5' 8.5" (174 cm) Weight: 97.7 kg (215 lb 6.2 oz) IBW/kg (Calculated) : 65.05 Heparin Dosing Weight: 86.2 kg  Vital Signs: Temp: 98.4 F (36.9 C) (07/01 1234) Temp Source: Oral (07/01 1234) BP: 119/83 (07/01 1234) Pulse Rate: 85 (07/01 1234)  Labs: Recent Labs    08/16/22 1505 08/16/22 1802 08/16/22 2020 08/16/22 2252 08/17/22 0826 08/17/22 1536  HGB 12.7 11.7*  --   --  12.3  --   HCT 37.7 34.6*  --   --  37.3  --   PLT 156 125*  --   --  145*  --   HEPARINUNFRC  --   --   --  >1.10* 0.78* 0.66  CREATININE 0.71  --   --   --  0.61  --   TROPONINIHS 6  --  7 8  --   --      Estimated Creatinine Clearance: 86.4 mL/min (by C-G formula based on SCr of 0.61 mg/dL).   Assessment: 67 yo F presents with leg pain extending up to thigh and buttock for past week. Doppler found extensive occlusive DVT of LLE from L iliac vein to the level of the calf veins. Also positive for PE. Pharmacy consulted to dose heparin.  PM update - Heparin level now therapeutic but near top of range at 0.66 after rate decrease this morning. CBC stable. No issues with infusion per discussion with RN but does note some minimal oozing around IV site - not worsening since site dressing changed.  Goal of Therapy:  Heparin level 0.3-0.7 units/ml Monitor platelets by anticoagulation protocol: Yes   Plan:  Reduce heparin infusion slightly to 1200 units/hr to ensure stays in range Confirmatory heparin level with AM labs Monitor daily CBC, for s/sx of bleeding   Leia Alf, PharmD, BCPS Please check AMION for all [facility] contact numbers Clinical Pharmacist 08/17/2022 4:58 PM

## 2022-08-17 NOTE — H&P (Signed)
NAME:  Kathryn Pineda, MRN:  [id number], DOB:  October 25, 1955, LOS: 1 ADMISSION DATE:  08/16/2022, CONSULTATION DATE:  08/16/22 REFERRING MD:  Dalene Seltzer, CHIEF COMPLAINT:  leg pain   History of Present Illness:  67 year old with a history of right lower extremity DVT in 2020, completed 3 months of anticoag tx,  not currently on anticoagulation who  presented 6/30 with a week of worsening left leg pain.  Also admits to several episodes of transient shortness of breath and lightheadedness.  Workup in the [facility] emergency room reveals extensive left lower extremity DVT extending up to the iliac (and potentially IVC) as well as pulmonary emboli with a mildly increased RV to LV ratio.  VVS is aware of the patient and likely will intervene on her leg sometime this week.  Pulmonary critical care is consulted to admit to assure stability for this procedure.  Pertinent  Medical History  Wilson's disease which is probably a typo GERD HTN? 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 She remains on RA and does not complain of shortness of breath. For thrombectomy either today 7/1 or Wednesday 7/3  Objective   Blood pressure 129/65, pulse 79, temperature 98.5 F (36.9 C), temperature source Oral, resp. rate (!) 26, height 5' 8.5" (1.74 m), weight 97.7 kg, SpO2 98 %.        Intake/Output Summary (Last 24 hours) at 08/17/2022 0834 Last data filed at 08/17/2022 0700 Gross per 24 hour  Intake 728.77 ml  Output 600 ml  Net 128.77 ml   Filed Weights   08/16/22 1538 08/16/22 1940 08/17/22 0500  Weight: 93.4 kg 97.7 kg 97.7 kg    Examination: General: NAD, alert and appropriate HEENT: NCAT, MM  pink/moist Neuro: Aox3; MAE x 4 CV: s1s2, RRR, no m/r/g PULM:  Bilateral chest excursion, dim clear BS bilaterally; room air GI: soft, bsx4 active  Extremities: warm/dry, LLE with swelling, good pulses , brisk refill Skin: no rashes or lesions    Trop neg BNP neg  CTA reviewed and not very impressive clot burden nor RV strain  Resolved Hospital Problem list   N/A  Assessment & Plan:  Low risk submassive PE LLE DVT Threatened phlegmasia cerulea dolens Plan: - Would check echo to see if there is a central clot, if there is; would give consideration for systemic TPA - telemetry monitoring - Heparin gtt - Trend CBC - LLE neurovascular checks - bedrest - As this is the second PE would recommend lifelong AC - VVS consulted; plan is for thrombectomy of the popliteal approach, either today 7/1 or Wednesday 7/3 unless she needs it earlier  [employer] (right click and "Reselect all SmartList Selections" daily)   Diet/type: Regular consistency (see orders) DVT prophylaxis: systemic heparin GI prophylaxis: N/A Lines: N/A Foley:  N/A Code Status:  full code Last date of multidisciplinary goals of care discussion [6/30 updated patient and husband at bedside]  Labs   CBC: Recent Labs  Lab 08/16/22 1505 08/16/22 1802  WBC 8.3 7.7  NEUTROABS 6.3  --   HGB 12.7 11.7*  HCT 37.7 34.6*  MCV 94.0 94.8  PLT 156 125*     Basic Metabolic Panel: Recent Labs  Lab 08/16/22 1505  NA 137  K 3.6  CL 104  CO2 23  GLUCOSE 116*  BUN 16  CREATININE 0.71  CALCIUM 9.1    GFR: Estimated Creatinine Clearance: 86.4 mL/min (by C-G formula based on SCr of 0.71 mg/dL). Recent Labs  Lab 08/16/22 1505 08/16/22 1802 08/16/22 2252  WBC 8.3 7.7  --   LATICACIDVEN  --   --  2.4*     Liver Function Tests: Recent Labs  Lab 08/16/22 1505  AST 19  ALT 14  ALKPHOS 59  BILITOT 0.7  PROT 7.0  ALBUMIN 3.9    No results for input(s): "LIPASE", "AMYLASE" in the last 168  hours. No results for input(s): "AMMONIA" in the last 168 hours.  ABG No results found for: "PHART", "PCO2ART", "PO2ART", "HCO3", "TCO2", "ACIDBASEDEF", "O2SAT"   Coagulation Profile: No results for input(s): "INR", "PROTIME" in the last 168 hours.  Cardiac Enzymes: No results for input(s): "CKTOTAL", "CKMB", "CKMBINDEX", "TROPONINI" in the last 168 hours.  HbA1C: Hgb A1c MFr Bld  Date/Time Value Ref Range Status  03/23/2022 09:48 AM 5.7 4.6 - 6.5 % Final    Comment:    Glycemic Control Guidelines for People with Diabetes:Non Diabetic:  <6%Goal of Therapy: <7%Additional Action Suggested:  >8%   01/01/2021 11:53 AM 5.7 4.6 - 6.5 % Final    Comment:    Glycemic Control Guidelines for People with Diabetes:Non Diabetic:  <6%Goal of Therapy: <7%Additional Action Suggested:  >8%     CBG: Recent Labs  Lab 08/16/22 1935  GLUCAP 100*      Past Medical History:  She,  has a past medical history of Anal fissure, Arthritis, Biliary colic (5/96), Condyloma, Deep vein thrombosis (DVT) of lower extremity (HCC), Fatigue, Generalized headaches, GERD (gastroesophageal reflux disease), Hemorrhoid, Hiatal hernia, Migraines, Obesity, Phlebitis of leg, PONV (postoperative nausea and vomiting), Primary osteoarthritis of both knees, Stress incontinence, and Wilson's disease.   Surgical History:   Past Surgical History:  Procedure Laterality Date   ABDOMINAL HYSTERECTOMY     ANAL FISSURECTOMY  1986   ANTERIOR AND POSTERIOR REPAIR N/A 11/24/2016   Procedure: ANTERIOR (CYSTOCELE) AND POSTERIOR REPAIR (RECTOCELE);  Surgeon: Alfredo Martinez, MD;  Location: [facility];  Service: Urology;  Laterality: N/A;   BUNIONECTOMY Bilateral 2005   CESAREAN SECTION  1990   prolapsed cord   CYSTOSCOPY N/A 11/24/2016   Procedure: CYSTOSCOPY;  Surgeon: Alfredo Martinez, MD;  Location: [facility];  Service: Urology;  Laterality: N/A;   ENDOVENOUS ABLATION SAPHENOUS VEIN W/ LASER  2008   both lower legs   LAPAROSCOPIC  CHOLECYSTECTOMY  1997   LAPAROSCOPIC GASTRIC SLEEVE RESECTION WITH HIATAL HERNIA REPAIR  09/16/2010   LAPAROSCOPIC VAGINAL HYSTERECTOMY WITH SALPINGECTOMY Bilateral 11/24/2016   Procedure: LAPAROSCOPIC ASSISTED VAGINAL HYSTERECTOMY WITH SALPINGO-OOPHORECTOMY;  Surgeon: [name], MD;  Location: [facility];  Service: Gynecology;  Laterality: Bilateral;     Social History:   reports that she has never smoked. She has never used smokeless tobacco. She reports current alcohol use of about 1.0 standard drink of alcohol per week. She reports that she does not use drugs.   Family History:  Her family history includes Cancer in her maternal grandmother and maternal uncle; Colon polyps in her father; Diabetes in her father; Hypertension in her brother; Osteoarthritis in her mother.   Allergies Allergies  Allergen Reactions   Penicillins Itching and Rash   Adhesive [Tape] Other (See Comments)    Band-Aid causes blisters   Paxlovid [Nirmatrelvir-Ritonavir]  Severe allergic reaction     Home Medications  Prior to Admission medications   Medication Sig Start Date End Date Taking? Authorizing Provider  Cholecalciferol (VITAMIN D3) 125 MCG (5000 UT) CAPS Take by mouth.    [provider]  COVID-19 mRNA vaccine [phone] (COMIRNATY) syringe Inject into the muscle. 03/23/22   Judyann Munson, MD  Cyanocobalamin (B-12 PO) Take by mouth.    [provider]  EPINEPHrine (EPIPEN 2-PAK) 0.3 mg/0.3 mL IJ SOAJ injection Inject 0.3 mg into the muscle as needed for anaphylaxis. 07/20/22   Copland, [name], MD  loratadine (CLARITIN) 5 MG chewable tablet Chew 5 mg by mouth daily.    [provider]  Multiple Vitamin (MULTIVITAMIN) tablet Take 1 tablet by mouth daily.    [provider]  psyllium (METAMUCIL) 58.6 % packet Take 1 packet by mouth daily as needed.    [provider]     Critical care time: 30 minutes    Bevelyn Ngo, MSN, AGACNP-BC [place]  Pulmonary/Critical Care Medicine See Amion for personal pager PCCM on call pager [phone]  From 7A-7P if no response, please call [phone]. After hours, please call ELink [phone]. [facility] 08/17/2022, 8:34 AM

## 2022-08-17 NOTE — Progress Notes (Signed)
Patient brought to 4E from 3M. VSS. Telemetry box applied, CCMD notified. Patient oriented to room and staff. Call bell in reach.  Willie Loy L Son Barkan, RN  

## 2022-08-17 NOTE — Consult Note (Signed)
[facility] [place]  Patient name: Kathryn Pineda MRN: [id number] DOB: 1955-10-08 Sex: female   REQUESTING PROVIDER:    ER   REASON FOR CONSULT:    DVT  HISTORY OF PRESENT ILLNESS:   Kathryn Pineda is a 67 y.o. female, who presented to med [employer] [facility] with a 1 week history of left leg pain.  She states that her symptoms began approximately 1 week ago while she was [place] beach.  She initially had pain in her left leg which was a muscle cramp.  However she returnedhome with left leg pain as well as episodes of shortness of breath ultimately causing her to go to the emergency department yesterday.  Ultrasound showed extensive left leg DVT.  PET scan was positive for PE.  She does not have any active chest pain.  Patient has a history of a right leg DVT in 2020 fall.  She tells me she had a hypercoagulable workup that was unremarkable.  She completed 3 months of anticoagulation.  She also had a superficial thrombophlebitis that was treated with anticoagulation   PAST MEDICAL HISTORY    Past Medical History:  Diagnosis Date   Anal fissure    Arthritis    Biliary colic 5/96   ERCP w/spincterotomy   Condyloma    Deep vein thrombosis (DVT) of lower extremity (HCC)    Fatigue    loss of sleep   Generalized headaches    GERD (gastroesophageal reflux disease)    Hemorrhoid    Hiatal hernia    Migraines    Obesity    Phlebitis of leg    PONV (postoperative nausea and vomiting)    Primary osteoarthritis of both knees    Stress incontinence    Wilson's disease      FAMILY HISTORY   Family History  Problem Relation Age of Onset   Diabetes Father    Colon polyps Father    Osteoarthritis Mother    Hypertension Brother    Cancer Maternal Uncle        leukemia   Cancer Maternal Grandmother        unaware    SOCIAL HISTORY:   Social History   Socioeconomic History   Marital status: Married    Spouse name:  Not on file   Number of children: Not on file   Years of education: Not on file   Highest education level: Not on file  Occupational History   Not on file  Tobacco Use   Smoking status: Never   Smokeless tobacco: Never  Vaping Use   Vaping Use: Never used  Substance and Sexual Activity   Alcohol use: Yes    Alcohol/week: 1.0 standard drink of alcohol    Types: 1 Glasses of wine per week   Drug use: No   Sexual activity: Yes    Partners: Male    Birth control/protection: Other-see comments, Surgical    Comment: LAVH 11/24/16  Other Topics Concern   Not on file  Social History Narrative   Not on file   Social Determinants of Health   Financial Resource Strain: Not on file  Food Insecurity: Not on file  Transportation Needs: Not on file  Physical Activity: Not on file  Stress: Not on file  Social Connections: Not on file  Intimate Partner Violence: Not on file    ALLERGIES:    Allergies  Allergen Reactions   Penicillins Itching and Rash   Adhesive [Tape] Other (  See Comments)    Band-Aid causes blisters   Paxlovid [Nirmatrelvir-Ritonavir]     Severe allergic reaction    CURRENT MEDICATIONS:    Current Facility-Administered Medications  Medication Dose Route Frequency Provider Last Rate Last Admin   Chlorhexidine Gluconate Cloth 2 % PADS 6 each  6 each Topical Q0600 Lorin Glass, MD   6 each at 08/16/22 1936   docusate sodium (COLACE) capsule 100 mg  100 mg Oral BID PRN Omar Person, MD       heparin ADULT infusion 100 units/mL (25000 units/26mL)  1,350 Units/hr Intravenous Continuous Francena Hanly, RPH 13.5 mL/hr at 08/17/22 1610 1,350 Units/hr at 08/17/22 9604   loratadine (CLARITIN) tablet 5 mg  5 mg Oral Daily Omar Person, MD       Oral care mouth rinse  15 mL Mouth Rinse PRN Omar Person, MD       polyethylene glycol (MIRALAX / Ethelene Hal) packet 17 g  17 g Oral Daily PRN Omar Person, MD        REVIEW OF SYSTEMS:   [X]   denotes positive finding, [ ]  denotes negative finding Cardiac  Comments:  Chest pain or chest pressure:    Shortness of breath upon exertion:    Short of breath when lying flat:    Irregular heart rhythm:        Vascular    Pain in calf, thigh, or hip brought on by ambulation:    Pain in feet at night that wakes you up from your sleep:     Blood clot in your veins:    Leg swelling:  x       Pulmonary    Oxygen at home:    Productive cough:     Wheezing:         Neurologic    Sudden weakness in arms or legs:     Sudden numbness in arms or legs:     Sudden onset of difficulty speaking or slurred speech:    Temporary loss of vision in one eye:     Problems with dizziness:         Gastrointestinal    Blood in stool:      Vomited blood:         Genitourinary    Burning when urinating:     Blood in urine:        Psychiatric    Major depression:         Hematologic    Bleeding problems:    Problems with blood clotting too easily:        Skin    Rashes or ulcers:        Constitutional    Fever or chills:     PHYSICAL EXAM:   Vitals:   08/17/22 0343 08/17/22 0400 08/17/22 0500 08/17/22 0600  BP:  118/66 114/62 (!) 140/72  Pulse:  69 70 75  Resp:  15 17 16   Temp: 98.2 F (36.8 C)     TempSrc: Oral     SpO2:  95% 93% 93%  Weight:   97.7 kg   Height:        GENERAL: The patient is a well-nourished female, in no acute distress. The vital signs are documented above. CARDIAC: There is a regular rate and rhythm.  VASCULAR: Edematous left leg with palpable pedal pulses PULMONARY: Nonlabored respirations MUSCULOSKELETAL: There are no major deformities or cyanosis. NEUROLOGIC: No focal weakness or paresthesias are detected. SKIN: There  are no ulcers or rashes noted. PSYCHIATRIC: The patient has a normal affect.  STUDIES:   I have reviewed the following ultrasound: Extensive occlusive deep vein thrombosis of the left lower extremity, visualized most centrally  at the level of the left iliac vein and extending to the level of the calf veins. The IVC is not well visualized, however thrombus at this level is not excluded. Recommend further assessment with CT abdomen and pelvis venogram protocol to assess for possible central extension of thrombus.   CT: 1. Acute pulmonary embolism with moderate embolic burden and CT evidence of right heart strain as described above. (RV/LV Ratio = 1.21) consistent with at least submassive (intermediate risk) PE. The presence of right heart strain has been associated with an increased risk of morbidity and mortality. Please refer to the "Code PE Focused" order set in EPIC. 2. Acute occlusive DVT involving the left common femoral, external iliac, internal iliac, and common iliac veins. Small amount of free-floating thrombus extending into the confluence of the inferior vena cava. 3. Mild circumferential wall thickening involving the distal esophagus may reflect changes of esophagitis but is nonspecific. 4. Gastric lap band device in expected position.   Aortic Atherosclerosis (ICD10-I70.0). ASSESSMENT and PLAN   Left leg iliac-femoral DVT: I discussed with the patient that the standard of care is anticoagulation and leg elevation.  We also discussed that with iliofemoral DVT, clot removal is associated with a lower anastomosis of the postphlebitic syndrome.  Because of her current symptoms leg pain and swelling, we have decided to proceed thrombectomy of the popliteal approach.  I discussed with her that due to scheduling, this may not cover until Wednesday, however since she is in the hospital we can add her on any day.  All of the questions were answered and she wants to proceed   Durene Cal, IV, MD, FACS [facility] [phone] Pager [phone]

## 2022-08-17 NOTE — TOC Initial Note (Signed)
Transition of Care [facility]) - Initial/Assessment Note    Patient Details  Name: Kathryn Pineda MRN: [id number] Date of Birth: 14-Sep-1955  Transition of [facility]) CM/SW Contact:    Tom-Johnson, Hershal Coria, RN Phone Number: 08/17/2022, 12:42 PM  Clinical Narrative:                  Patient presented with Left leg pain and found to have Pulmonary Embolism. Patient has hx of Rt DVT. Currently on Heparin gtt. Vascular Sx following. Plan for Thrombectomy on Wednesday 08/19/22.    Patient is from home with husband ad son, has three supportive children. 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 Requested to speak with Financial Navigator about Affordable Care Act benefit. CM sent request.  PCP is Copland, [name], MD and uses [facility] in [place].   CM will continue to follow as patient progresses with care towards discharge.          Barriers to Discharge: Continued Medical Work up   Patient Goals and CMS Choice Patient states their goals for this hospitalization and ongoing recovery are:: To return home CMS Medicare.gov Compare Post Acute Care list provided to:: Patient Choice offered to / list presented to : NA      Expected Discharge Plan and Services In-house Referral: Financial Counselor Discharge Planning Services: CM Consult   Living arrangements for the past 2 months: Single Family Home                                      Prior Living Arrangements/Services Living arrangements for the past 2 months: Single Family Home Lives with:: Spouse, Adult Children (Son) Patient language and need for interpreter reviewed:: Yes Do you feel safe going back to the place where you live?: Yes      Need for Family Participation in Patient Care: Yes (Comment) Care giver support system in place?: Yes (comment)   Criminal Activity/Legal Involvement Pertinent to Current Situation/Hospitalization: No -  Comment as needed  Activities of Daily Living      Permission Sought/Granted Permission sought to share information with : Case Manager, Family Supports Permission granted to share information with : Yes, Verbal Permission Granted              Emotional Assessment Appearance:: Appears stated age Attitude/Demeanor/Rapport: Engaged, Gracious Affect (typically observed): Accepting, Appropriate, Calm, Hopeful, Pleasant Orientation: : Oriented to Self, Oriented to Place, Oriented to  Time, Oriented to Situation Alcohol / Substance Use: Not Applicable Psych Involvement: No (comment)  Admission diagnosis:  Pulmonary emboli (HCC) [I26.99] 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) [I80.202] 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   Acute deep vein thrombosis (DVT) of femoral vein of left lower extremity (HCC) 08/16/2022   Internal hemorrhoids 07/24/2022   Thrombophlebitis of superficial veins of lower extremity 07/24/2022   History of DVT (deep vein thrombosis) 07/24/2022   Viral URI 04/10/2022   Personal history of colonic polyps 07/15/2021   Rectocele 11/13/2016   Incomplete uterine prolapse 11/13/2016   Cystocele, midline 11/13/2016   Seasonal allergies 09/17/2014   Incontinence    Obesity (BMI 30-39.9) 12/24/2010   Hx of laparoscopic gastric banding 09/23/2010   PCP:  Pearline Cables, MD Pharmacy:  [facility] [address] [place] [address] Phone: [phone] Fax: [phone]  [facility] [address] [place] [address] Phone: [phone] Fax: [phone]     Social Determinants of Health (SDOH) Social History: SDOH Screenings   Depression [phone]): Low Risk  (07/24/2022)  Tobacco Use: Low Risk  (08/16/2022)    SDOH Interventions: Transportation Interventions: Intervention Not Indicated, Inpatient TOC, Patient Resources (Friends/Family)   Readmission Risk Interventions    08/17/2022   12:40 PM  Readmission Risk Prevention Plan  Post Dischage Appt Complete  Medication Screening Complete  Transportation Screening Complete

## 2022-08-18 ENCOUNTER — Other Ambulatory Visit (HOSPITAL_COMMUNITY): Payer: Self-pay

## 2022-08-18 DIAGNOSIS — I80202 Phlebitis and thrombophlebitis of unspecified deep vessels of left lower extremity: Secondary | ICD-10-CM | POA: Diagnosis not present

## 2022-08-18 DIAGNOSIS — I2699 Other pulmonary embolism without acute cor pulmonale: Secondary | ICD-10-CM | POA: Diagnosis not present

## 2022-08-18 LAB — BASIC METABOLIC PANEL
Anion gap: 6 (ref 5–15)
BUN: 6 mg/dL — ABNORMAL LOW (ref 8–23)
CO2: 25 mmol/L (ref 22–32)
Calcium: 8.4 mg/dL — ABNORMAL LOW (ref 8.9–10.3)
Chloride: 108 mmol/L (ref 98–111)
Creatinine, Ser: 0.59 mg/dL (ref 0.44–1.00)
GFR, Estimated: >60 mL/min (ref >60)
Glucose, Bld: 113 mg/dL — ABNORMAL HIGH (ref 70–99)
Potassium: 3.6 mmol/L (ref 3.5–5.1)
Sodium: 139 mmol/L (ref 135–145)

## 2022-08-18 LAB — CBC WITH DIFFERENTIAL/PLATELET
Abs Immature Granulocytes: 0.03 10*3/uL (ref 0.00–0.07)
Basophils Absolute: 0 10*3/uL (ref 0.0–0.1)
Basophils Relative: 1 %
Eosinophils Absolute: 0.1 10*3/uL (ref 0.0–0.5)
Eosinophils Relative: 2 %
HCT: 36.4 % (ref 36.0–46.0)
Hemoglobin: 11.9 g/dL — ABNORMAL LOW (ref 12.0–15.0)
Immature Granulocytes: 0 %
Lymphocytes Relative: 17 %
Lymphs Abs: 1.2 10*3/uL (ref 0.7–4.0)
MCH: 31.1 pg (ref 26.0–34.0)
MCHC: 32.7 g/dL (ref 30.0–36.0)
MCV: 95 fL (ref 80.0–100.0)
Monocytes Absolute: 0.8 10*3/uL (ref 0.1–1.0)
Monocytes Relative: 12 %
Neutro Abs: 5 10*3/uL (ref 1.7–7.7)
Neutrophils Relative %: 68 %
Platelets: 157 10*3/uL (ref 150–400)
RBC: 3.83 MIL/uL — ABNORMAL LOW (ref 3.87–5.11)
RDW: 13.5 % (ref 11.5–15.5)
WBC: 7.3 10*3/uL (ref 4.0–10.5)
nRBC: 0 % (ref 0.0–0.2)

## 2022-08-18 LAB — HEPARIN LEVEL (UNFRACTIONATED): Heparin Unfractionated: 0.51 IU/mL (ref 0.30–0.70)

## 2022-08-18 LAB — ANTITHROMBIN III: AntiThromb III Func: 86 % (ref 75–120)

## 2022-08-18 NOTE — Progress Notes (Signed)
Patient has decided that she does not want to get the head CT.  It was ordered due to the patient complaining of intermittent streaks of light in side vision.

## 2022-08-18 NOTE — Progress Notes (Signed)
ANTICOAGULATION CONSULT NOTE  Pharmacy Consult for Heparin Indication: DVT and PE   Allergies  Allergen Reactions   Penicillins Itching and Rash   Adhesive [Tape] Other (See Comments)    Band-Aid causes blisters   Paxlovid [Nirmatrelvir-Ritonavir]     Severe allergic reaction    Patient Measurements: Height: 5' 8.5" (174 cm) Weight: 97.9 kg (215 lb 13.3 oz) IBW/kg (Calculated) : 65.05 Heparin Dosing Weight: 86.2 kg  Vital Signs: Temp: 98.2 F (36.8 C) (07/02 0805) Temp Source: Oral (07/02 0805) BP: 120/66 (07/02 0805) Pulse Rate: 78 (07/02 0805)  Labs: Recent Labs    08/16/22 1505 08/16/22 1802 08/16/22 2020 08/16/22 2252 08/16/22 2252 08/17/22 0826 08/17/22 1536 08/18/22 0123  HGB 12.7 11.7*  --   --   --  12.3  --  11.9*  HCT 37.7 34.6*  --   --   --  37.3  --  36.4  PLT 156 125*  --   --   --  145*  --  157  HEPARINUNFRC  --   --   --  >1.10*   < > 0.78* 0.66 0.51  CREATININE 0.71  --   --   --   --  0.61  --  0.59  TROPONINIHS 6  --  7 8  --   --   --   --    < > = values in this interval not displayed.    Estimated Creatinine Clearance: 86.5 mL/min (by C-G formula based on SCr of 0.59 mg/dL).   Assessment: 67 yo F presents with extensive occlusive DVT of LLE from L iliac vein to the level of the calf veins and moderate submassive  PE with RHS.  Pharmacy consulted to dose heparin.  Heparin level 0.51 is therapeutic on 1200units/hr.   Goal of Therapy:  Heparin level 0.3-0.7 units/ml Monitor platelets by anticoagulation protocol: Yes   Plan:  Continue heparin 1250 units/hr Monitor daily CBC, heparin levels, and for s/sx of bleeding  Alphia Moh, PharmD, BCPS, BCCP Clinical Pharmacist  Please check AMION for all [facility] Pharmacy phone numbers After 10:00 PM, call Main Pharmacy [phone]

## 2022-08-18 NOTE — Progress Notes (Signed)
PROGRESS NOTE    Kathryn Pineda  ZOX:[id number] DOB: Dec 23, 1955 DOA: 08/16/2022 PCP: Pearline Cables, MD   Brief Narrative:  HPI per PCCM Kandice Robinsons The patient is a 67 year old with a history of right lower extremity DVT in 2020, completed 3 months of anticoag tx,  not currently on anticoagulation who  presented 6/30 with a week of worsening left leg pain.  Also admits to several episodes of transient shortness of breath and lightheadedness.  Workup in the [facility] emergency room reveals extensive left lower extremity DVT extending up to the iliac (and potentially IVC) as well as pulmonary emboli with a mildly increased RV to LV ratio.  VVS is aware of the patient and likely will intervene on her leg sometime this week.  Pulmonary critical care is consulted to admit to assure stability for this procedure.   **Interim History Patient was transferred to the [facility] service on 08/18/2022.  Today she had some mild vision disturbances with a headache and given that she is on a heparin drip we will order a head CT.  Going for surgical intervention for mechanical thrombectomy with Dr. 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 (RV/LV Ratio = 1.21) consistent with at least submassive (intermediate risk) PE. The presence of right heart strain has been associated with an increased risk of morbidity and mortality. Please refer to the "Code PE Focused" order set in EPIC. Acute occlusive DVT involving the left common femoral, external iliac, internal iliac, and common iliac veins. Small amount of free-floating  thrombus extending into the confluence of the inferior vena cava. Mild circumferential wall thickening involving the distal esophagus may reflect changes of esophagitis but is nonspecific. Gastric lap band device in expected position." -ECHO done and showed "Left ventricular ejection fraction, by estimation, is 60 to 65%. The left ventricle has normal function. The left ventricle has no regional  wall motion abnormalities. Left ventricular diastolic parameters are consistent with Grade I diastolic  dysfunction (impaired relaxation).Right ventricular systolic function is normal. The right ventricular  size is normal. There is normal pulmonary artery systolic pressure. The estimated right ventricular systolic pressure is 23.4 mmHg. The mitral valve is grossly normal. Trivial mitral valve regurgitation. The aortic valve is tricuspid. Aortic valve regurgitation is not visualized. Aortic valve sclerosis is present, with no evidence of aortic valve stenosis. The inferior vena cava is normal in size with greater than 50% respiratory variability, suggesting right atrial pressure of 3 mmHg. Comparison(s): No prior Echocardiogram. No evidence for RV strain." -C/w Telemetry monitoring -Heparin gtt and likely transition to DOAC but ? If too prohibitively expensive  -Hypercoaguable Panel sent by PCCM -Trend CBC -LLE neurovascular checks -C/w Bedrest -As this is the second PE would recommend lifelong [facility] -VVS consulted; plan is for thrombectomy of the popliteal approach on Wednesday 7/3 unless she needs it earlier  Transient Vision change associated with Headache -Patient reported to the nurse multiple times in last few hours that she she's had a very thin shooting light come and go in her side vision but no vision loss. Having a frontal headache as well  -Likely a Migraine  but will obtain Head CT w/o Contrast given patient is on systemic Anticoagulation -Head CT ordered and pending  Hyperglycemia -Glucose and  CBG Trend: Recent Labs  Lab 08/16/22 1505 08/16/22 1935 08/17/22 0826 08/18/22 0123  GLUCOSE 116*  --  178* 113*  GLUCAP  --  100*  --   --    Lactic Acidosis -LA Trend: Recent Labs  Lab 08/16/22 2252 08/17/22 0826  LATICACIDVEN 2.4* 1.1  -Improved and will not continue to Trend  Normocytic Anemia -Hgb/Hct Trend: Recent Labs  Lab 08/16/22 1505 08/16/22 1802 08/17/22 0826 08/18/22 0123  HGB 12.7 11.7* 12.3 11.9*  HCT 37.7 34.6* 37.3 36.4  MCV 94.0 94.8 94.4 95.0  -Check Anemia Panel in the AM -Continue to Monitor for S/Sx of Bleeding; no overt bleeding noted -Repeat CBC in the AM  Thrombocytopenia -Platelet Count Trend: Recent Labs  Lab 08/16/22 1505 08/16/22 1802 08/17/22 0826 08/18/22 0123  PLT 156 125* 145* 157  -Continue to Monitor for S/Sx of Bleeding; No overt bleeding noted -Repeat CBC in the AM  Obesity -Complicates overall prognosis and care -Estimated body mass index is 32.34 kg/m as calculated from the following:   Height as of this encounter: 5' 8.5" (1.74 m).   Weight as of this encounter: 97.9 kg.  -Weight Loss and Dietary Counseling given   DVT prophylaxis: Anticoagulated Heparin gtt    Code Status: Full Code Family Communication: Discussed with Family present at bedside  Disposition Plan:  Level of care: Telemetry Medical Status is: Inpatient Remains inpatient appropriate because: Going to the OR tomorrow   Consultants:  PCCM Transfer VVS  Procedures:  ECHOCARDIOGRAM IMPRESSIONS    1. Left ventricular ejection fraction, by estimation, is 60 to 65%. The  left ventricle has normal function. The left ventricle has no regional  wall motion abnormalities. Left ventricular diastolic parameters are  consistent with Grade I diastolic  dysfunction (impaired relaxation).   2. Right ventricular systolic function is normal. The right ventricular  size is normal. There is normal pulmonary artery systolic pressure. The  estimated right  ventricular systolic pressure is 23.4 mmHg.   3. The mitral valve is grossly normal. Trivial mitral valve  regurgitation.   4. The aortic valve is tricuspid. Aortic valve regurgitation is not  visualized. Aortic valve sclerosis is present, with no evidence of aortic  valve stenosis.   5. The inferior vena cava is normal in size with greater than 50%  respiratory variability, suggesting right atrial pressure of 3 mmHg.   Comparison(s): No prior Echocardiogram. No evidence for RV strain.   FINDINGS   Left Ventricle: Left ventricular ejection fraction, by estimation, is 60  to 65%. The left ventricle has normal function. The left ventricle has no  regional wall motion abnormalities. The left ventricular internal cavity  size was normal in size. There is   no left ventricular hypertrophy. Left ventricular diastolic parameters  are consistent with Grade I diastolic dysfunction (impaired relaxation).  Normal left ventricular filling pressure.   Right Ventricle: The right ventricular size is normal. No increase in  right ventricular wall thickness. Right ventricular systolic function is  normal. There is normal pulmonary artery systolic pressure. The tricuspid  regurgitant velocity is 2.26 m/s, and   with an assumed right atrial pressure of 3 mmHg, the estimated right  ventricular systolic pressure is 23.4 mmHg.   Left Atrium: Left atrial size was normal in size.   Right Atrium: Right atrial size was normal in size.   Pericardium:  There is no evidence of pericardial effusion.   Mitral Valve: The mitral valve is grossly normal. Trivial mitral valve  regurgitation.   Tricuspid Valve: The tricuspid valve is grossly normal. Tricuspid valve  regurgitation is trivial.   Aortic Valve: The aortic valve is tricuspid. Aortic valve regurgitation is  not visualized. Aortic valve sclerosis is present, with no evidence of  aortic valve stenosis. Aortic valve peak gradient measures 11.3 mmHg.    Pulmonic Valve: The pulmonic valve was normal in structure. Pulmonic valve  regurgitation is not visualized.   Aorta: The aortic root and ascending aorta are structurally normal, with  no evidence of dilitation.   Venous: The inferior vena cava is normal in size with greater than 50%  respiratory variability, suggesting right atrial pressure of 3 mmHg.   IAS/Shunts: No atrial level shunt detected by color flow Doppler.     LEFT VENTRICLE  PLAX 2D  LVIDd:         4.10 cm   Diastology  LVIDs:         2.90 cm   LV e' medial:    9.01 cm/s  LV PW:         1.00 cm   LV E/e' medial:  7.4  LV IVS:        1.00 cm   LV e' lateral:   10.90 cm/s  LVOT diam:     2.20 cm   LV E/e' lateral: 6.1  LV SV:         75  LV SV Index:   35  LVOT Area:     3.80 cm     RIGHT VENTRICLE             IVC  RV S prime:     19.80 cm/s  IVC diam: 1.30 cm  TAPSE (M-mode): 2.2 cm   LEFT ATRIUM             Index        RIGHT ATRIUM           Index  LA diam:        3.80 cm 1.79 cm/m   RA Area:     13.50 cm  LA Vol (A2C):   57.8 ml 27.26 ml/m  RA Volume:   29.60 ml  13.96 ml/m  LA Vol (A4C):   53.0 ml 24.97 ml/m  LA Biplane Vol: 58.7 ml 27.68 ml/m   AORTIC VALVE  AV Area (Vmax): 2.43 cm  AV Vmax:        168.00 cm/s  AV Peak Grad:   11.3 mmHg  LVOT Vmax:      107.33 cm/s  LVOT Vmean:     68.333 cm/s  LVOT VTI:       0.198 m    AORTA  Ao Root diam: 3.20 cm  Ao Asc diam:  3.30 cm   MITRAL VALVE               TRICUSPID VALVE  MV Area (PHT): 3.60 cm    TR Peak grad:   20.4 mmHg  MV Decel Time: 211 msec    TR Vmax:        226.00 cm/s  MV E velocity: 66.90 cm/s  MV A velocity: 95.60 cm/s  SHUNTS  MV E/A ratio:  0.70        Systemic VTI:  0.20 m  Systemic Diam: 2.20 cm   Antimicrobials:  Anti-infectives (From admission, onward)    None       Subjective: And examined at bedside and she was doing okay.  Denies any nausea or vomiting.  No chest pain or shortness of  breath but feels tired.  Going to the OR tomorrow.  Subsequently after I left the room she complained of some slight vision disturbances that were short-lived but no loss of vision.  Patient had no other concerns or complaints at this time.  Objective: Vitals:   08/18/22 1303 08/18/22 1538 08/18/22 1800 08/18/22 1941  BP: (!) 127/58 130/66  133/63  Pulse: 83 71 73 72  Resp: 15 19 15 15   Temp: 98.2 F (36.8 C) 98.3 F (36.8 C)  97.6 F (36.4 C)  TempSrc: Oral Oral  Oral  SpO2: 100% 97% 98% 97%  Weight:      Height:        Intake/Output Summary (Last 24 hours) at 08/18/2022 2047 Last data filed at 08/18/2022 1500 Gross per 24 hour  Intake 2199 ml  Output 2500 ml  Net -301 ml   Filed Weights   08/16/22 1940 08/17/22 0500 08/18/22 0449  Weight: 97.7 kg 97.7 kg 97.9 kg   Examination: Physical Exam:  Constitutional: WN/WD obese Caucasian female in NAD Respiratory: Diminished to auscultation bilaterally, no wheezing, rales, rhonchi or crackles. Normal respiratory effort and patient is not tachypenic. No accessory muscle use. Unlabored Breathing  Cardiovascular: RRR, no murmurs / rubs / gallops. S1 and S2 auscultated. Left Leg Extremity Swelling Abdomen: Soft, non-tender, Distended 2/2 body habitus. Bowel sounds positive.  GU: Deferred. Musculoskeletal: No clubbing / cyanosis of digits/nails. No joint deformity upper and lower extremities. Neurologic: CN 2-12 grossly intact with no focal deficits.  Romberg sign and cerebellar reflexes not assessed.  Psychiatric: Normal judgment and insight. Alert and oriented x 3. Normal mood and appropriate affect.   Data Reviewed: I have personally reviewed following labs and imaging studies  CBC: Recent Labs  Lab 08/16/22 1505 08/16/22 1802 08/17/22 0826 08/18/22 0123  WBC 8.3 7.7 8.1 7.3  NEUTROABS 6.3  --   --  5.0  HGB 12.7 11.7* 12.3 11.9*  HCT 37.7 34.6* 37.3 36.4  MCV 94.0 94.8 94.4 95.0  PLT 156 125* 145* 157   Basic Metabolic  Panel: Recent Labs  Lab 08/16/22 1505 08/17/22 0826 08/18/22 0123  NA 137 137 139  K 3.6 3.9 3.6  CL 104 103 108  CO2 23 25 25   GLUCOSE 116* 178* 113*  BUN 16 7* 6*  CREATININE 0.71 0.61 0.59  CALCIUM 9.1 8.6* 8.4*   GFR: Estimated Creatinine Clearance: 86.5 mL/min (by C-G formula based on SCr of 0.59 mg/dL). Liver Function Tests: Recent Labs  Lab 08/16/22 1505  AST 19  ALT 14  ALKPHOS 59  BILITOT 0.7  PROT 7.0  ALBUMIN 3.9   No results for input(s): "LIPASE", "AMYLASE" in the last 168 hours. No results for input(s): "AMMONIA" in the last 168 hours. Coagulation Profile: No results for input(s): "INR", "PROTIME" in the last 168 hours. Cardiac Enzymes: No results for input(s): "CKTOTAL", "CKMB", "CKMBINDEX", "TROPONINI" in the last 168 hours. BNP (last 3 results) No results for input(s): "PROBNP" in the last 8760 hours. HbA1C: No results for input(s): "HGBA1C" in the last 72 hours. CBG: Recent Labs  Lab 08/16/22 1935  GLUCAP 100*   Lipid Profile: No results for input(s): "CHOL", "HDL", "LDLCALC", "TRIG", "CHOLHDL", "LDLDIRECT" in the last 72 hours.  Thyroid Function Tests: No results for input(s): "TSH", "T4TOTAL", "FREET4", "T3FREE", "THYROIDAB" in the last 72 hours. Anemia Panel: No results for input(s): "VITAMINB12", "FOLATE", "FERRITIN", "TIBC", "IRON", "RETICCTPCT" in the last 72 hours. Sepsis Labs: Recent Labs  Lab 08/16/22 2252 08/17/22 0826  LATICACIDVEN 2.4* 1.1   Recent Results (from the past 240 hour(s))  MRSA Next Gen by PCR, Nasal     Status: None   Collection Time: 08/16/22  7:56 PM   Specimen: Nasal Mucosa; Nasal Swab  Result Value Ref Range Status   MRSA by PCR Next Gen NOT DETECTED NOT DETECTED Final    Comment: (NOTE) The GeneXpert MRSA Assay (FDA approved for NASAL specimens only), is one component of a comprehensive MRSA colonization surveillance program. It is not intended to diagnose MRSA infection nor to guide or monitor treatment  for MRSA infections. Test performance is not FDA approved in patients less than 12 years old. Performed at [facility], [address], [place], [place] [address]     Radiology Studies: ECHOCARDIOGRAM COMPLETE  Result Date: 08/17/2022    ECHOCARDIOGRAM REPORT   Patient Name:   Kathryn Pineda Date of Exam: 08/17/2022 Medical Rec #:  [id number]     Height:       68.5 in Accession #:    [id number]    Weight:       215.4 lb Date of Birth:  Jan 20, 1956     BSA:          2.121 m Patient Age:    67 years      BP:           129/65 mmHg Patient Gender: F             HR:           77 bpm. Exam Location:  Inpatient Procedure: 2D Echo, Cardiac Doppler and Color Doppler Indications:    Pulmonary Embolus I26.09  History:        Patient has no prior history of Echocardiogram examinations.  Sonographer:    Lucendia Herrlich Referring Phys: [id number] Jonny Ruiz D PAYNE IMPRESSIONS  1. Left ventricular ejection fraction, by estimation, is 60 to 65%. The left ventricle has normal function. The left ventricle has no regional wall motion abnormalities. Left ventricular diastolic parameters are consistent with Grade I diastolic dysfunction (impaired relaxation).  2. Right ventricular systolic function is normal. The right ventricular size is normal. There is normal pulmonary artery systolic pressure. The estimated right ventricular systolic pressure is 23.4 mmHg.  3. The mitral valve is grossly normal. Trivial mitral valve regurgitation.  4. The aortic valve is tricuspid. Aortic valve regurgitation is not visualized. Aortic valve sclerosis is present, with no evidence of aortic valve stenosis.  5. The inferior vena cava is normal in size with greater than 50% respiratory variability, suggesting right atrial pressure of 3 mmHg. Comparison(s): No prior Echocardiogram. No evidence for RV strain. FINDINGS  Left Ventricle: Left ventricular ejection fraction, by estimation, is 60 to 65%. The left ventricle has normal function. The left  ventricle has no regional wall motion abnormalities. The left ventricular internal cavity size was normal in size. There is  no left ventricular hypertrophy. Left ventricular diastolic parameters are consistent with Grade I diastolic dysfunction (impaired relaxation). Normal left ventricular filling pressure. Right Ventricle: The right ventricular size is normal. No increase in right ventricular wall thickness. Right ventricular systolic function is normal. There is normal pulmonary artery systolic pressure. The tricuspid regurgitant velocity is 2.26  m/s, and  with an assumed right atrial pressure of 3 mmHg, the estimated right ventricular systolic pressure is 23.4 mmHg. Left Atrium: Left atrial size was normal in size. Right Atrium: Right atrial size was normal in size. Pericardium: There is no evidence of pericardial effusion. Mitral Valve: The mitral valve is grossly normal. Trivial mitral valve regurgitation. Tricuspid Valve: The tricuspid valve is grossly normal. Tricuspid valve regurgitation is trivial. Aortic Valve: The aortic valve is tricuspid. Aortic valve regurgitation is not visualized. Aortic valve sclerosis is present, with no evidence of aortic valve stenosis. Aortic valve peak gradient measures 11.3 mmHg. Pulmonic Valve: The pulmonic valve was normal in structure. Pulmonic valve regurgitation is not visualized. Aorta: The aortic root and ascending aorta are structurally normal, with no evidence of dilitation. Venous: The inferior vena cava is normal in size with greater than 50% respiratory variability, suggesting right atrial pressure of 3 mmHg. IAS/Shunts: No atrial level shunt detected by color flow Doppler.  LEFT VENTRICLE PLAX 2D LVIDd:         4.10 cm   Diastology LVIDs:         2.90 cm   LV e' medial:    9.01 cm/s LV PW:         1.00 cm   LV E/e' medial:  7.4 LV IVS:        1.00 cm   LV e' lateral:   10.90 cm/s LVOT diam:     2.20 cm   LV E/e' lateral: 6.1 LV SV:         75 LV SV Index:   35  LVOT Area:     3.80 cm  RIGHT VENTRICLE             IVC RV S prime:     19.80 cm/s  IVC diam: 1.30 cm TAPSE (M-mode): 2.2 cm LEFT ATRIUM             Index        RIGHT ATRIUM           Index LA diam:        3.80 cm 1.79 cm/m   RA Area:     13.50 cm LA Vol (A2C):   57.8 ml 27.26 ml/m  RA Volume:   29.60 ml  13.96 ml/m LA Vol (A4C):   53.0 ml 24.97 ml/m LA Biplane Vol: 58.7 ml 27.68 ml/m  AORTIC VALVE AV Area (Vmax): 2.43 cm AV Vmax:        168.00 cm/s AV Peak Grad:   11.3 mmHg LVOT Vmax:      107.33 cm/s LVOT Vmean:     68.333 cm/s LVOT VTI:       0.198 m  AORTA Ao Root diam: 3.20 cm Ao Asc diam:  3.30 cm MITRAL VALVE               TRICUSPID VALVE MV Area (PHT): 3.60 cm    TR Peak grad:   20.4 mmHg MV Decel Time: 211 msec    TR Vmax:        226.00 cm/s MV E velocity: 66.90 cm/s MV A velocity: 95.60 cm/s  SHUNTS MV E/A ratio:  0.70        Systemic VTI:  0.20 m                            Systemic Diam: 2.20 cm Zoila Shutter MD Electronically signed by Zoila Shutter MD Signature  Date/Time: 08/17/2022/10:42:32 AM    Final     Scheduled Meds:  Chlorhexidine Gluconate Cloth  6 each Topical Q0600   loratadine  5 mg Oral Daily   Continuous Infusions:  heparin 1,200 Units/hr (08/18/22 2045)    LOS: 2 days   Marguerita Merles, DO [facility] Available via Epic secure chat 7am-7pm After these hours, please refer to coverage provider listed on [email or web address] 08/18/2022, 8:47 PM

## 2022-08-18 NOTE — Progress Notes (Addendum)
  Progress Note    08/18/2022 7:13 AM * No surgery date entered *  Left lower extremity iliac femoral DVT She is scheduled for mechanical thrombectomy with Dr. Karin Lieu tomorrow in OR Reviewed surgery with her and answered her questions NPO after midnight Consent order placed  Kathryn Pineda [facility] [phone] 08/18/2022 7:13 AM    Kathryn Pineda feels that her left leg is feeling better and less tight.  She has not been out of bed much.  Her family is with her at the bedside.  We discussed proceeding with venous thrombectomy tomorrow.  She may require stenting depending on findings during the procedure.  She remains on heparin drip which is not to be discontinued for her procedure.  She will be n.p.o. after midnight  [name]

## 2022-08-18 NOTE — Hospital Course (Addendum)
HPI per PCCM Kandice Robinsons The patient is a 67 year old with a history of right lower extremity DVT in 2020, completed 3 months of anticoag tx,  not currently on anticoagulation who  presented 6/30 with a week of worsening left leg pain.  Also admits to several episodes of transient shortness of breath and lightheadedness.  Workup in the [facility] emergency room reveals extensive left lower extremity DVT extending up to the iliac (and potentially IVC) as well as pulmonary emboli with a mildly increased RV to LV ratio.  VVS is aware of the patient and likely will intervene on her leg sometime this week.  Pulmonary critical care is consulted to admit to assure stability for this procedure.   **Interim History Patient was transferred to the [facility] service on 08/18/2022.  Today she had some mild vision disturbances with a headache and given that she is on a heparin drip we will order a head CT.  Going for surgical intervention for mechanical thrombectomy with Dr. 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 (RV/LV Ratio = 1.21) consistent with at least submassive (intermediate risk) PE. The presence of right heart strain has been associated with an increased risk of morbidity and mortality. Please refer to the "Code PE Focused" order set in EPIC. Acute occlusive DVT involving the left common femoral, external iliac, internal iliac, and common iliac veins. Small amount of free-floating thrombus extending into the confluence of the inferior vena cava. Mild circumferential wall thickening involving the distal esophagus  may reflect changes of esophagitis but is nonspecific. Gastric lap band device in expected position." -ECHO done and showed "Left ventricular ejection fraction, by estimation, is 60 to 65%. The left ventricle has normal function. The left ventricle has no regional  wall motion abnormalities. Left ventricular diastolic parameters are consistent with Grade I diastolic  dysfunction (impaired relaxation).Right ventricular systolic function is normal. The right ventricular  size is normal. There is normal pulmonary artery systolic pressure. The estimated right ventricular systolic pressure is 23.4 mmHg. The mitral valve is grossly normal. Trivial mitral valve regurgitation. The aortic valve is tricuspid. Aortic valve regurgitation is not visualized. Aortic valve sclerosis is present, with no evidence of aortic valve stenosis. The inferior vena cava is normal in size with greater than 50% respiratory variability, suggesting right atrial pressure of 3 mmHg. Comparison(s): No prior Echocardiogram. No evidence for RV strain." -C/w Telemetry monitoring -Heparin gtt and likely transition to DOAC but ? If too prohibitively expensive  -Hypercoaguable Panel sent by PCCM -Trend CBC -LLE neurovascular checks -C/w Bedrest -As this is the second PE would recommend lifelong [facility] -VVS consulted; plan is for thrombectomy of the popliteal approach on Wednesday 7/3 unless she needs it earlier  Transient Vision change associated with Headache -Patient reported to the nurse multiple times in last few hours that she she's had a very thin shooting light come and go in her side vision but no vision loss. Having a frontal headache as well  -Likely a Migraine but will obtain Head CT w/o Contrast given patient is on systemic Anticoagulation -Head CT ordered and pending  Hyperglycemia -Glucose and CBG Trend:  Recent Labs  Lab 08/16/22 1505 08/16/22 1935 08/17/22 0826 08/18/22 0123  GLUCOSE 116*  --  178* 113*  GLUCAP  --   100*  --   --    Lactic Acidosis -LA Trend: Recent Labs  Lab 08/16/22 2252 08/17/22 0826  LATICACIDVEN 2.4* 1.1  -Improved and will not continue to Trend  Normocytic Anemia -Hgb/Hct Trend: Recent Labs  Lab 08/16/22 1505 08/16/22 1802 08/17/22 0826 08/18/22 0123  HGB 12.7 11.7* 12.3 11.9*  HCT 37.7 34.6* 37.3 36.4  MCV 94.0 94.8 94.4 95.0  -Check Anemia Panel in the AM -Continue to Monitor for S/Sx of Bleeding; no overt bleeding noted -Repeat CBC in the AM  Thrombocytopenia -Platelet Count Trend: Recent Labs  Lab 08/16/22 1505 08/16/22 1802 08/17/22 0826 08/18/22 0123  PLT 156 125* 145* 157  -Continue to Monitor for S/Sx of Bleeding; No overt bleeding noted -Repeat CBC in the AM  Obesity -Complicates overall prognosis and care -Estimated body mass index is 32.34 kg/m as calculated from the following:   Height as of this encounter: 5' 8.5" (1.74 m).   Weight as of this encounter: 97.9 kg.  -Weight Loss and Dietary Counseling given

## 2022-08-18 NOTE — TOC Benefit Eligibility Note (Addendum)
Pharmacy Patient Advocate Encounter  Insurance verification completed.    The patient is insured through [employer]  Ran test claim for Eliquis 5 mg and the current 30 day co-pay is $110.24.  Ran test claim for Xarelto 20 mg and the current 30 day co-pay is $110.50.   This test claim was processed through [facility]- copay amounts may vary at other pharmacies due to pharmacy/plan contracts, or as the patient moves through the different stages of their insurance plan.    Roland Earl, CPHT Pharmacy Patient Advocate Specialist [facility] Patient Advocate Team Direct Number: [phone]  Fax: [phone]

## 2022-08-19 ENCOUNTER — Encounter (HOSPITAL_COMMUNITY)
Admission: EM | Disposition: A | Discharge status: Home / Self Care | Payer: Self-pay | Source: Home / Self Care | Attending: Internal Medicine

## 2022-08-19 ENCOUNTER — Other Ambulatory Visit (HOSPITAL_COMMUNITY): Payer: Self-pay

## 2022-08-19 DIAGNOSIS — I82492 Acute embolism and thrombosis of other specified deep vein of left lower extremity: Secondary | ICD-10-CM

## 2022-08-19 DIAGNOSIS — I82412 Acute embolism and thrombosis of left femoral vein: Secondary | ICD-10-CM

## 2022-08-19 HISTORY — PX: PERIPHERAL VASCULAR THROMBECTOMY: CATH118306

## 2022-08-19 HISTORY — PX: PERIPHERAL VASCULAR ULTRASOUND/IVUS: CATH118334

## 2022-08-19 HISTORY — PX: LOWER EXTREMITY VENOGRAPHY: CATH118253

## 2022-08-19 LAB — COMPREHENSIVE METABOLIC PANEL
ALT: 13 U/L (ref 0–44)
AST: 14 U/L — ABNORMAL LOW (ref 15–41)
Albumin: 3.1 g/dL — ABNORMAL LOW (ref 3.5–5.0)
Alkaline Phosphatase: 48 U/L (ref 38–126)
Anion gap: 8 (ref 5–15)
BUN: 6 mg/dL — ABNORMAL LOW (ref 8–23)
CO2: 24 mmol/L (ref 22–32)
Calcium: 8.8 mg/dL — ABNORMAL LOW (ref 8.9–10.3)
Chloride: 108 mmol/L (ref 98–111)
Creatinine, Ser: 0.61 mg/dL (ref 0.44–1.00)
GFR, Estimated: >60 mL/min (ref >60)
Glucose, Bld: 124 mg/dL — ABNORMAL HIGH (ref 70–99)
Potassium: 3.5 mmol/L (ref 3.5–5.1)
Sodium: 140 mmol/L (ref 135–145)
Total Bilirubin: 0.5 mg/dL (ref 0.3–1.2)
Total Protein: 5.8 g/dL — ABNORMAL LOW (ref 6.5–8.1)

## 2022-08-19 LAB — CBC WITH DIFFERENTIAL/PLATELET
Abs Immature Granulocytes: 0.12 10*3/uL — ABNORMAL HIGH (ref 0.00–0.07)
Basophils Absolute: 0.1 10*3/uL (ref 0.0–0.1)
Basophils Relative: 1 %
Eosinophils Absolute: 0.2 10*3/uL (ref 0.0–0.5)
Eosinophils Relative: 2 %
HCT: 36.4 % (ref 36.0–46.0)
Hemoglobin: 12 g/dL (ref 12.0–15.0)
Immature Granulocytes: 2 %
Lymphocytes Relative: 15 %
Lymphs Abs: 1.1 10*3/uL (ref 0.7–4.0)
MCH: 31.3 pg (ref 26.0–34.0)
MCHC: 33 g/dL (ref 30.0–36.0)
MCV: 94.8 fL (ref 80.0–100.0)
Monocytes Absolute: 0.8 10*3/uL (ref 0.1–1.0)
Monocytes Relative: 11 %
Neutro Abs: 5.2 10*3/uL (ref 1.7–7.7)
Neutrophils Relative %: 69 %
Platelets: 173 10*3/uL (ref 150–400)
RBC: 3.84 MIL/uL — ABNORMAL LOW (ref 3.87–5.11)
RDW: 13.6 % (ref 11.5–15.5)
WBC: 7.5 10*3/uL (ref 4.0–10.5)
nRBC: 0 % (ref 0.0–0.2)

## 2022-08-19 LAB — RETICULOCYTES
Immature Retic Fract: 13.7 % (ref 2.3–15.9)
RBC.: 3.86 MIL/uL — ABNORMAL LOW (ref 3.87–5.11)
Retic Count, Absolute: 62.5 10*3/uL (ref 19.0–186.0)
Retic Ct Pct: 1.6 % (ref 0.4–3.1)

## 2022-08-19 LAB — IRON AND TIBC
Iron: 37 ug/dL (ref 28–170)
Saturation Ratios: 12 % (ref 10.4–31.8)
TIBC: 304 ug/dL (ref 250–450)
UIBC: 267 ug/dL

## 2022-08-19 LAB — PHOSPHORUS: Phosphorus: 3.7 mg/dL (ref 2.5–4.6)

## 2022-08-19 LAB — FERRITIN: Ferritin: 115 ng/mL (ref 11–307)

## 2022-08-19 LAB — VITAMIN B12: Vitamin B-12: 379 pg/mL (ref 180–914)

## 2022-08-19 LAB — MAGNESIUM: Magnesium: 2.1 mg/dL (ref 1.7–2.4)

## 2022-08-19 LAB — HEPARIN LEVEL (UNFRACTIONATED): Heparin Unfractionated: 0.39 IU/mL (ref 0.30–0.70)

## 2022-08-19 LAB — FOLATE: Folate: 10.7 ng/mL (ref >5.9)

## 2022-08-19 SURGERY — PERIPHERAL VASCULAR THROMBECTOMY
Anesthesia: LOCAL

## 2022-08-19 MED ORDER — FENTANYL CITRATE (PF) 100 MCG/2ML IJ SOLN
INTRAMUSCULAR | Status: AC
  Filled 2022-08-19: qty 2

## 2022-08-19 MED ORDER — APIXABAN (ELIQUIS) VTE STARTER PACK (10MG AND 5MG)
ORAL_TABLET | ORAL | 0 refills | Status: DC
  Filled 2022-08-19: qty 74, 30d supply, fill #0

## 2022-08-19 MED ORDER — HEPARIN (PORCINE) IN NACL 1000-0.9 UT/500ML-% IV SOLN
INTRAVENOUS | Status: DC | PRN
  Administered 2022-08-19: 500 mL

## 2022-08-19 MED ORDER — FENTANYL CITRATE (PF) 100 MCG/2ML IJ SOLN
INTRAMUSCULAR | Status: DC | PRN
  Administered 2022-08-19 (×2): 50 ug via INTRAVENOUS

## 2022-08-19 MED ORDER — LIDOCAINE HCL (PF) 1 % IJ SOLN
INTRAMUSCULAR | Status: AC
  Filled 2022-08-19: qty 30

## 2022-08-19 MED ORDER — LIDOCAINE HCL (PF) 1 % IJ SOLN
INTRAMUSCULAR | Status: DC | PRN
  Administered 2022-08-19: 10 mL

## 2022-08-19 MED ORDER — HYDRALAZINE HCL 20 MG/ML IJ SOLN: 5.0000 mg | INTRAMUSCULAR | Status: DC | PRN

## 2022-08-19 MED ORDER — MIDAZOLAM HCL 2 MG/2ML IJ SOLN
INTRAMUSCULAR | Status: AC
  Filled 2022-08-19: qty 2

## 2022-08-19 MED ORDER — HEPARIN SODIUM (PORCINE) 1000 UNIT/ML IJ SOLN
INTRAMUSCULAR | Status: AC
  Filled 2022-08-19: qty 10

## 2022-08-19 MED ORDER — SODIUM CHLORIDE 0.9% FLUSH
3.0000 mL | Freq: Two times a day (BID) | INTRAVENOUS | Status: DC
  Administered 2022-08-19: 3 mL via INTRAVENOUS

## 2022-08-19 MED ORDER — SODIUM CHLORIDE 0.9 % IV SOLN: INTRAVENOUS | Status: DC

## 2022-08-19 MED ORDER — ACETAMINOPHEN 325 MG PO TABS
650.0000 mg | ORAL_TABLET | ORAL | Status: DC | PRN
  Administered 2022-08-19: 650 mg via ORAL
  Filled 2022-08-19: qty 2

## 2022-08-19 MED ORDER — SODIUM CHLORIDE 0.9 % WEIGHT BASED INFUSION
1.0000 mL/kg/h | INTRAVENOUS | Status: AC
  Administered 2022-08-19 (×2): 1 mL/kg/h via INTRAVENOUS

## 2022-08-19 MED ORDER — HEPARIN SODIUM (PORCINE) 1000 UNIT/ML IJ SOLN
INTRAMUSCULAR | Status: DC | PRN
  Administered 2022-08-19: 8000 [IU] via INTRAVENOUS

## 2022-08-19 MED ORDER — ASPIRIN 81 MG PO TBEC
81.0000 mg | DELAYED_RELEASE_TABLET | Freq: Every day | ORAL | Status: DC
  Administered 2022-08-19 – 2022-08-20 (×2): 81 mg via ORAL
  Filled 2022-08-19 (×2): qty 1

## 2022-08-19 MED ORDER — ONDANSETRON HCL 4 MG/2ML IJ SOLN: 4.0000 mg | Freq: Four times a day (QID) | INTRAMUSCULAR | Status: DC | PRN

## 2022-08-19 MED ORDER — SODIUM CHLORIDE 0.9 % IV SOLN: 250.0000 mL | INTRAVENOUS | Status: DC | PRN

## 2022-08-19 MED ORDER — LABETALOL HCL 5 MG/ML IV SOLN: 10.0000 mg | INTRAVENOUS | Status: DC | PRN

## 2022-08-19 MED ORDER — SODIUM CHLORIDE 0.9% FLUSH: 3.0000 mL | INTRAVENOUS | Status: DC | PRN

## 2022-08-19 MED ORDER — IODIXANOL 320 MG/ML IV SOLN
INTRAVENOUS | Status: DC | PRN
  Administered 2022-08-19: 60 mL

## 2022-08-19 MED ORDER — PSYLLIUM 95 % PO PACK
1.0000 | PACK | Freq: Every day | ORAL | Status: DC
  Filled 2022-08-19: qty 1

## 2022-08-19 MED ORDER — MIDAZOLAM HCL 2 MG/2ML IJ SOLN
INTRAMUSCULAR | Status: DC | PRN
  Administered 2022-08-19: 1 mg via INTRAVENOUS

## 2022-08-19 MED ORDER — PSYLLIUM 95 % PO PACK
1.0000 | PACK | Freq: Every day | ORAL | Status: DC
  Administered 2022-08-19: 1 via ORAL
  Filled 2022-08-19 (×2): qty 1

## 2022-08-19 SURGICAL SUPPLY — 12 items
CATH ANGIO 5F BER 100CM (CATHETERS) IMPLANT
CATH CLOT TRIEVER BOLD (CATHETERS) IMPLANT
CATH VISIONS PV .035 IVUS (CATHETERS) IMPLANT
COVER DOME SNAP 22 D (MISCELLANEOUS) IMPLANT
GLIDEWIRE ADV .035X260CM (WIRE) IMPLANT
KIT MICROPUNCTURE NIT STIFF (SHEATH) IMPLANT
PROTECTION STATION PRESSURIZED (MISCELLANEOUS) ×2
SHEATH CLOT RETRIEVER (SHEATH) IMPLANT
SHEATH PINNACLE 8F 10CM (SHEATH) IMPLANT
STATION PROTECTION PRESSURIZED (MISCELLANEOUS) IMPLANT
TRAY PV CATH (CUSTOM PROCEDURE TRAY) IMPLANT
WIRE BENTSON .035X145CM (WIRE) IMPLANT

## 2022-08-19 NOTE — Progress Notes (Signed)
Pt arriving to holding area with Heparin running at 72ml/hr. And NS at 178mls/hr. Denies pain. Aox4. Awaiting team.

## 2022-08-19 NOTE — Op Note (Signed)
    Patient name: Kathryn Pineda MRN: [id number] DOB: [birth year] Sex: female  08/16/2022 - 08/19/2022 Pre-operative Diagnosis: Left lower extremity iliofemoral deep venous thrombosis Post-operative diagnosis:  Same Surgeon:  Victorino Sparrow, MD Procedure Performed: 1.  Ultrasound-guided micropuncture access of the left popliteal vein 2.  Iliocaval venogram, left lower extremity venogram 3.  Intravascular ultrasound inferior vena cava, left common iliac, external iliac vein, common femoral vein, femoral vein 4.  Percutaneous mechanical thrombectomy-Inari clot Treiver 5.  Moderate sedation time 43 minutes, contrast volume 60 mL   Indications: Patient is a 67 year old female with previous history of right-sided deep venous thrombosis, superficial venous thrombosis, who presented to the hospital with a several day history of left lower extremity pain after recently recovering from COVID, and taking a 4-hour car trip.  Imaging demonstrated iliofemoral deep venous thrombosis extending into the common femoral vein femoral vein and into the tibial vessels.  Findings:  Patent popliteal vein, patent femoral vein, patent common femoral vein.  Occluded external iliac vein, common femoral vein with clot extending into the distal inferior vena cava. No left common iliac vein compression. No May Thurner physiology.   Procedure:  The patient was identified in the holding area and taken to room 8.  Patient was laid prone on the Cath Lab table.  She was prepped and draped in standard fashion a timeout was performed.  Next, moderate anesthesia was induced using fentanyl and Versed.    An ultrasound-guided micropuncture needle was used to access the left popliteal vein.  This was upsized to an 8 [place] sheath and diagnostic venography followed.  See above.  Contrast had a difficult time losing beyond the common femoral vein, therefore catheter was used for injections within the external iliac, common iliac and IVC.   Due to poor visualization of the external iliac, common iliac vein and IVC, and moved to intravascular ultrasound.  This demonstrated diffuse clot extending into the inferior vena cava.  The thrombus in the inferior vena cava caused only partial occlusion.    At this point, I elected to move forward with percutaneous mechanical thrombectomy.  The Inari clot Treiver device was brought onto the field and prepped in standard fashion.  The device was run over a Glidewire advantage.  In total, 4 passes were made into the 12:00, 3:00, 6:00, 9:00 positions.  The first 3 passes yielded significant thrombotic return.  The last past was clean.  I then moved to completion venography which demonstrated a widely patent left ilio caval system.  An ultrasound was used to ensure there was no pancaking.  There was no venous compression identified within the ileal caval system.  At this point, I elected to terminate the case. The percutaneous access site was managed with a Monocryl suture    J. Gillis Santa, MD [facility]: [phone]

## 2022-08-19 NOTE — Progress Notes (Addendum)
ANTICOAGULATION CONSULT NOTE- Follow Up  Pharmacy Consult for Heparin Indication: DVT and PE   Allergies  Allergen Reactions   Penicillins Itching and Rash   Adhesive [Tape] Other (See Comments)    Band-Aid causes blisters   Paxlovid [Nirmatrelvir-Ritonavir]     Severe allergic reaction    Patient Measurements: Height: 5' 8.5" (174 cm) Weight: 97.4 kg (214 lb 11.7 oz) IBW/kg (Calculated) : 65.05 Heparin Dosing Weight: 86.2 kg  Vital Signs: Temp: 98.1 F (36.7 C) (07/03 0719) Temp Source: Oral (07/03 0719) BP: 122/68 (07/03 0719) Pulse Rate: 72 (07/03 0719)  Labs: Recent Labs    08/16/22 1505 08/16/22 1802 08/16/22 2020 08/16/22 2252 08/17/22 0826 08/17/22 1536 08/18/22 0123 08/19/22 0059  HGB 12.7   < >  --   --  12.3  --  11.9* 12.0  HCT 37.7   < >  --   --  37.3  --  36.4 36.4  PLT 156   < >  --   --  145*  --  157 173  HEPARINUNFRC  --    < >  --  >1.10* 0.78* 0.66 0.51 0.39  CREATININE 0.71  --   --   --  0.61  --  0.59 0.61  TROPONINIHS 6  --  7 8  --   --   --   --    < > = values in this interval not displayed.     Estimated Creatinine Clearance: 86.3 mL/min (by C-G formula based on SCr of 0.61 mg/dL).   Assessment: 67 yo F presents with extensive occlusive DVT of LLE from L iliac vein to the level of the calf veins and moderate submassive PE with RHS. Hx of DVT in 2020 and 2021. Previously on rivaroxaban x3 months in 2021 and x3 months in 2022 for leg swelling w no confirmed DVT.  Pharmacy consulted to dose heparin.  Heparin level 0.39 is therapeutic on 1200units/hr. Heparin level has trended down while on 1200units/hr, will increase dose slightly to keep heparin level in goal. Hypercoag labs pending.  Planned mechanical thrombectomy 7/3.   Goal of Therapy:  Heparin level 0.3-0.7 units/ml Monitor platelets by anticoagulation protocol: Yes   Plan:  Increase to heparin 1250 units/hr Monitor daily CBC, heparin levels, and for s/sx of bleeding F/U  transition to apixaban after thrombectomy  Romie Minus, PharmD PGY1 Pharmacy Resident  Please check AMION for all [facility] Pharmacy phone numbers After 10:00 PM, call Main Pharmacy [phone]

## 2022-08-19 NOTE — Progress Notes (Signed)
  Daily Progress Note  LLE DVT  Subjective: Doing well. No complaints. Leg feels slightly better  Objective: Vitals:   08/19/22 0700 08/19/22 0719  BP: 101/65 122/68  Pulse: 75 72  Resp: 16 14  Temp: 98.4 F (36.9 C) 98.1 F (36.7 C)  SpO2: 99% 97%    Physical Examination Thigh soft Palpable DP pulse Nonlabored breathing  ASSESSMENT/PLAN:  Patient is a 67 year old female with previous history of right-sided DVT which occurred at the time of her first COVID-vaccine.  Right-sided superficial thrombophlebitis.  In both cases she was treated for 3 months anticoagulation.  She was recently diagnosed with COVID, and has recovered, but in the interim, took a 4-hour ride to the beach.  Upon arrival, noted pain and heaviness in her left calf.  This continued to worsen and a DVT was appreciated.  Imaging demonstrated left lower extremity iliofemoral DVT.  She is symptomatic, having difficulty with ambulation.  After discussing risk and benefits of left lower extremity percutaneous thrombectomy, we selected to proceed.  On CT, there is also question of possible May Thurner.  This will be evaluated IntraOp.  I had a long discussion with her regarding the above.  We also discussed that it is not common for women in their 24s to develop May Thurner.  Should the lesion demonstrate only mild to moderate narrowing, I would be more inclined to venoplasty rather than stent, due to the sequela that can arise from stenting.    Fara Olden MD MS [facility] [phone] 08/19/2022  10:29 AM

## 2022-08-19 NOTE — Discharge Instructions (Signed)
Information on my medicine - ELIQUIS (apixaban)  This medication education was reviewed with me or my healthcare representative as part of my discharge preparation.     Why was Eliquis prescribed for you? Eliquis was prescribed to treat blood clots that may have been found in the veins of your legs (deep vein thrombosis) or in your lungs (pulmonary embolism) and to reduce the risk of them occurring again.  What do You need to know about Eliquis ? The starting dose is 10 mg (two 5 mg tablets) taken TWICE daily for the FIRST SEVEN (7) DAYS, then on   7/11  the dose is reduced to ONE 5 mg tablet taken TWICE daily.  Eliquis may be taken with or without food.   Try to take the dose about the same time in the morning and in the evening. If you have difficulty swallowing the tablet whole please discuss with your pharmacist how to take the medication safely.  Take Eliquis exactly as prescribed and DO NOT stop taking Eliquis without talking to the doctor who prescribed the medication.  Stopping may increase your risk of developing a new blood clot.  Refill your prescription before you run out.  After discharge, you should have regular check-up appointments with your healthcare provider that is prescribing your Eliquis.    What do you do if you miss a dose? If a dose of ELIQUIS is not taken at the scheduled time, take it as soon as possible on the same day and twice-daily administration should be resumed. The dose should not be doubled to make up for a missed dose.  Important Safety Information A possible side effect of Eliquis is bleeding. You should call your healthcare provider right away if you experience any of the following: Bleeding from an injury or your nose that does not stop. Unusual colored urine (red or dark brown) or unusual colored stools (red or black). Unusual bruising for unknown reasons. A serious fall or if you hit your head (even if there is no bleeding).  Some  medicines may interact with Eliquis and might increase your risk of bleeding or clotting while on Eliquis. To help avoid this, consult your healthcare provider or pharmacist prior to using any new prescription or non-prescription medications, including herbals, vitamins, non-steroidal anti-inflammatory drugs (NSAIDs) and supplements.  This website has more information on Eliquis (apixaban): [email or web address].[email or web address] ========================================================= Pulmonary Embolism    A pulmonary embolism (PE) is a sudden blockage or decrease of blood flow in one or both lungs. Most blockages come from a blood clot that forms in the vein of a lower leg, thigh, or arm (deep vein thrombosis, DVT) and travels to the lungs. A clot is blood that has thickened into a gel or solid. PE is a dangerous and life-threatening condition that needs to be treated right away.  What are the causes? This condition is usually caused by a blood clot that forms in a vein and moves to the lungs. In rare cases, it may be caused by air, fat, part of a tumor, or other tissue that moves through the veins and into the lungs.  What increases the risk? The following factors may make you more likely to develop this condition: Experiencing a traumatic injury, such as breaking a hip or leg. Having: A spinal cord injury. Orthopedic surgery, especially hip or knee replacement. Any major surgery. A stroke. DVT. Blood clots or blood clotting disease. Long-term (chronic) lung or heart disease. Cancer treated with chemotherapy. A central venous catheter.  Taking medicines that contain estrogen. These include birth control pills and hormone replacement therapy. Being: Pregnant. In the period of time after your baby is delivered (postpartum). Older than age 106. Overweight. A smoker, especially if you have other risks.  What are the signs or symptoms? Symptoms of this condition usually start suddenly  and include: Shortness of breath during activity or at rest. Coughing, coughing up blood, or coughing up blood-tinged mucus. Chest pain that is often worse with deep breaths. Rapid or irregular heartbeat. Feeling light-headed or dizzy. Fainting. Feeling anxious. Fever. Sweating. Pain and swelling in a leg. This is a symptom of DVT, which can lead to PE. How is this diagnosed? This condition may be diagnosed based on: Your medical history. A physical exam. Blood tests. CT pulmonary angiogram. This test checks blood flow in and around your lungs. Ventilation-perfusion scan, also called a lung VQ scan. This test measures air flow and blood flow to the lungs. An ultrasound of the legs.  How is this treated? Treatment for this condition depends on many factors, such as the cause of your PE, your risk for bleeding or developing more clots, and other medical conditions you have. Treatment aims to remove, dissolve, or stop blood clots from forming or growing larger. Treatment may include: Medicines, such as: Blood thinning medicines (anticoagulants) to stop clots from forming. Medicines that dissolve clots (thrombolytics). Procedures, such as: Using a flexible tube to remove a blood clot (embolectomy) or to deliver medicine to destroy it (catheter-directed thrombolysis). Inserting a filter into a large vein that carries blood to the heart (inferior vena cava). This filter (vena cava filter) catches blood clots before they reach the lungs. Surgery to remove the clot (surgical embolectomy). This is rare. You may need a combination of immediate, long-term (up to 3 months after diagnosis), and extended (more than 3 months after diagnosis) treatments. Your treatment may continue for several months (maintenance therapy). You and your health care provider will work together to choose the treatment program that is best for you.  Follow these instructions at home: Medicines Take over-the-counter and  prescription medicines only as told by your health care provider. If you are taking an anticoagulant medicine: Take the medicine every day at the same time each day. Understand what foods and drugs interact with your medicine. Understand the side effects of this medicine, including excessive bruising or bleeding. Ask your health care provider or pharmacist about other side effects.  General instructions Wear a medical alert bracelet or carry a medical alert card that says you have had a PE and lists what medicines you take. Ask your health care provider when you may return to your normal activities. Avoid sitting or lying for a long time without moving. Maintain a healthy weight. Ask your health care provider what weight is healthy for you. Do not use any products that contain nicotine or tobacco, such as cigarettes, e-cigarettes, and chewing tobacco. If you need help quitting, ask your health care provider. Talk with your health care provider about any travel plans. It is important to make sure that you are still able to take your medicine while on trips. Keep all follow-up visits as told by your health care provider. This is important.  Contact a health care provider if: You missed a dose of your blood thinner medicine.  Get help right away if: You have: New or increased pain, swelling, warmth, or redness in an arm or leg. Numbness or tingling in an arm or leg. Shortness  of breath during activity or at rest. A fever. Chest pain. A rapid or irregular heartbeat. A severe headache. Vision changes. A serious fall or accident, or you hit your head. Stomach (abdominal) pain. Blood in your vomit, stool, or urine. A cut that will not stop bleeding. You cough up blood. You feel light-headed or dizzy. You cannot move your arms or legs. You are confused or have memory loss.  These symptoms may represent a serious problem that is an emergency. Do not wait to see if the symptoms will go  away. Get medical help right away. Call your local emergency services (911 in the [place] Do not drive yourself to the hospital. Summary A pulmonary embolism (PE) is a sudden blockage or decrease of blood flow in one or both lungs. PE is a dangerous and life-threatening condition that needs to be treated right away. Treatments for this condition usually include medicines to thin your blood (anticoagulants) or medicines to break apart blood clots (thrombolytics). If you are given blood thinners, it is important to take the medicine every day at the same time each day. Understand what foods and drugs interact with any medicines that you are taking. If you have signs of PE or DVT, call your local emergency services (911 in the [place] This information is not intended to replace advice given to you by your health care provider. Make sure you discuss any questions you have with your health care provider. Document Revised: 11/10/2017 Document Reviewed: 11/10/2017 Elsevier Patient Education  2020 [employer]   ==================================================  Deep Vein Thrombosis    Deep vein thrombosis (DVT) is a condition in which a blood clot forms in a deep vein, such as a lower leg, thigh, or arm vein. A clot is blood that has thickened into a gel or solid. This condition is dangerous. It can lead to serious and even life-threatening complications if the clot travels to the lungs and causes a blockage (pulmonary embolism). It can also damage veins in the leg. This can result in leg pain, swelling, discoloration, and sores (post-thrombotic syndrome).  What are the causes? This condition may be caused by: A slowdown of blood flow. Damage to a vein. A condition that causes blood to clot more easily, such as an inherited clotting disorder.  What increases the risk? The following factors may make you more likely to develop this condition: Being overweight. Being older, especially over age  47. Sitting or lying down for more than four hours. Being in the hospital. Lack of physical activity (sedentary lifestyle). Pregnancy, being in childbirth, or having recently given birth. Taking medicines that contain estrogen, such as medicines to prevent pregnancy. Smoking. A history of any of the following: Blood clots or a blood clotting disease. Peripheral vascular disease. Inflammatory bowel disease. Cancer. Heart disease. Genetic conditions that affect how your blood clots, such as Factor V Leiden mutation. Neurological diseases that affect your legs (leg paresis). A recent injury, such as a car accident. Major or lengthy surgery. A central line placed inside a large vein.  What are the signs or symptoms? Symptoms of this condition include: Swelling, pain, or tenderness in an arm or leg. Warmth, redness, or discoloration in an arm or leg. If the clot is in your leg, symptoms may be more noticeable or worse when you stand or walk. Some people may not develop any symptoms.  How is this diagnosed? This condition is diagnosed with: A medical history and physical exam. Tests, such as: Blood tests. These  are done to check how well your blood clots. Ultrasound. This is done to check for clots. Venogram. For this test, contrast dye is injected into a vein and X-rays are taken to check for any clots  How is this treated? Treatment for this condition depends on: The cause of your DVT. Your risk for bleeding or developing more clots. Any other medical conditions that you have. Treatment may include: Taking a blood thinner (anticoagulant). This type of medicine prevents clots from forming. It may be taken by mouth, injected under the skin, or injected through an IV (catheter). Injecting clot-dissolving medicines into the affected vein (catheter-directed thrombolysis). Having surgery. Surgery may be done to: Remove the clot. Place a filter in a large vein to catch blood clots  before they reach the lungs. Some treatments may be continued for up to six months.  Follow these instructions at home: If you are taking blood thinners: Take the medicine exactly as told by your health care provider. Some blood thinners need to be taken at the same time every day. Do not skip a dose. Talk with your health care provider before you take any medicines that contain aspirin or NSAIDs. These medicines increase your risk for dangerous bleeding. Ask your health care provider about foods and drugs that could change the way the medicine works (may interact). Avoid those things if your health care provider tells you to do so. Blood thinners can cause easy bruising and may make it difficult to stop bleeding. Because of this: Be very careful when using knives, scissors, or other sharp objects. Use an electric razor instead of a blade. Avoid activities that could cause injury or bruising, and follow instructions about how to prevent falls. Wear a medical alert bracelet or carry a card that lists what medicines you take.  General instructions Take over-the-counter and prescription medicines only as told by your health care provider. Return to your normal activities as told by your health care provider. Ask your health care provider what activities are safe for you. Wear compression stockings if recommended by your health care provider. Keep all follow-up visits as told by your health care provider. This is important.  How is this prevented? To lower your risk of developing this condition again: For 30 or more minutes every day, do an activity that: Involves moving your arms and legs. Increases your heart rate. When traveling for longer than four hours: Exercise your arms and legs every hour. Drink plenty of water. Avoid drinking alcohol. Avoid sitting or lying for a long time without moving your legs. If you have surgery or you are hospitalized, ask about ways to prevent blood clots.  These may include taking frequent walks or using anticoagulants. Stay at a healthy weight. If you are a woman who is older than age 50, avoid unnecessary use of medicines that contain estrogen, such as some birth control pills. Do not use any products that contain nicotine or tobacco, such as cigarettes and e-cigarettes. This is especially important if you take estrogen medicines. If you need help quitting, ask your health care provider.  Contact a health care provider if: You miss a dose of your blood thinner. Your menstrual period is heavier than usual. You have unusual bruising.  Get help right away if: You have: New or increased pain, swelling, or redness in an arm or leg. Numbness or tingling in an arm or leg. Shortness of breath. Chest pain. A rapid or irregular heartbeat. A severe headache or confusion. A  cut that will not stop bleeding. There is blood in your vomit, stool, or urine. You have a serious fall or accident, or you hit your head. You feel light-headed or dizzy. You cough up blood.  These symptoms may represent a serious problem that is an emergency. Do not wait to see if the symptoms will go away. Get medical help right away. Call your local emergency services (911 in the [place] Do not drive yourself to the hospital. Summary Deep vein thrombosis (DVT) is a condition in which a blood clot forms in a deep vein, such as a lower leg, thigh, or arm vein. Symptoms can include swelling, warmth, pain, and redness in your leg or arm. This condition may be treated with a blood thinner (anticoagulant medicine), medicine that is injected to dissolve blood clots,compression stockings, or surgery. If you are prescribed blood thinners, take them exactly as told. This information is not intended to replace advice given to you by your health care provider. Make sure you discuss any questions you have with your health care provider. Document Revised: 01/15/2017 Document Reviewed:  07/03/2016 Elsevier Patient Education  2020 [employer]    Discharge Instructions for DVT (Deep Vein Thrombosis):  Call 911 or visit the nearest emergency room if you experience any of the following:  Sudden chest tightness or pain. Sharp pain when taking a deep breath. Cough with bloody mucus. Sudden shortness of breath or fast breathing A fast heart rate. Cold skin, clammy skin, or sweating. New swelling in the arms or legs.  Continue to take your anticoagulation medication daily as prescribed Elevate your affected extremity daily above the level of the heart Wear your thigh high compression stocking daily You will have follow up with your Vascular provider in 1 month with an ultrasound

## 2022-08-19 NOTE — Progress Notes (Signed)
PROGRESS NOTE    Kathryn Pineda  ZOX:[id number] DOB: 07-26-1955 DOA: 08/16/2022 PCP: Pearline Cables, MD    Brief Narrative:  67 year old with history of a lower extremity DVT in 2020, recent COVID infection 5 weeks ago who has been feeling fatigue and weakness since then presented on 6/30 with worsening left leg pain, transient shortness of breath and lightheadedness.  Patient was found to have extensive left lower extremity DVTs as well as pulmonary embolism and admitted to the hospital with vascular surgery consultation and on heparin infusion.   Assessment & Plan:   Submassive pulmonary embolism with mild right ventricular strain on CT Left lower extremity femoral iliac DVT, near occlusion  Initially admitted to ICU.  Started on heparin infusion. Echocardiogram with normal ejection fraction, right ventricular systolic function is normal Mechanical thrombectomy planned today due to large clot burden inferior vena cava Likely transition to oral anticoagulation tomorrow. DVT and pulmonary embolism likely provoked by recent COVID-19 infection and aggravated by car ride. Given submassive PE and third episode of thromboembolism, she would likely benefit with lifelong anticoagulation.   DVT prophylaxis: Heparin infusion   Code Status: Full code Family Communication: None at bedside Disposition Plan: Status is: Inpatient Remains inpatient appropriate because: Inpatient procedures planned     Consultants:  Vascular surgery  Procedures:  Mechanical thrombectomy  Antimicrobials:  Periprocedural   Subjective: Patient seen in the morning rounds.  She tells me her left leg is much better.  Denies any chest pain except on mobility.  On room air.  Objective: Vitals:   08/19/22 0700 08/19/22 0719 08/19/22 1046 08/19/22 1208  BP: 101/65 122/68  121/60  Pulse: 75 72  62  Resp: 16 14  11   Temp: 98.4 F (36.9 C) 98.1 F (36.7 C)  97.9 F (36.6 C)  TempSrc: Oral Oral  Oral   SpO2: 99% 97% 100% 98%  Weight:      Height:        Intake/Output Summary (Last 24 hours) at 08/19/2022 1416 Last data filed at 08/19/2022 0841 Gross per 24 hour  Intake 161.31 ml  Output 2900 ml  Net -2738.69 ml   Filed Weights   08/17/22 0500 08/18/22 0449 08/19/22 0500  Weight: 97.7 kg 97.9 kg 97.4 kg    Examination:  General exam: Appears calm and comfortable at rest Respiratory system: Clear to auscultation. Respiratory effort normal.  No added sounds. Cardiovascular system: S1 & S2 heard, RRR.  Patient has 1+ edema left leg.  No palpable tenderness.  Distal pulses are palpable. Gastrointestinal system: Abdomen is nondistended, soft and nontender. No organomegaly or masses felt. Normal bowel sounds heard. Central nervous system: Alert and oriented. No focal neurological deficits. Extremities: Symmetric 5 x 5 power. Skin: No rashes, lesions or ulcers Psychiatry: Judgement and insight appear normal. Mood & affect appropriate.     Data Reviewed: I have personally reviewed following labs and imaging studies  CBC: Recent Labs  Lab 08/16/22 1505 08/16/22 1802 08/17/22 0826 08/18/22 0123 08/19/22 0059  WBC 8.3 7.7 8.1 7.3 7.5  NEUTROABS 6.3  --   --  5.0 5.2  HGB 12.7 11.7* 12.3 11.9* 12.0  HCT 37.7 34.6* 37.3 36.4 36.4  MCV 94.0 94.8 94.4 95.0 94.8  PLT 156 125* 145* 157 173   Basic Metabolic Panel: Recent Labs  Lab 08/16/22 1505 08/17/22 0826 08/18/22 0123 08/19/22 0059  NA 137 137 139 140  K 3.6 3.9 3.6 3.5  CL 104 103 108 108  CO2 23  25 25 24   GLUCOSE 116* 178* 113* 124*  BUN 16 7* 6* 6*  CREATININE 0.71 0.61 0.59 0.61  CALCIUM 9.1 8.6* 8.4* 8.8*  MG  --   --   --  2.1  PHOS  --   --   --  3.7   GFR: Estimated Creatinine Clearance: 86.3 mL/min (by C-G formula based on SCr of 0.61 mg/dL). Liver Function Tests: Recent Labs  Lab 08/16/22 1505 08/19/22 0059  AST 19 14*  ALT 14 13  ALKPHOS 59 48  BILITOT 0.7 0.5  PROT 7.0 5.8*  ALBUMIN 3.9 3.1*    No results for input(s): "LIPASE", "AMYLASE" in the last 168 hours. No results for input(s): "AMMONIA" in the last 168 hours. Coagulation Profile: No results for input(s): "INR", "PROTIME" in the last 168 hours. Cardiac Enzymes: No results for input(s): "CKTOTAL", "CKMB", "CKMBINDEX", "TROPONINI" in the last 168 hours. BNP (last 3 results) No results for input(s): "PROBNP" in the last 8760 hours. HbA1C: No results for input(s): "HGBA1C" in the last 72 hours. CBG: Recent Labs  Lab 08/16/22 1935  GLUCAP 100*   Lipid Profile: No results for input(s): "CHOL", "HDL", "LDLCALC", "TRIG", "CHOLHDL", "LDLDIRECT" in the last 72 hours. Thyroid Function Tests: No results for input(s): "TSH", "T4TOTAL", "FREET4", "T3FREE", "THYROIDAB" in the last 72 hours. Anemia Panel: Recent Labs    08/19/22 0059  VITAMINB12 379  FOLATE 10.7  FERRITIN 115  TIBC 304  IRON 37  RETICCTPCT 1.6   Sepsis Labs: Recent Labs  Lab 08/16/22 2252 08/17/22 0826  LATICACIDVEN 2.4* 1.1    Recent Results (from the past 240 hour(s))  MRSA Next Gen by PCR, Nasal     Status: None   Collection Time: 08/16/22  7:56 PM   Specimen: Nasal Mucosa; Nasal Swab  Result Value Ref Range Status   MRSA by PCR Next Gen NOT DETECTED NOT DETECTED Final    Comment: (NOTE) The GeneXpert MRSA Assay (FDA approved for NASAL specimens only), is one component of a comprehensive MRSA colonization surveillance program. It is not intended to diagnose MRSA infection nor to guide or monitor treatment for MRSA infections. Test performance is not FDA approved in patients less than 64 years old. Performed at [facility], [address] [place], [place] [address]          Radiology Studies: PERIPHERAL VASCULAR CATHETERIZATION  Result Date: 08/19/2022 Images from the original result were not included. Patient name: Kathryn Pineda MRN: [id number] DOB: [birth year] Sex: female 08/16/2022 - 08/19/2022 Pre-operative Diagnosis: Left  lower extremity iliofemoral deep venous thrombosis Post-operative diagnosis:  Same Surgeon:  Victorino Sparrow, MD Procedure Performed: 1.  Ultrasound-guided micropuncture access of the left popliteal vein 2.  Iliocaval venogram, left lower extremity venogram 3.  Intravascular ultrasound inferior vena cava, left common iliac, external iliac vein, common femoral vein, femoral vein 4.  Percutaneous mechanical thrombectomy-Inari clot Treiver 5.  Moderate sedation time 43 minutes, contrast volume 60 mL Indications: Patient is a 67 year old female with previous history of right-sided deep venous thrombosis, superficial venous thrombosis, who presented to the hospital with a several day history of left lower extremity pain after recently recovering from COVID, and taking a 4-hour car trip.  Imaging demonstrated iliofemoral deep venous thrombosis extending into the common femoral vein femoral vein and into the tibial vessels. Findings: Patent popliteal vein, patent femoral vein, patent common femoral vein.  Occluded external iliac vein, common femoral vein with clot extending into the distal inferior vena cava.  No left common iliac vein compression. No May Thurner physiology.  Procedure:  The patient was identified in the holding area and taken to room 8.  Patient was laid prone on the Cath Lab table.  She was prepped and draped in standard fashion a timeout was performed.  Next, moderate anesthesia was induced using fentanyl and Versed.  An ultrasound-guided micropuncture needle was used to access the left popliteal vein.  This was upsized to an 8 [place] sheath and diagnostic venography followed.  See above.  Contrast had a difficult time losing beyond the common femoral vein, therefore catheter was used for injections within the external iliac, common iliac and IVC.  Due to poor visualization of the external iliac, common iliac vein and IVC, and moved to intravascular ultrasound.  This demonstrated diffuse clot extending  into the inferior vena cava.  The thrombus in the inferior vena cava caused only partial occlusion.  At this point, I elected to move forward with percutaneous mechanical thrombectomy.  The Inari clot Treiver device was brought onto the field and prepped in standard fashion.  The device was run over a Glidewire advantage.  In total, 4 passes were made into the 12:00, 3:00, 6:00, 9:00 positions.  The first 3 passes yielded significant thrombotic return.  The last past was clean.  I then moved to completion venography which demonstrated a widely patent left ilio caval system.  An ultrasound was used to ensure there was no pancaking.  There was no venous compression identified within the ileal caval system. At this point, I elected to terminate the case. The percutaneous access site was managed with a Monocryl suture Fara Olden, MD [facility] Office: [phone]        Scheduled Meds:  aspirin EC  81 mg Oral Daily   Chlorhexidine Gluconate Cloth  6 each Topical Q0600   loratadine  5 mg Oral Daily   sodium chloride flush  3 mL Intravenous Q12H   Continuous Infusions:  sodium chloride 100 mL/hr at 08/19/22 0841   sodium chloride     sodium chloride 1 mL/kg/hr (08/19/22 1343)   heparin 1,250 Units/hr (08/19/22 1209)     LOS: 3 days    Time spent: 32 minutes    Dorcas Carrow, MD [facility]

## 2022-08-19 NOTE — Plan of Care (Signed)
  Problem: Health Behavior/Discharge Planning: Goal: Ability to manage health-related needs will improve Outcome: Progressing   

## 2022-08-19 NOTE — Progress Notes (Signed)
  Progress Note    08/19/2022 7:42 AM * No surgery date entered *  Left lower extremity iliac femoral DVT She is scheduled for mechanical thrombectomy with Dr. Karin Lieu today She did not have any questions this morning Continue Heparin gtt Keep NPO    Graceann Congress, PA-C [facility] [phone] 08/19/2022 7:42 AM

## 2022-08-20 LAB — CBC
HCT: 33.2 % — ABNORMAL LOW (ref 36.0–46.0)
Hemoglobin: 11.1 g/dL — ABNORMAL LOW (ref 12.0–15.0)
MCH: 31.3 pg (ref 26.0–34.0)
MCHC: 33.4 g/dL (ref 30.0–36.0)
MCV: 93.5 fL (ref 80.0–100.0)
Platelets: 168 10*3/uL (ref 150–400)
RBC: 3.55 MIL/uL — ABNORMAL LOW (ref 3.87–5.11)
RDW: 13.4 % (ref 11.5–15.5)
WBC: 6.3 10*3/uL (ref 4.0–10.5)
nRBC: 0 % (ref 0.0–0.2)

## 2022-08-20 LAB — HEPARIN LEVEL (UNFRACTIONATED): Heparin Unfractionated: 0.47 IU/mL (ref 0.30–0.70)

## 2022-08-20 MED ORDER — APIXABAN 5 MG PO TABS
10.0000 mg | ORAL_TABLET | Freq: Two times a day (BID) | ORAL | Status: DC
  Administered 2022-08-20: 10 mg via ORAL

## 2022-08-20 MED ORDER — APIXABAN 5 MG PO TABS: 5.0000 mg | ORAL_TABLET | Freq: Two times a day (BID) | ORAL | Status: DC

## 2022-08-20 NOTE — Discharge Summary (Signed)
Physician Discharge Summary  Kathryn Pineda ZOX:[id number] DOB: March 13, 1955 DOA: 08/16/2022  PCP: Pearline Cables, MD  Admit date: 08/16/2022 Discharge date: 08/20/2022  Admitted From: Home Disposition: Home  Recommendations for Outpatient Follow-up:  Follow up with PCP in 1-2 weeks Please obtain BMP/CBC in one week Will send referral to hematology for follow-up.  Home Health: N/A Equipment/Devices: N/A  Discharge Condition: Stable CODE STATUS: Full code Diet recommendation: Regular diet  Discharge summary: 67 year old with history of a lower extremity DVT in 2020, recent COVID infection 5 weeks ago who has been feeling fatigue and weakness since then presented on 6/30 with worsening left leg pain, transient shortness of breath and lightheadedness.  Patient was found to have extensive left lower extremity DVTs as well as pulmonary embolism and admitted to the hospital with vascular surgery consultation and on heparin infusion.    # Submassive pulmonary embolism with mild right ventricular strain on CT Left lower extremity femo-iliac DVT, near occlusion   Initially admitted to ICU.  Started on heparin infusion. Echocardiogram with normal ejection fraction, right ventricular systolic function is normal Mechanical thrombectomy done due to large clot burden inferior vena cava with improvement of symptoms. DVT and pulmonary embolism likely provoked by recent COVID-19 infection and aggravated by car ride. Given submassive PE and third episode of thromboembolism, she would likely benefit with lifelong anticoagulation. Patient was given different anticoagulation choices and she was comfortable taking Eliquis that she had done before.  Patient was discharged with Eliquis. Compliant with stockings on the left lower extremity.  Vascular surgery to schedule outpatient follow-up. Given need for long-term anticoagulation, will send referral to hematology for follow-up. Medically stable for  discharge.   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)   Acute deep vein thrombosis (DVT) of femoral vein of left lower extremity [facility])    Discharge Instructions  Discharge Instructions     Call MD for:  severe uncontrolled pain   Complete by: As directed    Diet - low sodium heart healthy   Complete by: As directed    Increase activity slowly   Complete by: As directed       Allergies as of 08/20/2022       Reactions   Penicillins Itching, Rash   Adhesive [tape] Other (See Comments)   Band-Aid causes blisters   Paxlovid [nirmatrelvir-ritonavir]    Severe allergic reaction        Medication List     STOP taking these medications    ibuprofen 200 MG tablet Commonly known as: ADVIL       TAKE these medications    acetaminophen 500 MG tablet Commonly known as: TYLENOL Take 500 mg by mouth every 6 (six) hours as needed for mild pain or moderate pain.   B-12 PO Take 1 tablet by mouth daily.   Comirnaty syringe Generic drug: COVID-19 mRNA vaccine 2023-2024 Inject into the muscle.   Eliquis DVT/PE Starter Pack Generic drug: Apixaban Starter Pack (10mg  and 5mg ) Take as directed on package: start with two-5mg  tablets twice daily for 7 days. On day 8, switch to one-5mg  tablet twice daily.   Emergen-C Immune Pack Take 0.5 packets by mouth daily.   EPINEPHrine 0.3 mg/0.3 mL Soaj injection Commonly known as: EpiPen 2-Pak Inject 0.3 mg into the muscle as needed for anaphylaxis.   loratadine 5 MG chewable tablet Commonly known as: CLARITIN Chew 5 mg by mouth  daily.   multivitamin tablet Take 1 tablet by mouth daily.   Vitamin D3 125 MCG (5000 UT) Caps Take 5,000 Units by mouth daily.        Allergies  Allergen Reactions   Penicillins Itching and Rash   Adhesive [Tape] Other (See Comments)    Band-Aid causes blisters   Paxlovid  [Nirmatrelvir-Ritonavir]     Severe allergic reaction    Consultations: Vascular surgery Critical care   Procedures/Studies: PERIPHERAL VASCULAR CATHETERIZATION  Result Date: 08/19/2022 Images from the original result were not included. Patient name: Kathryn Pineda MRN: [id number] DOB: December 03, 1955 Sex: female 08/16/2022 - 08/19/2022 Pre-operative Diagnosis: Left lower extremity iliofemoral deep venous thrombosis Post-operative diagnosis:  Same Surgeon:  Victorino Sparrow, MD Procedure Performed: 1.  Ultrasound-guided micropuncture access of the left popliteal vein 2.  Iliocaval venogram, left lower extremity venogram 3.  Intravascular ultrasound inferior vena cava, left common iliac, external iliac vein, common femoral vein, femoral vein 4.  Percutaneous mechanical thrombectomy-Inari clot Treiver 5.  Moderate sedation time 43 minutes, contrast volume 60 mL Indications: Patient is a 67 year old female with previous history of right-sided deep venous thrombosis, superficial venous thrombosis, who presented to the hospital with a several day history of left lower extremity pain after recently recovering from COVID, and taking a 4-hour car trip.  Imaging demonstrated iliofemoral deep venous thrombosis extending into the common femoral vein femoral vein and into the tibial vessels. Findings: Patent popliteal vein, patent femoral vein, patent common femoral vein.  Occluded external iliac vein, common femoral vein with clot extending into the distal inferior vena cava. No left common iliac vein compression. No May Thurner physiology.  Procedure:  The patient was identified in the holding area and taken to room 8.  Patient was laid prone on the Cath Lab table.  She was prepped and draped in standard fashion a timeout was performed.  Next, moderate anesthesia was induced using fentanyl and Versed.  An ultrasound-guided micropuncture needle was used to access the left popliteal vein.  This was upsized to an 8 [place] sheath  and diagnostic venography followed.  See above.  Contrast had a difficult time losing beyond the common femoral vein, therefore catheter was used for injections within the external iliac, common iliac and IVC.  Due to poor visualization of the external iliac, common iliac vein and IVC, and moved to intravascular ultrasound.  This demonstrated diffuse clot extending into the inferior vena cava.  The thrombus in the inferior vena cava caused only partial occlusion.  At this point, I elected to move forward with percutaneous mechanical thrombectomy.  The Inari clot Treiver device was brought onto the field and prepped in standard fashion.  The device was run over a Glidewire advantage.  In total, 4 passes were made into the 12:00, 3:00, 6:00, 9:00 positions.  The first 3 passes yielded significant thrombotic return.  The last past was clean.  I then moved to completion venography which demonstrated a widely patent left ilio caval system.  An ultrasound was used to ensure there was no pancaking.  There was no venous compression identified within the ileal caval system. At this point, I elected to terminate the case. The percutaneous access site was managed with a Monocryl suture Fara Olden, MD [facility] Office: [phone]   ECHOCARDIOGRAM COMPLETE  Result Date: 08/17/2022    ECHOCARDIOGRAM REPORT   Patient Name:   Kathryn Pineda Date of Exam: 08/17/2022 Medical Rec #:  [id number]  Height:       68.5 in Accession #:    [id number]    Weight:       215.4 lb Date of Birth:  02-Jul-1955     BSA:          2.121 m Patient Age:    67 years      BP:           129/65 mmHg Patient Gender: F             HR:           77 bpm. Exam Location:  Inpatient Procedure: 2D Echo, Cardiac Doppler and Color Doppler Indications:    Pulmonary Embolus I26.09  History:        Patient has no prior history of Echocardiogram examinations.  Sonographer:    Lucendia Herrlich Referring Phys: [id number] Jonny Ruiz D PAYNE  IMPRESSIONS  1. Left ventricular ejection fraction, by estimation, is 60 to 65%. The left ventricle has normal function. The left ventricle has no regional wall motion abnormalities. Left ventricular diastolic parameters are consistent with Grade I diastolic dysfunction (impaired relaxation).  2. Right ventricular systolic function is normal. The right ventricular size is normal. There is normal pulmonary artery systolic pressure. The estimated right ventricular systolic pressure is 23.4 mmHg.  3. The mitral valve is grossly normal. Trivial mitral valve regurgitation.  4. The aortic valve is tricuspid. Aortic valve regurgitation is not visualized. Aortic valve sclerosis is present, with no evidence of aortic valve stenosis.  5. The inferior vena cava is normal in size with greater than 50% respiratory variability, suggesting right atrial pressure of 3 mmHg. Comparison(s): No prior Echocardiogram. No evidence for RV strain. FINDINGS  Left Ventricle: Left ventricular ejection fraction, by estimation, is 60 to 65%. The left ventricle has normal function. The left ventricle has no regional wall motion abnormalities. The left ventricular internal cavity size was normal in size. There is  no left ventricular hypertrophy. Left ventricular diastolic parameters are consistent with Grade I diastolic dysfunction (impaired relaxation). Normal left ventricular filling pressure. Right Ventricle: The right ventricular size is normal. No increase in right ventricular wall thickness. Right ventricular systolic function is normal. There is normal pulmonary artery systolic pressure. The tricuspid regurgitant velocity is 2.26 m/s, and  with an assumed right atrial pressure of 3 mmHg, the estimated right ventricular systolic pressure is 23.4 mmHg. Left Atrium: Left atrial size was normal in size. Right Atrium: Right atrial size was normal in size. Pericardium: There is no evidence of pericardial effusion. Mitral Valve: The mitral valve  is grossly normal. Trivial mitral valve regurgitation. Tricuspid Valve: The tricuspid valve is grossly normal. Tricuspid valve regurgitation is trivial. Aortic Valve: The aortic valve is tricuspid. Aortic valve regurgitation is not visualized. Aortic valve sclerosis is present, with no evidence of aortic valve stenosis. Aortic valve peak gradient measures 11.3 mmHg. Pulmonic Valve: The pulmonic valve was normal in structure. Pulmonic valve regurgitation is not visualized. Aorta: The aortic root and ascending aorta are structurally normal, with no evidence of dilitation. Venous: The inferior vena cava is normal in size with greater than 50% respiratory variability, suggesting right atrial pressure of 3 mmHg. IAS/Shunts: No atrial level shunt detected by color flow Doppler.  LEFT VENTRICLE PLAX 2D LVIDd:         4.10 cm   Diastology LVIDs:         2.90 cm   LV e' medial:    9.01 cm/s LV PW:  1.00 cm   LV E/e' medial:  7.4 LV IVS:        1.00 cm   LV e' lateral:   10.90 cm/s LVOT diam:     2.20 cm   LV E/e' lateral: 6.1 LV SV:         75 LV SV Index:   35 LVOT Area:     3.80 cm  RIGHT VENTRICLE             IVC RV S prime:     19.80 cm/s  IVC diam: 1.30 cm TAPSE (M-mode): 2.2 cm LEFT ATRIUM             Index        RIGHT ATRIUM           Index LA diam:        3.80 cm 1.79 cm/m   RA Area:     13.50 cm LA Vol (A2C):   57.8 ml 27.26 ml/m  RA Volume:   29.60 ml  13.96 ml/m LA Vol (A4C):   53.0 ml 24.97 ml/m LA Biplane Vol: 58.7 ml 27.68 ml/m  AORTIC VALVE AV Area (Vmax): 2.43 cm AV Vmax:        168.00 cm/s AV Peak Grad:   11.3 mmHg LVOT Vmax:      107.33 cm/s LVOT Vmean:     68.333 cm/s LVOT VTI:       0.198 m  AORTA Ao Root diam: 3.20 cm Ao Asc diam:  3.30 cm MITRAL VALVE               TRICUSPID VALVE MV Area (PHT): 3.60 cm    TR Peak grad:   20.4 mmHg MV Decel Time: 211 msec    TR Vmax:        226.00 cm/s MV E velocity: 66.90 cm/s MV A velocity: 95.60 cm/s  SHUNTS MV E/A ratio:  0.70        Systemic VTI:   0.20 m                            Systemic Diam: 2.20 cm Zoila Shutter MD Electronically signed by Zoila Shutter MD Signature Date/Time: 08/17/2022/10:42:32 AM    Final    CT Angio Chest PE W and/or Wo Contrast  Addendum Date: 08/16/2022   ADDENDUM REPORT: 08/16/2022 18:02 ADDENDUM: These results were called by telephone at the time of interpretation on 08/16/2022 at 6:02 pm to provider [facility] , who verbally acknowledged these results. Electronically Signed   By: Helyn Numbers M.D.   On: 08/16/2022 18:02   Result Date: 08/16/2022 CLINICAL DATA:  Pulmonary embolism (PE) suspected, high prob; Abdominal pain, acute, nonlocalized large DVT, chest pain, occ abd, evaluate extention of clot., Left leg pain, DVT, chest pain. EXAM: CT ANGIOGRAPHY CHEST CT ABDOMEN AND PELVIS WITH CONTRAST TECHNIQUE: Multidetector CT imaging of the chest was performed using the standard protocol during bolus administration of intravenous contrast. Multiplanar CT image reconstructions and MIPs were obtained to evaluate the vascular anatomy. Multidetector CT imaging of the abdomen and pelvis was performed using the standard protocol during bolus administration of intravenous contrast. RADIATION DOSE REDUCTION: This exam was performed according to the departmental dose-optimization program which includes automated exposure control, adjustment of the mA and/or kV according to patient size and/or use of iterative reconstruction technique. CONTRAST:  OMNIPAQUE IOHEXOL 350 MG/ML SOLN COMPARISON:  04/02/2022, 05/30/2009 FINDINGS: CTA CHEST FINDINGS Cardiovascular:  There is adequate opacification the pulmonary arterial tree. There are multiple segmental arterial intraluminal filling defects in keeping with acute pulmonary embolism involving branches within the right upper lobe, right middle lobe, and right lower lobe. The embolic burden is moderate. The central pulmonary arteries are of normal caliber. The right ventricle is dilated,  however, there is slight septal shift to the left, and there is inversion of the normal RV/LV ratio (RV/LV = 1.21) in keeping with changes of right heart strain. Global cardiac size is within normal limits. No significant coronary artery calcification. No pericardial effusion. Minimal atherosclerotic calcification within the thoracic aorta. No aortic aneurysm. Mediastinum/Nodes: Visualized thyroid is unremarkable. No pathologic thoracic adenopathy. Mild circumferential wall thickening involving distal esophagus may reflect changes of esophagitis but is nonspecific. The esophagus is otherwise unremarkable. Lungs/Pleura: Mild bilateral dependent atelectasis. The lungs are otherwise clear. No pneumothorax or pleural effusion. Musculoskeletal: No acute bone abnormality. No lytic or blastic bone lesion. Review of the MIP images confirms the above findings. CT ABDOMEN and PELVIS FINDINGS Hepatobiliary: No focal liver abnormality is seen. Status post cholecystectomy. No biliary dilatation. Pancreas: Unremarkable Spleen: Unremarkable Adrenals/Urinary Tract: No adrenal hemorrhage or renal injury identified. Bladder is unremarkable. Stomach/Bowel: Gastric lap band device is in expected position with the reservoir seen within the right mid abdominal anterior abdominal wall the stomach, small bowel, and large bowel are otherwise unremarkable. No evidence of obstruction or focal inflammation. Appendix normal. No free intraperitoneal gas or fluid. Vascular/Lymphatic: The left common femoral, external iliac, internal iliac, and common iliac veins are acutely thrombosed with expansion and mild perivascular inflammatory stranding. There is, additionally, a small amount of free-floating thrombus extending into the confluence of the inferior vena cava best seen on axial image # 51/2. This thrombus measures roughly 0.8 x 1.3 x 5.3 cm (volume = 3 cm^3). Mild aortoiliac atherosclerotic calcification. No aortic aneurysm. No pathologic  adenopathy within the abdomen and pelvis. Reproductive: Status post hysterectomy. No adnexal masses. Other: No abdominal wall hernia or abnormality. No abdominopelvic ascites. Musculoskeletal: Osseous structures are age-appropriate. No acute bone abnormality. No lytic or blastic bone lesion. Review of the MIP images confirms the above findings. IMPRESSION: 1. Acute pulmonary embolism with moderate embolic burden and CT evidence of right heart strain as described above. (RV/LV Ratio = 1.21) consistent with at least submassive (intermediate risk) PE. The presence of right heart strain has been associated with an increased risk of morbidity and mortality. Please refer to the "Code PE Focused" order set in EPIC. 2. Acute occlusive DVT involving the left common femoral, external iliac, internal iliac, and common iliac veins. Small amount of free-floating thrombus extending into the confluence of the inferior vena cava. 3. Mild circumferential wall thickening involving the distal esophagus may reflect changes of esophagitis but is nonspecific. 4. Gastric lap band device in expected position. Aortic Atherosclerosis (ICD10-I70.0). Electronically Signed: By: Helyn Numbers M.D. On: 08/16/2022 17:55   CT ABDOMEN PELVIS W CONTRAST  Addendum Date: 08/16/2022   ADDENDUM REPORT: 08/16/2022 18:02 ADDENDUM: These results were called by telephone at the time of interpretation on 08/16/2022 at 6:02 pm to provider [facility] , who verbally acknowledged these results. Electronically Signed   By: Helyn Numbers M.D.   On: 08/16/2022 18:02   Result Date: 08/16/2022 CLINICAL DATA:  Pulmonary embolism (PE) suspected, high prob; Abdominal pain, acute, nonlocalized large DVT, chest pain, occ abd, evaluate extention of clot., Left leg pain, DVT, chest pain. EXAM: CT ANGIOGRAPHY CHEST CT ABDOMEN AND PELVIS  WITH CONTRAST TECHNIQUE: Multidetector CT imaging of the chest was performed using the standard protocol during bolus  administration of intravenous contrast. Multiplanar CT image reconstructions and MIPs were obtained to evaluate the vascular anatomy. Multidetector CT imaging of the abdomen and pelvis was performed using the standard protocol during bolus administration of intravenous contrast. RADIATION DOSE REDUCTION: This exam was performed according to the departmental dose-optimization program which includes automated exposure control, adjustment of the mA and/or kV according to patient size and/or use of iterative reconstruction technique. CONTRAST:  OMNIPAQUE IOHEXOL 350 MG/ML SOLN COMPARISON:  04/02/2022, 05/30/2009 FINDINGS: CTA CHEST FINDINGS Cardiovascular: There is adequate opacification the pulmonary arterial tree. There are multiple segmental arterial intraluminal filling defects in keeping with acute pulmonary embolism involving branches within the right upper lobe, right middle lobe, and right lower lobe. The embolic burden is moderate. The central pulmonary arteries are of normal caliber. The right ventricle is dilated, however, there is slight septal shift to the left, and there is inversion of the normal RV/LV ratio (RV/LV = 1.21) in keeping with changes of right heart strain. Global cardiac size is within normal limits. No significant coronary artery calcification. No pericardial effusion. Minimal atherosclerotic calcification within the thoracic aorta. No aortic aneurysm. Mediastinum/Nodes: Visualized thyroid is unremarkable. No pathologic thoracic adenopathy. Mild circumferential wall thickening involving distal esophagus may reflect changes of esophagitis but is nonspecific. The esophagus is otherwise unremarkable. Lungs/Pleura: Mild bilateral dependent atelectasis. The lungs are otherwise clear. No pneumothorax or pleural effusion. Musculoskeletal: No acute bone abnormality. No lytic or blastic bone lesion. Review of the MIP images confirms the above findings. CT ABDOMEN and PELVIS FINDINGS  Hepatobiliary: No focal liver abnormality is seen. Status post cholecystectomy. No biliary dilatation. Pancreas: Unremarkable Spleen: Unremarkable Adrenals/Urinary Tract: No adrenal hemorrhage or renal injury identified. Bladder is unremarkable. Stomach/Bowel: Gastric lap band device is in expected position with the reservoir seen within the right mid abdominal anterior abdominal wall the stomach, small bowel, and large bowel are otherwise unremarkable. No evidence of obstruction or focal inflammation. Appendix normal. No free intraperitoneal gas or fluid. Vascular/Lymphatic: The left common femoral, external iliac, internal iliac, and common iliac veins are acutely thrombosed with expansion and mild perivascular inflammatory stranding. There is, additionally, a small amount of free-floating thrombus extending into the confluence of the inferior vena cava best seen on axial image # 51/2. This thrombus measures roughly 0.8 x 1.3 x 5.3 cm (volume = 3 cm^3). Mild aortoiliac atherosclerotic calcification. No aortic aneurysm. No pathologic adenopathy within the abdomen and pelvis. Reproductive: Status post hysterectomy. No adnexal masses. Other: No abdominal wall hernia or abnormality. No abdominopelvic ascites. Musculoskeletal: Osseous structures are age-appropriate. No acute bone abnormality. No lytic or blastic bone lesion. Review of the MIP images confirms the above findings. IMPRESSION: 1. Acute pulmonary embolism with moderate embolic burden and CT evidence of right heart strain as described above. (RV/LV Ratio = 1.21) consistent with at least submassive (intermediate risk) PE. The presence of right heart strain has been associated with an increased risk of morbidity and mortality. Please refer to the "Code PE Focused" order set in EPIC. 2. Acute occlusive DVT involving the left common femoral, external iliac, internal iliac, and common iliac veins. Small amount of free-floating thrombus extending into the  confluence of the inferior vena cava. 3. Mild circumferential wall thickening involving the distal esophagus may reflect changes of esophagitis but is nonspecific. 4. Gastric lap band device in expected position. Aortic Atherosclerosis (ICD10-I70.0). Electronically Signed: By: Gloris Ham  Ramiro Harvest M.D. On: 08/16/2022 17:55   CT Head Wo Contrast  Result Date: 08/16/2022 CLINICAL DATA:  Headache EXAM: CT HEAD WITHOUT CONTRAST TECHNIQUE: Contiguous axial images were obtained from the base of the skull through the vertex without intravenous contrast. RADIATION DOSE REDUCTION: This exam was performed according to the departmental dose-optimization program which includes automated exposure control, adjustment of the mA and/or kV according to patient size and/or use of iterative reconstruction technique. COMPARISON:  08/03/2017 FINDINGS: Brain: No evidence of acute infarction, hemorrhage, hydrocephalus, extra-axial collection or mass lesion/mass effect. Vascular: No hyperdense vessel or unexpected calcification. Skull: Normal. Negative for fracture or focal lesion. Sinuses/Orbits: No acute finding. Other: None. IMPRESSION: No acute intracranial pathology. Electronically Signed   By: Jearld Lesch M.D.   On: 08/16/2022 18:00   US Venous Img Lower  Left (DVT Study)  Result Date: 08/16/2022 CLINICAL DATA:  Left calf and buttock pain for 1 week EXAM: LEFT LOWER EXTREMITY VENOUS DOPPLER ULTRASOUND TECHNIQUE: Gray-scale sonography with graded compression, as well as color Doppler and duplex ultrasound were performed to evaluate the lower extremity deep venous systems from the level of the common femoral vein and including the common femoral, femoral, profunda femoral, popliteal and calf veins including the posterior tibial, peroneal and gastrocnemius veins when visible. The superficial great saphenous vein was also interrogated. Spectral Doppler was utilized to evaluate flow at rest and with distal augmentation maneuvers in  the common femoral, femoral and popliteal veins. COMPARISON:  None Available. FINDINGS: Contralateral Common Femoral Vein: Respiratory phasicity is normal and symmetric with the symptomatic side. No evidence of thrombus. Normal compressibility. There is nearly occlusive thrombus involving the left common femoral vein, proximal left profunda vein, left femoral vein, left popliteal vein and the visualized calf veins. There is also thrombus seen within the superficial left greater saphenous vein. Venous Reflux:  None. Other Findings: Provided static images of the left iliac vein (unclear exact level based on image annotation) also demonstrate occlusive thrombus. The inferior vena cava is not well-visualized. IMPRESSION: Extensive occlusive deep vein thrombosis of the left lower extremity, visualized most centrally at the level of the left iliac vein and extending to the level of the calf veins. The IVC is not well visualized, however thrombus at this level is not excluded. Recommend further assessment with CT abdomen and pelvis venogram protocol to assess for possible central extension of thrombus. These findings were discussed with Ralph Leyden, PA by Dr. Jacob Moores at 3:05 pm on 08/16/2022. Electronically Signed   By: Jacob Moores M.D.   On: 08/16/2022 15:10   (Echo, Carotid, EGD, Colonoscopy, ERCP)    Subjective: Patient seen in the morning rounds along with the surgery team.  Denies any complaints.   Discharge Exam: Vitals:   08/20/22 0610 08/20/22 0802  BP:  126/73  Pulse:  84  Resp: (!) 24 (!) 26  Temp:  98.3 F (36.8 C)  SpO2:  97%   Vitals:   08/19/22 1208 08/19/22 2251 08/20/22 0610 08/20/22 0802  BP: 121/60 128/65  126/73  Pulse: 62 76  84  Resp: 11 16 (!) 24 (!) 26  Temp: 97.9 F (36.6 C) 98.2 F (36.8 C)  98.3 F (36.8 C)  TempSrc: Oral Oral  Oral  SpO2: 98% 100%  97%  Weight:   96.9 kg   Height:        General: Pt is alert, awake, not in acute  distress Cardiovascular: RRR, S1/S2 +, no rubs, no gallops Respiratory: CTA bilaterally, no  wheezing, no rhonchi Abdominal: Soft, NT, ND, bowel sounds + Extremities:  She has 1+ edema left lower extremity, surgical incisions are clean and intact on the popliteal area.  Distal vasculature is intact.    The results of significant diagnostics from this hospitalization (including imaging, microbiology, ancillary and laboratory) are listed below for reference.     Microbiology: Recent Results (from the past 240 hour(s))  MRSA Next Gen by PCR, Nasal     Status: None   Collection Time: 08/16/22  7:56 PM   Specimen: Nasal Mucosa; Nasal Swab  Result Value Ref Range Status   MRSA by PCR Next Gen NOT DETECTED NOT DETECTED Final    Comment: (NOTE) The GeneXpert MRSA Assay (FDA approved for NASAL specimens only), is one component of a comprehensive MRSA colonization surveillance program. It is not intended to diagnose MRSA infection nor to guide or monitor treatment for MRSA infections. Test performance is not FDA approved in patients less than 47 years old. Performed at [facility], [address], [place], [place] [address]      Labs: BNP (last 3 results) Recent Labs    08/16/22 1505  BNP 50.1   Basic Metabolic Panel: Recent Labs  Lab 08/16/22 1505 08/17/22 0826 08/18/22 0123 08/19/22 0059  NA 137 137 139 140  K 3.6 3.9 3.6 3.5  CL 104 103 108 108  CO2 23 25 25 24   GLUCOSE 116* 178* 113* 124*  BUN 16 7* 6* 6*  CREATININE 0.71 0.61 0.59 0.61  CALCIUM 9.1 8.6* 8.4* 8.8*  MG  --   --   --  2.1  PHOS  --   --   --  3.7   Liver Function Tests: Recent Labs  Lab 08/16/22 1505 08/19/22 0059  AST 19 14*  ALT 14 13  ALKPHOS 59 48  BILITOT 0.7 0.5  PROT 7.0 5.8*  ALBUMIN 3.9 3.1*   No results for input(s): "LIPASE", "AMYLASE" in the last 168 hours. No results for input(s): "AMMONIA" in the last 168 hours. CBC: Recent Labs  Lab 08/16/22 1505 08/16/22 1802  08/17/22 0826 08/18/22 0123 08/19/22 0059 08/20/22 0050  WBC 8.3 7.7 8.1 7.3 7.5 6.3  NEUTROABS 6.3  --   --  5.0 5.2  --   HGB 12.7 11.7* 12.3 11.9* 12.0 11.1*  HCT 37.7 34.6* 37.3 36.4 36.4 33.2*  MCV 94.0 94.8 94.4 95.0 94.8 93.5  PLT 156 125* 145* 157 173 168   Cardiac Enzymes: No results for input(s): "CKTOTAL", "CKMB", "CKMBINDEX", "TROPONINI" in the last 168 hours. BNP: Invalid input(s): "POCBNP" CBG: Recent Labs  Lab 08/16/22 1935  GLUCAP 100*   D-Dimer No results for input(s): "DDIMER" in the last 72 hours. Hgb A1c No results for input(s): "HGBA1C" in the last 72 hours. Lipid Profile No results for input(s): "CHOL", "HDL", "LDLCALC", "TRIG", "CHOLHDL", "LDLDIRECT" in the last 72 hours. Thyroid function studies No results for input(s): "TSH", "T4TOTAL", "T3FREE", "THYROIDAB" in the last 72 hours.  Invalid input(s): "FREET3" Anemia work up Recent Labs    08/19/22 0059  VITAMINB12 379  FOLATE 10.7  FERRITIN 115  TIBC 304  IRON 37  RETICCTPCT 1.6   Urinalysis    Component Value Date/Time   BILIRUBINUR negative 03/23/2022 0945   BILIRUBINUR neg 07/15/2021 1110   KETONESUR negative 03/23/2022 0945   PROTEINUR negative 03/23/2022 0945   PROTEINUR Negative 07/15/2021 1110   UROBILINOGEN 0.2 03/23/2022 0945   NITRITE Negative 03/23/2022 0945   NITRITE neg 07/15/2021  1110   LEUKOCYTESUR Negative 03/23/2022 0945   Sepsis Labs Recent Labs  Lab 08/17/22 0826 08/18/22 0123 08/19/22 0059 08/20/22 0050  WBC 8.1 7.3 7.5 6.3   Microbiology Recent Results (from the past 240 hour(s))  MRSA Next Gen by PCR, Nasal     Status: None   Collection Time: 08/16/22  7:56 PM   Specimen: Nasal Mucosa; Nasal Swab  Result Value Ref Range Status   MRSA by PCR Next Gen NOT DETECTED NOT DETECTED Final    Comment: (NOTE) The GeneXpert MRSA Assay (FDA approved for NASAL specimens only), is one component of a comprehensive MRSA colonization surveillance program. It is not  intended to diagnose MRSA infection nor to guide or monitor treatment for MRSA infections. Test performance is not FDA approved in patients less than 36 years old. Performed at [facility], [address], [place], [place] [address]      Time coordinating discharge: 35 minutes  SIGNED:   Dorcas Carrow, MD  [facility] 08/20/2022, 8:51 AM

## 2022-08-20 NOTE — Plan of Care (Signed)
  Problem: Clinical Measurements: Goal: Will remain free from infection Outcome: Progressing Goal: Respiratory complications will improve Outcome: Progressing Goal: Cardiovascular complication will be avoided Outcome: Progressing   

## 2022-08-20 NOTE — Progress Notes (Addendum)
  Progress Note    08/20/2022 10:12 AM 1 Day Post-Op  Subjective:  ambulating in room, left leg greatly improved   Vitals:   08/20/22 0610 08/20/22 0802  BP:  126/73  Pulse:  84  Resp: (!) 24 (!) 26  Temp:  98.3 F (36.8 C)  SpO2:  97%   Physical Exam: Cardiac:  regular Lungs:  non labored Incisions:  left leg popliteal access site dressings c/d/I. ACE wrap reapplied Extremities:  well perfused and warm with palpable DP Abdomen:  soft Neurologic: alert and oriented  CBC    Component Value Date/Time   WBC 6.3 08/20/2022 0050   RBC 3.55 (L) 08/20/2022 0050   HGB 11.1 (L) 08/20/2022 0050   HGB 12.9 04/24/2019 0938   HGB 13.0 03/06/2016 1200   HCT 33.2 (L) 08/20/2022 0050   PLT 168 08/20/2022 0050   PLT 235 04/24/2019 0938   MCV 93.5 08/20/2022 0050   MCV 94.7 02/24/2014 1336   MCH 31.3 08/20/2022 0050   MCHC 33.4 08/20/2022 0050   RDW 13.4 08/20/2022 0050   LYMPHSABS 1.1 08/19/2022 0059   MONOABS 0.8 08/19/2022 0059   EOSABS 0.2 08/19/2022 0059   BASOSABS 0.1 08/19/2022 0059    BMET    Component Value Date/Time   NA 140 08/19/2022 0059   K 3.5 08/19/2022 0059   CL 108 08/19/2022 0059   CO2 24 08/19/2022 0059   GLUCOSE 124 (H) 08/19/2022 0059   BUN 6 (L) 08/19/2022 0059   CREATININE 0.61 08/19/2022 0059   CREATININE 0.83 04/24/2019 0938   CREATININE 0.77 03/06/2016 1203   CALCIUM 8.8 (L) 08/19/2022 0059   GFRNONAA >60 08/19/2022 0059   GFRNONAA >60 04/24/2019 0938   GFRAA >60 04/24/2019 0938    INR    Component Value Date/Time   INR 0.93 11/18/2016 1220     Intake/Output Summary (Last 24 hours) at 08/20/2022 1012 Last data filed at 08/20/2022 0806 Gross per 24 hour  Intake 1500.24 ml  Output 700 ml  Net 800.24 ml     Assessment/Plan:  67 y.o. female is s/p percutaneous mechanical thrombectomy of external iliac vein, common femoral vein with clot extending into the distal inferior vena cava.  1 Day Post-Op   Successful recanalization of LLE  venous system Left leg is well perfused and warm. Swelling greatly improved post op Dressings taken down from LLE will transition to LLE compression stocking Transition from Heparin gtt to Apixaban. Likely needs this lifelong. She will see Hematology for follow up as outpatient Stable for discharge from vascular standpoint She will follow up in 4-6 weeks with aorta/ivc/iliac duplex and LLE venous duplex   Graceann Congress, PA-C [facility] [phone] 08/20/2022 10:12 AM   I have interviewed and examined patient with PA and agree with assessment and plan above.  Transition to Eliquis and will need 6 months for recent PE but this is her third episode so is likely looking at indefinite anticoagulation and we discussed with hospitalist referral back to hematology for definitive management.  She will follow-up with [place] as noted above.  Mikell Camp C. Randie Heinz, MD [facility] [place] Office: [phone] Pager: [phone]

## 2022-08-20 NOTE — Progress Notes (Signed)
ANTICOAGULATION CONSULT NOTE- Follow Up  Pharmacy Consult for Heparin Indication: DVT and PE   Allergies  Allergen Reactions   Penicillins Itching and Rash   Adhesive [Tape] Other (See Comments)    Band-Aid causes blisters   Paxlovid [Nirmatrelvir-Ritonavir]     Severe allergic reaction    Patient Measurements: Height: 5' 8.5" (174 cm) Weight: 96.9 kg (213 lb 9.6 oz) IBW/kg (Calculated) : 65.05 Heparin Dosing Weight: 86.2 kg  Vital Signs: Temp: 98.2 F (36.8 C) (07/03 2251) Temp Source: Oral (07/03 2251) BP: 128/65 (07/03 2251) Pulse Rate: 76 (07/03 2251)  Labs: Recent Labs    08/17/22 0826 08/17/22 1536 08/18/22 0123 08/19/22 0059 08/20/22 0050  HGB 12.3  --  11.9* 12.0 11.1*  HCT 37.3  --  36.4 36.4 33.2*  PLT 145*  --  157 173 168  HEPARINUNFRC 0.78*   < > 0.51 0.39 0.47  CREATININE 0.61  --  0.59 0.61  --    < > = values in this interval not displayed.     Estimated Creatinine Clearance: 86.1 mL/min (by C-G formula based on SCr of 0.61 mg/dL).   Assessment: 67 yo F presents with extensive occlusive DVT of LLE from L iliac vein to the level of the calf veins and moderate submassive PE with RHS. Hx of DVT in 2020 and 2021. Previously on rivaroxaban x3 months in 2021 and x3 months in 2022 for leg swelling w no confirmed DVT.  Pharmacy consulted to dose heparin.  Heparin level 0.39 is therapeutic on 1200units/hr. Heparin level has trended down while on 1200units/hr, will increase dose slightly to keep heparin level in goal. Hypercoag labs pending.  Planned mechanical thrombectomy 7/3.  7/4 AM update: HL 0.47 PLT 168K Hgb 11.1 No signs of bleeding or issues with hep gtt   Goal of Therapy:  Heparin level 0.3-0.7 units/ml Monitor platelets by anticoagulation protocol: Yes   Plan:  Continue heparin 1250 units/hr Monitor daily CBC, heparin levels, and for s/sx of bleeding F/U transition to apixaban after thrombectomy  [name] BS, PharmD,  BCPS Clinical Pharmacist 08/20/2022 7:43 AM  Contact: [phone] after 3 PM  "Be curious, not judgmental..." -Debbora Dus

## 2022-08-20 NOTE — Progress Notes (Signed)
ANTICOAGULATION CONSULT NOTE- Follow Up  Pharmacy Consult for transition Heparin >> apixaban Indication: DVT and PE   Allergies  Allergen Reactions   Penicillins Itching and Rash   Adhesive [Tape] Other (See Comments)    Band-Aid causes blisters   Paxlovid [Nirmatrelvir-Ritonavir]     Severe allergic reaction    Patient Measurements: Height: 5' 8.5" (174 cm) Weight: 96.9 kg (213 lb 9.6 oz) IBW/kg (Calculated) : 65.05 Heparin Dosing Weight: 86.2 kg  Vital Signs: Temp: 98.3 F (36.8 C) (07/04 0802) Temp Source: Oral (07/04 0802) BP: 126/73 (07/04 0802) Pulse Rate: 84 (07/04 0802)  Labs: Recent Labs    08/18/22 0123 08/19/22 0059 08/20/22 0050  HGB 11.9* 12.0 11.1*  HCT 36.4 36.4 33.2*  PLT 157 173 168  HEPARINUNFRC 0.51 0.39 0.47  CREATININE 0.59 0.61  --      Estimated Creatinine Clearance: 86.1 mL/min (by C-G formula based on SCr of 0.61 mg/dL).   Assessment: 67 yo F presents with extensive occlusive DVT of LLE from L iliac vein to the level of the calf veins and moderate submassive PE with RHS. Hx of DVT in 2020 and 2021. Previously on rivaroxaban x3 months in 2021 and x3 months in 2022 for leg swelling w no confirmed DVT.  Pharmacy consulted to dose heparin.  Heparin level 0.39 is therapeutic on 1200units/hr. Heparin level has trended down while on 1200units/hr, will increase dose slightly to keep heparin level in goal. Hypercoag labs pending.  Planned mechanical thrombectomy 7/3.  7/4 AM update: HL 0.47 PLT 168K Hgb 11.1 No signs of bleeding or issues with hep gtt   Goal of Therapy:  Monitor platelets by anticoagulation protocol: Yes   Plan:  DC heparin infusion Give apixaban 10 mg po bid x7 days f/b 5 mg po bid thereafter Monitor for s/s bleeding Pharmacy will sign off consult   Kathryn Pineda BS, PharmD, BCPS Clinical Pharmacist 08/20/2022 9:37 AM  Contact: [phone] after 3 PM  "Be curious, not judgmental..." -Kathryn Pineda

## 2022-08-20 NOTE — Progress Notes (Signed)
Mobility Specialist Progress Note:   08/20/22 1231  Mobility  Activity Ambulated with assistance in hallway  Level of Assistance Contact guard assist, steadying assist  Assistive Device None  Distance Ambulated (ft) 100 ft  Activity Response Tolerated well  Mobility Referral Yes  $Mobility charge 1 Mobility  Mobility Specialist Start Time (ACUTE ONLY) 1200  Mobility Specialist Stop Time (ACUTE ONLY) 1208  Mobility Specialist Time Calculation (min) (ACUTE ONLY) 8 min    Pt received in bed, eager to mobility. Independent for bed mobility. Ambulated in hallway w/ CG, no AD. C/o of mild lightheadedness and L knee pain. Otherwise asymptomatic. Pt left ambulating in room with call bell near and family present.  [name] Mobility Specialist Please contact via Special educational needs teacher or Rehab office at [phone]

## 2022-08-21 ENCOUNTER — Encounter (HOSPITAL_COMMUNITY): Payer: Self-pay | Admitting: Vascular Surgery

## 2022-08-21 LAB — LUPUS ANTICOAGULANT PANEL
DRVVT: 41.4 s (ref 0.0–47.0)
PTT Lupus Anticoagulant: 35.6 s (ref 0.0–43.5)

## 2022-08-24 ENCOUNTER — Telehealth: Payer: Self-pay

## 2022-08-24 NOTE — Transitions of Care (Post Inpatient/ED Visit) (Signed)
   08/24/2022  Name: Kathryn Pineda MRN: [id number] DOB: 07-16-1955  Today's TOC FU Call Status: Today's TOC FU Call Status:: Successful TOC FU Call Competed TOC FU Call Complete Date: 08/24/22  Transition Care Management Follow-up Telephone Call Date of Discharge: 08/20/22 Discharge Facility: [facility]) Type of Discharge: Inpatient Admission Primary Inpatient Discharge Diagnosis:: embolism How have you been since you were released from the hospital?: Better Any questions or concerns?: No  Items Reviewed: Did you receive and understand the discharge instructions provided?: Yes Medications obtained,verified, and reconciled?: Yes (Medications Reviewed) Any new allergies since your discharge?: No Dietary orders reviewed?: Yes Do you have support at home?: Yes People in Home: spouse  Medications Reviewed Today: Medications Reviewed Today     Reviewed by Karena Addison, LPN (Licensed Practical Nurse) on 08/24/22 at 1014  Med List Status: <None>   Medication Order Taking? Sig Documenting Provider Last Dose Status Informant  acetaminophen (TYLENOL) 500 MG tablet [id number] Yes Take 500 mg by mouth every 6 (six) hours as needed for mild pain or moderate pain. [provider] Taking Active Self  APIXABAN (ELIQUIS) VTE STARTER PACK (10MG  AND 5MG ) [id number] Yes Take as directed on package: start with two-5mg  tablets twice daily for 7 days. On day 8, switch to one-5mg  tablet twice daily. Baglia, Corrina, PA-C Taking Active   Cholecalciferol (VITAMIN D3) 125 MCG (5000 UT) CAPS [id number] Yes Take 5,000 Units by mouth daily. [provider] Taking Active Self  COVID-19 mRNA vaccine 2023-2024 (COMIRNATY) syringe [id number] Yes Inject into the muscle. Judyann Munson, MD Taking Active Self  Cyanocobalamin (B-12 PO) [id number] Yes Take 1 tablet by mouth daily. [provider] Taking Active Self  EPINEPHrine (EPIPEN 2-PAK) 0.3 mg/0.3 mL IJ SOAJ injection [id number] Yes  Inject 0.3 mg into the muscle as needed for anaphylaxis. Copland, [name], MD Taking Active Self  loratadine (CLARITIN) 5 MG chewable tablet [id number] Yes Chew 5 mg by mouth daily. [provider] Taking Active Self  Multiple Vitamin (MULTIVITAMIN) tablet [id number] Yes Take 1 tablet by mouth daily. [provider] Taking Active Self  Multiple Vitamins-Minerals (EMERGEN-C IMMUNE) PACK [id number] Yes Take 0.5 packets by mouth daily. [provider] Taking Active Self            Home Care and Equipment/Supplies: Were Home Health Services Ordered?: NA Any new equipment or medical supplies ordered?: NA  Functional Questionnaire: Do you need assistance with bathing/showering or dressing?: No Do you need assistance with meal preparation?: No Do you need assistance with eating?: No Do you have difficulty maintaining continence: No Do you need assistance with getting out of bed/getting out of a chair/moving?: No Do you have difficulty managing or taking your medications?: No  Follow up appointments reviewed: PCP Follow-up appointment confirmed?: No (no avail appt times, sent message to staff to schedule) MD Provider Line Number:[phone] Given: No Specialist Hospital Follow-up appointment confirmed?: Yes Date of Specialist follow-up appointment?: 09/25/22 Follow-Up Specialty Provider:: vascular Do you need transportation to your follow-up appointment?: No Do you understand care options if your condition(s) worsen?: Yes-patient verbalized understanding    SIGNATURE Karena Addison, LPN [facility] Nurse Health Advisor Direct Dial [phone]

## 2022-08-28 ENCOUNTER — Ambulatory Visit (INDEPENDENT_AMBULATORY_CARE_PROVIDER_SITE_OTHER): Payer: 59 | Admitting: Family

## 2022-08-28 ENCOUNTER — Encounter: Payer: Self-pay | Admitting: Family

## 2022-08-28 VITALS — BP 110/68 | HR 88 | Ht 68.5 in | Wt 206.0 lb

## 2022-08-28 DIAGNOSIS — I2699 Other pulmonary embolism without acute cor pulmonale: Secondary | ICD-10-CM

## 2022-08-28 LAB — COMPREHENSIVE METABOLIC PANEL
ALT: 18 U/L (ref 0–35)
AST: 15 U/L (ref 0–37)
Albumin: 4.5 g/dL (ref 3.5–5.2)
Alkaline Phosphatase: 55 U/L (ref 39–117)
BUN: 12 mg/dL (ref 6–23)
CO2: 29 mEq/L (ref 19–32)
Calcium: 9.7 mg/dL (ref 8.4–10.5)
Chloride: 105 mEq/L (ref 96–112)
Creatinine, Ser: 0.82 mg/dL (ref 0.40–1.20)
GFR: 75.26 mL/min (ref >60.00)
Glucose, Bld: 110 mg/dL — ABNORMAL HIGH (ref 70–99)
Potassium: 4.4 mEq/L (ref 3.5–5.1)
Sodium: 141 mEq/L (ref 135–145)
Total Bilirubin: 0.7 mg/dL (ref 0.2–1.2)
Total Protein: 7 g/dL (ref 6.0–8.3)

## 2022-08-28 LAB — CBC WITH DIFFERENTIAL/PLATELET
Basophils Absolute: 0.1 10*3/uL (ref 0.0–0.1)
Basophils Relative: 1.1 % (ref 0.0–3.0)
Eosinophils Absolute: 0.1 10*3/uL (ref 0.0–0.7)
Eosinophils Relative: 1.7 % (ref 0.0–5.0)
HCT: 41.8 % (ref 36.0–46.0)
Hemoglobin: 13.8 g/dL (ref 12.0–15.0)
Lymphocytes Relative: 24.7 % (ref 12.0–46.0)
Lymphs Abs: 1.4 10*3/uL (ref 0.7–4.0)
MCHC: 33.1 g/dL (ref 30.0–36.0)
MCV: 94.9 fl (ref 78.0–100.0)
Monocytes Absolute: 0.5 10*3/uL (ref 0.1–1.0)
Monocytes Relative: 8.8 % (ref 3.0–12.0)
Neutro Abs: 3.6 10*3/uL (ref 1.4–7.7)
Neutrophils Relative %: 63.7 % (ref 43.0–77.0)
Platelets: 332 10*3/uL (ref 150.0–400.0)
RBC: 4.41 Mil/uL (ref 3.87–5.11)
RDW: 14.2 % (ref 11.5–15.5)
WBC: 5.7 10*3/uL (ref 4.0–10.5)

## 2022-08-28 NOTE — Progress Notes (Signed)
Kathryn Pineda is a 67 y.o. 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   Acute deep vein thrombosis (DVT) of femoral vein of left lower extremity (HCC) 08/16/2022   Internal hemorrhoids 07/24/2022   Thrombophlebitis of superficial veins of lower extremity 07/24/2022   History of DVT (deep vein thrombosis) 07/24/2022   Viral URI 04/10/2022   Personal history of colonic polyps 07/15/2021   Rectocele 11/13/2016   Incomplete uterine prolapse 11/13/2016   Cystocele, midline 11/13/2016   Seasonal allergies 09/17/2014   Incontinence    Obesity (BMI 30-39.9) 12/24/2010   Hx of laparoscopic gastric banding 09/23/2010    Current Outpatient Medications  Medication Sig Dispense Refill   acetaminophen (TYLENOL) 500 MG tablet Take 500 mg by mouth every 6 (six) hours as needed for mild pain or moderate pain.     APIXABAN (ELIQUIS) VTE STARTER PACK (10MG  AND 5MG ) Take as directed on package: start with two-5mg  tablets twice daily for 7 days. On day 8, switch to one-5mg  tablet twice daily. 74 each 0   Cholecalciferol (VITAMIN D3) 125 MCG (5000 UT) CAPS Take 5,000 Units by mouth daily.     Cyanocobalamin (B-12 PO) Take 1 tablet by mouth daily.     EPINEPHrine (EPIPEN 2-PAK) 0.3 mg/0.3 mL IJ SOAJ injection Inject 0.3 mg into the muscle as needed for anaphylaxis. 2 each PRN   loratadine (CLARITIN) 5 MG chewable tablet Chew 5 mg by mouth daily.     Multiple Vitamin (MULTIVITAMIN) tablet Take 1 tablet by mouth daily.     Multiple Vitamins-Minerals (EMERGEN-C IMMUNE) PACK Take 0.5 packets by mouth daily.     No current facility-administered medications for this visit.    Allergies: Penicillins, Adhesive [tape], and Paxlovid [nirmatrelvir-ritonavir]  Past Medical History:  Diagnosis Date   Anal fissure    Arthritis    Biliary colic  5/96   ERCP w/spincterotomy   Condyloma    Deep vein thrombosis (DVT) of lower extremity (HCC)    Fatigue    loss of sleep   Generalized headaches    GERD (gastroesophageal reflux disease)    Hemorrhoid    Hiatal hernia    Migraines    Obesity    Phlebitis of leg    PONV (postoperative nausea and vomiting)    Primary osteoarthritis of both knees    Stress incontinence    Wilson's disease     Past Surgical History:  Procedure Laterality Date   ABDOMINAL HYSTERECTOMY     ANAL FISSURECTOMY  1986   ANTERIOR AND POSTERIOR REPAIR N/A 11/24/2016   Procedure: ANTERIOR (CYSTOCELE) AND POSTERIOR REPAIR (RECTOCELE);  Surgeon: Alfredo Martinez, MD;  Location: [facility];  Service: Urology;  Laterality: N/A;   BUNIONECTOMY Bilateral 2005   CESAREAN SECTION  1990   prolapsed cord   CYSTOSCOPY N/A 11/24/2016   Procedure: CYSTOSCOPY;  Surgeon: Alfredo Martinez, MD;  Location: [facility];  Service: Urology;  Laterality: N/A;   ENDOVENOUS ABLATION SAPHENOUS VEIN W/ LASER  2008   both lower legs   LAPAROSCOPIC CHOLECYSTECTOMY  1997   LAPAROSCOPIC GASTRIC SLEEVE RESECTION WITH HIATAL HERNIA REPAIR  09/16/2010   LAPAROSCOPIC VAGINAL HYSTERECTOMY WITH SALPINGECTOMY Bilateral 11/24/2016   Procedure: LAPAROSCOPIC ASSISTED VAGINAL HYSTERECTOMY WITH SALPINGO-OOPHORECTOMY;  Surgeon: [name], MD;  Location: [facility];  Service: Gynecology;  Laterality: Bilateral;   LOWER  EXTREMITY VENOGRAPHY  08/19/2022   Procedure: LOWER EXTREMITY VENOGRAPHY;  Surgeon: Victorino Sparrow, MD;  Location: [facility] INVASIVE CV LAB;  Service: Cardiovascular;;   PERIPHERAL VASCULAR THROMBECTOMY N/A 08/19/2022   Procedure: PERIPHERAL VASCULAR THROMBECTOMY;  Surgeon: Victorino Sparrow, MD;  Location: [facility] INVASIVE CV LAB;  Service: Cardiovascular;  Laterality: N/A;   PERIPHERAL VASCULAR ULTRASOUND/IVUS  08/19/2022   Procedure: Peripheral Vascular Ultrasound/IVUS;  Surgeon: Victorino Sparrow, MD;  Location: [facility] INVASIVE CV LAB;  Service:  Cardiovascular;;    Family History  Problem Relation Age of Onset   Diabetes Father    Colon polyps Father    Osteoarthritis Mother    Hypertension Brother    Cancer Maternal Uncle        leukemia   Cancer Maternal Grandmother        unaware    Social History   Tobacco Use   Smoking status: Never   Smokeless tobacco: Never  Substance Use Topics   Alcohol use: Yes    Alcohol/week: 1.0 standard drink of alcohol    Types: 1 Glasses of wine per week    Subjective:   Presents for hospital follow up; patient has a history of a lower extremity DVT in 2020, recent COVID infection 5 weeks and had been feeling fatigue and weakness since that time; presented on 6/30 with worsening left leg pain, transient shortness of breath and lightheadedness. Patient was found to have extensive left lower extremity DVTs as well as pulmonary embolism and admitted to the hospital with vascular surgery consultation and on heparin infusion.   Notes she is feeling much better at this time; is scheduled to see hematology next week and has close follow up with vascular planned- will be having further imaging next month;   Objective:  Vitals:   08/28/22 1111  BP: 110/68  Pulse: 88  SpO2: 96%  Weight: 206 lb (93.4 kg)  Height: 5' 8.5" (1.74 m)    General: Well developed, well nourished, in no acute distress  Skin : Warm and dry.  Head: Normocephalic and atraumatic  Eyes: Sclera and conjunctiva clear; pupils round and reactive to light; extraocular movements intact  Ears: External normal; canals clear; tympanic membranes normal  Oropharynx: Pink, supple. No suspicious lesions  Neck: Supple without thyromegaly, adenopathy  Lungs: Respirations unlabored; clear to auscultation bilaterally without wheeze, rales, rhonchi  CVS exam: normal rate and regular rhythm.  Neurologic: Alert and oriented; speech intact; face symmetrical; moves all extremities well; CNII-XII intact without focal deficit   Assessment:   1. Other acute pulmonary embolism without acute cor pulmonale (HCC)     Plan:  Physical exam is reassuring; patient has no acute concerns today; she understands that Eliquis will most likely be lifelong prescription; she will keep planned follow up with hematology and vascular group; will forward note to make sure her PCP is aware as well; she will tentatively plan to follow up with her PCP in about 3-4 months.   No follow-ups on file.  Orders Placed This Encounter  Procedures   CBC with Differential/Platelet   Comp Met (CMET)    Requested Prescriptions    No prescriptions requested or ordered in this encounter

## 2022-08-31 LAB — PROTHROMBIN GENE MUTATION

## 2022-09-02 ENCOUNTER — Other Ambulatory Visit: Payer: Self-pay

## 2022-09-02 ENCOUNTER — Encounter: Payer: Self-pay | Admitting: Hematology & Oncology

## 2022-09-02 ENCOUNTER — Inpatient Hospital Stay: Payer: 59 | Attending: Hematology & Oncology

## 2022-09-02 ENCOUNTER — Inpatient Hospital Stay: Payer: 59 | Admitting: Hematology & Oncology

## 2022-09-02 VITALS — BP 101/50 | HR 70 | Temp 98.2°F | Resp 18 | Ht 68.5 in | Wt 210.0 lb

## 2022-09-02 DIAGNOSIS — Z7901 Long term (current) use of anticoagulants: Secondary | ICD-10-CM

## 2022-09-02 DIAGNOSIS — I2699 Other pulmonary embolism without acute cor pulmonale: Secondary | ICD-10-CM | POA: Diagnosis present

## 2022-09-02 DIAGNOSIS — I82401 Acute embolism and thrombosis of unspecified deep veins of right lower extremity: Secondary | ICD-10-CM

## 2022-09-02 DIAGNOSIS — Z86718 Personal history of other venous thrombosis and embolism: Secondary | ICD-10-CM

## 2022-09-02 DIAGNOSIS — I82402 Acute embolism and thrombosis of unspecified deep veins of left lower extremity: Secondary | ICD-10-CM | POA: Diagnosis not present

## 2022-09-02 LAB — CBC WITH DIFFERENTIAL (CANCER CENTER ONLY)
Abs Immature Granulocytes: 0.02 10*3/uL (ref 0.00–0.07)
Basophils Absolute: 0.1 10*3/uL (ref 0.0–0.1)
Basophils Relative: 1 %
Eosinophils Absolute: 0.1 10*3/uL (ref 0.0–0.5)
Eosinophils Relative: 1 %
HCT: 38.5 % (ref 36.0–46.0)
Hemoglobin: 12.8 g/dL (ref 12.0–15.0)
Immature Granulocytes: 0 %
Lymphocytes Relative: 14 %
Lymphs Abs: 1.2 10*3/uL (ref 0.7–4.0)
MCH: 31.9 pg (ref 26.0–34.0)
MCHC: 33.2 g/dL (ref 30.0–36.0)
MCV: 96 fL (ref 80.0–100.0)
Monocytes Absolute: 0.8 10*3/uL (ref 0.1–1.0)
Monocytes Relative: 9 %
Neutro Abs: 6.5 10*3/uL (ref 1.7–7.7)
Neutrophils Relative %: 75 %
Platelet Count: 274 10*3/uL (ref 150–400)
RBC: 4.01 MIL/uL (ref 3.87–5.11)
RDW: 13.3 % (ref 11.5–15.5)
WBC Count: 8.7 10*3/uL (ref 4.0–10.5)
nRBC: 0 % (ref 0.0–0.2)

## 2022-09-02 LAB — CMP (CANCER CENTER ONLY)
ALT: 12 U/L (ref 0–44)
AST: 15 U/L (ref 15–41)
Albumin: 4.4 g/dL (ref 3.5–5.0)
Alkaline Phosphatase: 49 U/L (ref 38–126)
Anion gap: 8 (ref 5–15)
BUN: 11 mg/dL (ref 8–23)
CO2: 26 mmol/L (ref 22–32)
Calcium: 9.8 mg/dL (ref 8.9–10.3)
Chloride: 105 mmol/L (ref 98–111)
Creatinine: 0.75 mg/dL (ref 0.44–1.00)
GFR, Estimated: >60 mL/min (ref >60)
Glucose, Bld: 114 mg/dL — ABNORMAL HIGH (ref 70–99)
Potassium: 4 mmol/L (ref 3.5–5.1)
Sodium: 139 mmol/L (ref 135–145)
Total Bilirubin: 0.6 mg/dL (ref 0.3–1.2)
Total Protein: 6.9 g/dL (ref 6.5–8.1)

## 2022-09-02 NOTE — Progress Notes (Signed)
Referral MD  Reason for Referral: Recurrent thromboembolic disease of the left leg/pulmonary embolism  Chief Complaint  Patient presents with   New Patient (Initial Visit)  : I had another blood clot.  This thrombus was in my left leg and in my lungs.  HPI: Kathryn Pineda is a very nice 67 year old white female.  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  She really enjoyed this.  She had been seen here previously.  She had been seen by Dr. Dion Body in March 2021.  At that time, she had a right lower extremity DVT.  He was felt this was from estrogen therapy.  He was placed on Xarelto.  She did not really not have any type of hypercoagulable studies done.  Again the issues on Xarelto for about 3 months.  She recently retired from [employer].  She had been there for many years.  She went down to the beach.  She drove to the beach.  She noticed some pain in the left leg while she was down there.  She came back.  This was 2 days before her birthday.  The pain seemed to worsen.  She then had pain and swelling in the left leg.  Her leg was turning color.  She subsequently went to the emergency room.  She had a Doppler done of her leg.  This showed a large thrombus extending up to the iliac vein.  This went down to the femoral vein.  She had a CT of the chest abdomen pelvis done.  This did show acute pulmonary emboli.  She had a right heart strain.  She subsequently underwent thrombectomy.  She was admitted to [facility].  She was placed on heparin.  She had the thrombectomy.  This was successful.  She has a compression stocking on her left leg.  She subsequently was placed onto Eliquis.  She now comes back to see [place].  She is doing better.  She is having no problems with the Eliquis.  She does not smoke.  She does not drink.  She has had multiple births without any problems.  She has had multiple surgeries without any difficulty.  As far she knows, there is no history of blood clots  in the family.  Clearly, she is going need lifelong therapeutic anticoagulation.  At the present time, her performance status is ECOG 0.    Past Medical History:  Diagnosis Date   Anal fissure    Arthritis    Biliary colic 5/96   ERCP w/spincterotomy   Condyloma    Deep vein thrombosis (DVT) of lower extremity (HCC)    Fatigue    loss of sleep   Generalized headaches    GERD (gastroesophageal reflux disease)    Hemorrhoid    Hiatal hernia    Migraines    Obesity    Phlebitis of leg    PONV (postoperative nausea and vomiting)    Primary osteoarthritis of both knees    Stress incontinence    Wilson's disease   :   Past Surgical History:  Procedure Laterality Date   ABDOMINAL HYSTERECTOMY     ANAL FISSURECTOMY  1986   ANTERIOR AND POSTERIOR REPAIR N/A 11/24/2016   Procedure: ANTERIOR (CYSTOCELE) AND POSTERIOR REPAIR (RECTOCELE);  Surgeon: Alfredo Martinez, MD;  Location: [facility];  Service: Urology;  Laterality: N/A;   BUNIONECTOMY Bilateral 2005   CESAREAN SECTION  1990   prolapsed cord   CYSTOSCOPY N/A 11/24/2016   Procedure: CYSTOSCOPY;  Surgeon: Alfredo Martinez, MD;  Location: [facility];  Service: Urology;  Laterality: N/A;   ENDOVENOUS ABLATION SAPHENOUS VEIN W/ LASER  2008   both lower legs   LAPAROSCOPIC CHOLECYSTECTOMY  1997   LAPAROSCOPIC GASTRIC SLEEVE RESECTION WITH HIATAL HERNIA REPAIR  09/16/2010   LAPAROSCOPIC VAGINAL HYSTERECTOMY WITH SALPINGECTOMY Bilateral 11/24/2016   Procedure: LAPAROSCOPIC ASSISTED VAGINAL HYSTERECTOMY WITH SALPINGO-OOPHORECTOMY;  Surgeon: [name], MD;  Location: [facility];  Service: Gynecology;  Laterality: Bilateral;   LOWER EXTREMITY VENOGRAPHY  08/19/2022   Procedure: LOWER EXTREMITY VENOGRAPHY;  Surgeon: Victorino Sparrow, MD;  Location: [facility] INVASIVE CV LAB;  Service: Cardiovascular;;   PERIPHERAL VASCULAR THROMBECTOMY N/A 08/19/2022   Procedure: PERIPHERAL VASCULAR THROMBECTOMY;  Surgeon: Victorino Sparrow, MD;  Location: [facility]  INVASIVE CV LAB;  Service: Cardiovascular;  Laterality: N/A;   PERIPHERAL VASCULAR ULTRASOUND/IVUS  08/19/2022   Procedure: Peripheral Vascular Ultrasound/IVUS;  Surgeon: Victorino Sparrow, MD;  Location: [facility] INVASIVE CV LAB;  Service: Cardiovascular;;  :   Current Outpatient Medications:    acetaminophen (TYLENOL) 500 MG tablet, Take 500 mg by mouth every 6 (six) hours as needed for mild pain or moderate pain., Disp: , Rfl:    APIXABAN (ELIQUIS) VTE STARTER PACK (10MG  AND 5MG ), Take as directed on package: start with two-5mg  tablets twice daily for 7 days. On day 8, switch to one-5mg  tablet twice daily., Disp: 74 each, Rfl: 0   Cholecalciferol (VITAMIN D3) 125 MCG (5000 UT) CAPS, Take 5,000 Units by mouth daily., Disp: , Rfl:    Cyanocobalamin (B-12 PO), Take 1 tablet by mouth daily., Disp: , Rfl:    EPINEPHrine (EPIPEN 2-PAK) 0.3 mg/0.3 mL IJ SOAJ injection, Inject 0.3 mg into the muscle as needed for anaphylaxis., Disp: 2 each, Rfl: PRN   loratadine (CLARITIN) 5 MG chewable tablet, Chew 5 mg by mouth daily., Disp: , Rfl:    Multiple Vitamin (MULTIVITAMIN) tablet, Take 1 tablet by mouth daily., Disp: , Rfl:    Multiple Vitamins-Minerals (EMERGEN-C IMMUNE) PACK, Take 0.5 packets by mouth daily., Disp: , Rfl: :  :   Allergies  Allergen Reactions   Penicillins Itching and Rash   Adhesive [Tape] Other (See Comments)    Band-Aid causes blisters   Paxlovid [Nirmatrelvir-Ritonavir] Other (See Comments)    Severe allergic reaction, throat closed up and caused blisters all around the mouth.   :   Family History  Problem Relation Age of Onset   Diabetes Father    Colon polyps Father    Osteoarthritis Mother    Hypertension Brother    Cancer Maternal Uncle        leukemia   Cancer Maternal Grandmother        unaware  :   Social History   Socioeconomic History   Marital status: Married    Spouse name: Not on file   Number of children: Not on file   Years of education: Not on file    Highest education level: Not on file  Occupational History   Not on file  Tobacco Use   Smoking status: Never   Smokeless tobacco: Never  Vaping Use   Vaping status: Never Used  Substance and Sexual Activity   Alcohol use: Yes    Alcohol/week: 1.0 standard drink of alcohol    Types: 1 Glasses of wine per week   Drug use: No   Sexual activity: Yes    Partners: Male    Birth control/protection: Other-see comments, Surgical    Comment: LAVH  11/24/16  Other Topics Concern   Not on file  Social History Narrative   Not on file   Social Determinants of Health   Financial Resource Strain: Not on file  Food Insecurity: Not on file  Transportation Needs: Not on file  Physical Activity: Not on file  Stress: Not on file  Social Connections: Not on file  Intimate Partner Violence: Not on file  :  Review of Systems  Constitutional: Negative.   HENT: Negative.    Eyes: Negative.   Respiratory: Negative.    Cardiovascular: Negative.   Gastrointestinal: Negative.   Genitourinary: Negative.   Musculoskeletal: Negative.   Skin: Negative.   Neurological: Negative.   Endo/Heme/Allergies: Negative.   Psychiatric/Behavioral: Negative.       Exam: @IPVITALS @ Physical Exam Vitals reviewed.  HENT:     Head: Normocephalic and atraumatic.  Eyes:     Pupils: Pupils are equal, round, and reactive to light.  Cardiovascular:     Rate and Rhythm: Normal rate and regular rhythm.     Heart sounds: Normal heart sounds.     Comments: Cardiac exam is regular rate and rhythm with a normal S1 and S2.  There are no murmurs, rubs or bruits. Pulmonary:     Effort: Pulmonary effort is normal.     Breath sounds: Normal breath sounds.  Abdominal:     General: Bowel sounds are normal.     Palpations: Abdomen is soft.  Musculoskeletal:        General: No tenderness or deformity. Normal range of motion.     Cervical back: Normal range of motion.     Comments: Extremities shows a compression  stocking on the left leg.  I really cannot palpate any obvious venous cord.  She has good pulses in her distal extremities.  Lymphadenopathy:     Cervical: No cervical adenopathy.  Skin:    General: Skin is warm and dry.     Findings: No erythema or rash.  Neurological:     Mental Status: She is alert and oriented to person, place, and time.  Psychiatric:        Behavior: Behavior normal.        Thought Content: Thought content normal.        Judgment: Judgment normal.     Recent Labs    09/02/22 1356  WBC 8.7  HGB 12.8  HCT 38.5  PLT 274    Recent Labs    09/02/22 1356  NA 139  K 4.0  CL 105  CO2 26  GLUCOSE 114*  BUN 11  CREATININE 0.75  CALCIUM 9.8    Blood smear review: None  Pathology: None    Assessment and Plan: Kathryn Pineda is a very charming 67 year old white female.  She was a former Engineer, civil (consulting).  I think to her for service.  Patient now has a recurrence of a thromboembolic event.  This is in her left leg.  She had extensive thrombus along with pulmonary emboli.  Again, she clearly is going to need lifelong anticoagulation.  I suspect this is going to be therapeutic anticoagulation.  She really has never had a hypercoagulable panel done.  I think she may need to have this done.  We get this set up for when she comes back to see [place].  I would repeat a CT angiogram in about 3 months.  I know she has seen Vascular Surgery.  I think we will be doing ultrasounds of her leg.  I just hate that  this has happened to her.  She is so nice.  She certainly has had good response with a thrombectomy.  There really is very little swelling in the left leg.  I will like to see her back myself in about 6 weeks.

## 2022-09-03 ENCOUNTER — Other Ambulatory Visit: Payer: Self-pay | Admitting: *Deleted

## 2022-09-03 DIAGNOSIS — I82412 Acute embolism and thrombosis of left femoral vein: Secondary | ICD-10-CM

## 2022-09-03 DIAGNOSIS — Z86718 Personal history of other venous thrombosis and embolism: Secondary | ICD-10-CM

## 2022-09-03 LAB — FACTOR 5 LEIDEN

## 2022-09-09 ENCOUNTER — Other Ambulatory Visit: Payer: Self-pay | Admitting: Physician Assistant

## 2022-09-09 ENCOUNTER — Other Ambulatory Visit (HOSPITAL_COMMUNITY): Payer: Self-pay

## 2022-09-10 ENCOUNTER — Other Ambulatory Visit (HOSPITAL_COMMUNITY): Payer: Self-pay

## 2022-09-11 ENCOUNTER — Telehealth: Payer: Self-pay | Admitting: Family Medicine

## 2022-09-11 ENCOUNTER — Other Ambulatory Visit (HOSPITAL_BASED_OUTPATIENT_CLINIC_OR_DEPARTMENT_OTHER): Payer: Self-pay

## 2022-09-11 ENCOUNTER — Other Ambulatory Visit: Payer: Self-pay | Admitting: Hematology & Oncology

## 2022-09-11 ENCOUNTER — Other Ambulatory Visit: Payer: Self-pay

## 2022-09-11 DIAGNOSIS — I82412 Acute embolism and thrombosis of left femoral vein: Secondary | ICD-10-CM

## 2022-09-11 MED ORDER — APIXABAN 5 MG PO TABS
5.0000 mg | ORAL_TABLET | Freq: Two times a day (BID) | ORAL | 3 refills | Status: DC
Start: 2022-09-11 — End: 2023-09-23
  Filled 2022-09-11: qty 180, 90d supply, fill #0
  Filled 2022-12-09: qty 180, 90d supply, fill #1
  Filled 2023-02-03 – 2023-02-16 (×2): qty 180, 90d supply, fill #2
  Filled 2023-05-09 – 2023-05-17 (×2): qty 180, 90d supply, fill #3

## 2022-09-11 NOTE — Telephone Encounter (Signed)
Pt came in asking for a refill of Eliquis. Pt has already taken starter pack but needs the first refill of 5 mg twice a day. Pt asks that it be sent to the pharmacy downstairs. Pt requests to be called when rx is sent in.

## 2022-09-11 NOTE — Telephone Encounter (Signed)
Is this managed by Oncology?

## 2022-09-14 ENCOUNTER — Other Ambulatory Visit (HOSPITAL_BASED_OUTPATIENT_CLINIC_OR_DEPARTMENT_OTHER): Payer: Self-pay

## 2022-09-17 ENCOUNTER — Other Ambulatory Visit (HOSPITAL_COMMUNITY): Payer: Self-pay

## 2022-09-24 NOTE — Progress Notes (Signed)
Office Note    HPI: Kathryn Pineda is a 67 y.o. (May 06, 1955) female presenting in follow-up status post left lower extremity DVT percutaneous mechanical thrombectomy, no stenting, no May Thurner 08/19/2022.  On exam today, Kathryn Pineda was doing well.  She continues to wear compression stockings on a daily basis.  She feels like bilateral lower extremity swelling has improved dramatically.  Kathryn Pineda has been compliant with her anticoagulation, and has plans to be on lifelong per hematology/oncology.  Recently retired, she is working on increasing her activity level with the goal of working out on a daily basis.  Denies symptoms of claudication, ischemic rest pain, tissue loss. No left lower extremity heaviness, asymmetric edema.   Past Medical History:  Diagnosis Date   Anal fissure    Arthritis    Biliary colic 5/96   ERCP w/spincterotomy   Condyloma    Deep vein thrombosis (DVT) of lower extremity (HCC)    Fatigue    loss of sleep   Generalized headaches    GERD (gastroesophageal reflux disease)    Hemorrhoid    Hiatal hernia    Migraines    Obesity    Phlebitis of leg    PONV (postoperative nausea and vomiting)    Primary osteoarthritis of both knees    Stress incontinence    Wilson's disease     Past Surgical History:  Procedure Laterality Date   ABDOMINAL HYSTERECTOMY     ANAL FISSURECTOMY  1986   ANTERIOR AND POSTERIOR REPAIR N/A 11/24/2016   Procedure: ANTERIOR (CYSTOCELE) AND POSTERIOR REPAIR (RECTOCELE);  Surgeon: Alfredo Martinez, MD;  Location: [facility];  Service: Urology;  Laterality: N/A;   BUNIONECTOMY Bilateral 2005   CESAREAN SECTION  1990   prolapsed cord   CYSTOSCOPY N/A 11/24/2016   Procedure: CYSTOSCOPY;  Surgeon: Alfredo Martinez, MD;  Location: [facility];  Service: Urology;  Laterality: N/A;   ENDOVENOUS ABLATION SAPHENOUS VEIN W/ LASER  2008   both lower legs   LAPAROSCOPIC CHOLECYSTECTOMY  1997   LAPAROSCOPIC GASTRIC SLEEVE RESECTION WITH HIATAL HERNIA REPAIR   09/16/2010   LAPAROSCOPIC VAGINAL HYSTERECTOMY WITH SALPINGECTOMY Bilateral 11/24/2016   Procedure: LAPAROSCOPIC ASSISTED VAGINAL HYSTERECTOMY WITH SALPINGO-OOPHORECTOMY;  Surgeon: [name], MD;  Location: [facility];  Service: Gynecology;  Laterality: Bilateral;   LOWER EXTREMITY VENOGRAPHY  08/19/2022   Procedure: LOWER EXTREMITY VENOGRAPHY;  Surgeon: Victorino Sparrow, MD;  Location: [facility] INVASIVE CV LAB;  Service: Cardiovascular;;   PERIPHERAL VASCULAR THROMBECTOMY N/A 08/19/2022   Procedure: PERIPHERAL VASCULAR THROMBECTOMY;  Surgeon: Victorino Sparrow, MD;  Location: [facility] INVASIVE CV LAB;  Service: Cardiovascular;  Laterality: N/A;   PERIPHERAL VASCULAR ULTRASOUND/IVUS  08/19/2022   Procedure: Peripheral Vascular Ultrasound/IVUS;  Surgeon: Victorino Sparrow, MD;  Location: [facility] INVASIVE CV LAB;  Service: Cardiovascular;;    Social History   Socioeconomic History   Marital status: Married    Spouse name: Not on file   Number of children: Not on file   Years of education: Not on file   Highest education level: Not on file  Occupational History   Not on file  Tobacco Use   Smoking status: Never   Smokeless tobacco: Never  Vaping Use   Vaping status: Never Used  Substance and Sexual Activity   Alcohol use: Yes    Alcohol/week: 1.0 standard drink of alcohol    Types: 1 Glasses of wine per week   Drug use: No   Sexual activity: Yes    Partners: Male  Birth control/protection: Other-see comments, Surgical    Comment: LAVH 11/24/16  Other Topics Concern   Not on file  Social History Narrative   Not on file   Social Determinants of Health   Financial Resource Strain: Not on file  Food Insecurity: Not on file  Transportation Needs: Not on file  Physical Activity: Not on file  Stress: Not on file  Social Connections: Not on file  Intimate Partner Violence: Not on file   Family History  Problem Relation Age of Onset   Diabetes Father    Colon polyps Father    Osteoarthritis  Mother    Hypertension Brother    Cancer Maternal Uncle        leukemia   Cancer Maternal Grandmother        unaware    Current Outpatient Medications  Medication Sig Dispense Refill   acetaminophen (TYLENOL) 500 MG tablet Take 500 mg by mouth every 6 (six) hours as needed for mild pain or moderate pain.     apixaban (ELIQUIS) 5 MG TABS tablet Take 1 tablet (5 mg total) by mouth 2 (two) times daily. 180 tablet 3   Cholecalciferol (VITAMIN D3) 125 MCG (5000 UT) CAPS Take 5,000 Units by mouth daily.     Cyanocobalamin (B-12 PO) Take 1 tablet by mouth daily.     EPINEPHrine (EPIPEN 2-PAK) 0.3 mg/0.3 mL IJ SOAJ injection Inject 0.3 mg into the muscle as needed for anaphylaxis. 2 each PRN   loratadine (CLARITIN) 5 MG chewable tablet Chew 5 mg by mouth daily.     Multiple Vitamin (MULTIVITAMIN) tablet Take 1 tablet by mouth daily.     Multiple Vitamins-Minerals (EMERGEN-C IMMUNE) PACK Take 0.5 packets by mouth daily.     No current facility-administered medications for this visit.    Allergies  Allergen Reactions   Penicillins Itching and Rash   Adhesive [Tape] Other (See Comments)    Band-Aid causes blisters   Paxlovid [Nirmatrelvir-Ritonavir] Other (See Comments)    Severe allergic reaction, throat closed up and caused blisters all around the mouth.      REVIEW OF SYSTEMS:  [X]  denotes positive finding, [ ]  denotes negative finding Cardiac  Comments:  Chest pain or chest pressure:    Shortness of breath upon exertion:    Short of breath when lying flat:    Irregular heart rhythm:        Vascular    Pain in calf, thigh, or hip brought on by ambulation:    Pain in feet at night that wakes you up from your sleep:     Blood clot in your veins:    Leg swelling:         Pulmonary    Oxygen at home:    Productive cough:     Wheezing:         Neurologic    Sudden weakness in arms or legs:     Sudden numbness in arms or legs:     Sudden onset of difficulty speaking or slurred  speech:    Temporary loss of vision in one eye:     Problems with dizziness:         Gastrointestinal    Blood in stool:     Vomited blood:         Genitourinary    Burning when urinating:     Blood in urine:        Psychiatric    Major depression:  Hematologic    Bleeding problems:    Problems with blood clotting too easily:        Skin    Rashes or ulcers:        Constitutional    Fever or chills:      PHYSICAL EXAMINATION:  There were no vitals filed for this visit.  General:  WDWN in NAD; vital signs documented above Gait: Not observed HENT: WNL, normocephalic Pulmonary: normal non-labored breathing , without wheezing Cardiac: regular HR Abdomen: soft, NT, no masses Skin: without rashes Vascular Exam/Pulses:  Right Left  Radial 2+ (normal) 2+ (normal)  Ulnar    Femoral    Popliteal    DP 2+ (normal) 2+ (normal)  PT     Extremities: without ischemic changes, without Gangrene , without cellulitis; without open wounds;  Musculoskeletal: no muscle wasting or atrophy  Neurologic: A&O X 3;  No focal weakness or paresthesias are detected Psychiatric:  The pt has Normal affect.   Non-Invasive Vascular Imaging:   Patent left venous system with questionable chronic thrombus that is partially occlusive at the SFJ. Ilio caval system widely patent    ASSESSMENT/PLAN: SICILIA TWIGGS is a 67 y.o. female presenting status post percutaneous thrombectomy for iliocaval, left lower extremity DVT.  No May Thurner.  Imaging was reviewed, the left iliocaval system is widely patent.   I am happy with the result.   Patient should continue anticoagulation for the foreseeable future -appreciate hematology oncology involvement. She can follow-up with my office as needed. We discussed the risk of post thrombotic syndrome, and I asked her to continue wearing compression stockings for the foreseeable future as well. She was asked to call my office should any questions or  concerns arise.   Victorino Sparrow, MD [facility] [phone]

## 2022-09-25 ENCOUNTER — Ambulatory Visit (HOSPITAL_COMMUNITY)
Admission: RE | Admit: 2022-09-25 | Discharge: 2022-09-25 | Disposition: A | Discharge status: Home / Self Care | Payer: Medicare Other | Source: Ambulatory Visit | Attending: Vascular Surgery | Admitting: Vascular Surgery

## 2022-09-25 ENCOUNTER — Ambulatory Visit (INDEPENDENT_AMBULATORY_CARE_PROVIDER_SITE_OTHER)
Admission: RE | Admit: 2022-09-25 | Discharge: 2022-09-25 | Disposition: A | Discharge status: Home / Self Care | Payer: Medicare Other | Source: Ambulatory Visit | Attending: Vascular Surgery | Admitting: Vascular Surgery

## 2022-09-25 ENCOUNTER — Encounter: Payer: Self-pay | Admitting: Vascular Surgery

## 2022-09-25 ENCOUNTER — Ambulatory Visit (INDEPENDENT_AMBULATORY_CARE_PROVIDER_SITE_OTHER): Payer: 59 | Admitting: Vascular Surgery

## 2022-09-25 VITALS — BP 111/64 | HR 66 | Temp 98.0°F | Resp 20 | Ht 68.5 in | Wt 209.0 lb

## 2022-09-25 DIAGNOSIS — Z86718 Personal history of other venous thrombosis and embolism: Secondary | ICD-10-CM | POA: Insufficient documentation

## 2022-09-25 DIAGNOSIS — I82412 Acute embolism and thrombosis of left femoral vein: Secondary | ICD-10-CM

## 2022-09-25 DIAGNOSIS — Z7901 Long term (current) use of anticoagulants: Secondary | ICD-10-CM

## 2022-09-29 ENCOUNTER — Telehealth: Payer: Self-pay

## 2022-09-29 NOTE — Telephone Encounter (Signed)
Pt called stating that she had an episode of vertigo on Saturday morning. It was the first time she's ever had it. It is improving, but she was concerned that it had something to do with the [place] she had on Friday.  Reviewed pt's chart, returned call for clarification, two identifiers used. Assured her that it would not have anything to do with the [place]. Instructed her to reach out to her PCP who may refer her to ENT, if it worsened or she had future episodes. Confirmed understanding.

## 2022-10-08 ENCOUNTER — Other Ambulatory Visit (HOSPITAL_BASED_OUTPATIENT_CLINIC_OR_DEPARTMENT_OTHER): Payer: Self-pay

## 2022-10-08 ENCOUNTER — Ambulatory Visit (INDEPENDENT_AMBULATORY_CARE_PROVIDER_SITE_OTHER): Payer: Medicare Other | Admitting: Family Medicine

## 2022-10-08 VITALS — BP 108/68 | HR 88 | Temp 97.6°F | Resp 18 | Ht 68.5 in | Wt 213.2 lb

## 2022-10-08 DIAGNOSIS — R300 Dysuria: Secondary | ICD-10-CM

## 2022-10-08 LAB — POCT URINALYSIS DIPSTICK
Bilirubin, UA: NEGATIVE
Glucose, UA: NEGATIVE
Ketones, UA: NEGATIVE
Nitrite, UA: NEGATIVE
Protein, UA: POSITIVE — AB
Spec Grav, UA: 1.01 (ref 1.010–1.025)
Urobilinogen, UA: 0.2 E.U./dL
pH, UA: 6 (ref 5.0–8.0)

## 2022-10-08 MED ORDER — CIPROFLOXACIN HCL 250 MG PO TABS
250.0000 mg | ORAL_TABLET | Freq: Two times a day (BID) | ORAL | 0 refills | Status: AC
Start: 2022-10-08 — End: 2022-10-13
  Filled 2022-10-08: qty 10, 5d supply, fill #0

## 2022-10-08 NOTE — Progress Notes (Addendum)
[facility] at [facility] [address] [place], [place] [address] [phone]  Date:  10/08/2022   Name:  Kathryn Pineda   DOB:  June 05, 1955   MRN:  [id number]  PCP:  Pearline Cables, MD    Chief Complaint: possible uti (Pain with urination x 2 days (when she had diarrhea). Yesterday she started running a fever. /Concerns/ questions: 1. follow up on dizziness form ER.  Discuss labs for clotting disorder. )   History of Present Illness:  Kathryn Pineda is a 67 y.o. very pleasant female patient who presents with the following:  Pt seen today with urinary symptoms  2 days ago she had dysuria and had diarrhea for about a day She was feeling much better yesterday but then she had a subjective fever-cannot find her thermometer Some chills, no body aches Some right flank pain is present, will come and go  She did test for covid this am and she was negative  No abd pain or vomiting  She feels overall better today, the want to make sure she is not have a urinary tract infection  We discussed recent episode of dizziness that occurred while she was having a follow-up ultrasound for lower extremity blood clot.  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   Acute deep vein thrombosis (DVT) of femoral vein of left lower extremity (HCC) 08/16/2022   Internal hemorrhoids 07/24/2022   Thrombophlebitis of superficial veins of lower extremity 07/24/2022   History of DVT (deep vein thrombosis) 07/24/2022   Viral URI 04/10/2022   Personal history of colonic polyps 07/15/2021   Rectocele 11/13/2016   Incomplete uterine prolapse 11/13/2016   Cystocele, midline 11/13/2016   Seasonal allergies 09/17/2014   Incontinence    Obesity (BMI 30-39.9) 12/24/2010    Hx of laparoscopic gastric banding 09/23/2010    Past Medical History:  Diagnosis Date   Anal fissure    Arthritis    Biliary colic 5/96   ERCP w/spincterotomy   Condyloma    Deep vein thrombosis (DVT) of lower extremity (HCC)    Fatigue    loss of sleep   Generalized headaches    GERD (gastroesophageal reflux disease)    Hemorrhoid    Hiatal hernia    Migraines    Obesity    Phlebitis of leg    PONV (postoperative nausea and vomiting)    Primary osteoarthritis of both knees    Stress incontinence    Wilson's disease     Past Surgical History:  Procedure Laterality Date   ABDOMINAL HYSTERECTOMY     ANAL FISSURECTOMY  1986   ANTERIOR AND POSTERIOR REPAIR N/A 11/24/2016   Procedure: ANTERIOR (CYSTOCELE) AND POSTERIOR REPAIR (RECTOCELE);  Surgeon: Alfredo Martinez, MD;  Location: [facility];  Service: Urology;  Laterality: N/A;   BUNIONECTOMY Bilateral 2005   CESAREAN SECTION  1990   prolapsed cord   CYSTOSCOPY N/A 11/24/2016   Procedure: CYSTOSCOPY;  Surgeon: Alfredo Martinez, MD;  Location: [facility];  Service: Urology;  Laterality: N/A;   ENDOVENOUS ABLATION SAPHENOUS VEIN W/ LASER  2008   both lower legs   LAPAROSCOPIC CHOLECYSTECTOMY  1997   LAPAROSCOPIC GASTRIC SLEEVE RESECTION WITH HIATAL HERNIA REPAIR  09/16/2010   LAPAROSCOPIC VAGINAL HYSTERECTOMY  WITH SALPINGECTOMY Bilateral 11/24/2016   Procedure: LAPAROSCOPIC ASSISTED VAGINAL HYSTERECTOMY WITH SALPINGO-OOPHORECTOMY;  Surgeon: [name], MD;  Location: [facility];  Service: Gynecology;  Laterality: Bilateral;   LOWER EXTREMITY VENOGRAPHY  08/19/2022   Procedure: LOWER EXTREMITY VENOGRAPHY;  Surgeon: Victorino Sparrow, MD;  Location: [facility] INVASIVE CV LAB;  Service: Cardiovascular;;   PERIPHERAL VASCULAR THROMBECTOMY N/A 08/19/2022   Procedure: PERIPHERAL VASCULAR THROMBECTOMY;  Surgeon: Victorino Sparrow, MD;  Location: [facility] INVASIVE CV LAB;  Service: Cardiovascular;  Laterality: N/A;   PERIPHERAL VASCULAR ULTRASOUND/IVUS   08/19/2022   Procedure: Peripheral Vascular Ultrasound/IVUS;  Surgeon: Victorino Sparrow, MD;  Location: [facility] INVASIVE CV LAB;  Service: Cardiovascular;;    Social History   Tobacco Use   Smoking status: Never   Smokeless tobacco: Never  Vaping Use   Vaping status: Never Used  Substance Use Topics   Alcohol use: Yes    Alcohol/week: 1.0 standard drink of alcohol    Types: 1 Glasses of wine per week   Drug use: No    Family History  Problem Relation Age of Onset   Diabetes Father    Colon polyps Father    Osteoarthritis Mother    Hypertension Brother    Cancer Maternal Uncle        leukemia   Cancer Maternal Grandmother        unaware    Allergies  Allergen Reactions   Penicillins Itching and Rash   Adhesive [Tape] Other (See Comments)    Band-Aid causes blisters   Paxlovid [Nirmatrelvir-Ritonavir] Other (See Comments)    Severe allergic reaction, throat closed up and caused blisters all around the mouth.     Medication list has been reviewed and updated.  Current Outpatient Medications on File Prior to Visit  Medication Sig Dispense Refill   acetaminophen (TYLENOL) 500 MG tablet Take 500 mg by mouth every 6 (six) hours as needed for mild pain or moderate pain.     apixaban (ELIQUIS) 5 MG TABS tablet Take 1 tablet (5 mg total) by mouth 2 (two) times daily. 180 tablet 3   Cholecalciferol (VITAMIN D3) 125 MCG (5000 UT) CAPS Take 5,000 Units by mouth daily.     Cyanocobalamin (B-12 PO) Take 1 tablet by mouth daily.     EPINEPHrine (EPIPEN 2-PAK) 0.3 mg/0.3 mL IJ SOAJ injection Inject 0.3 mg into the muscle as needed for anaphylaxis. 2 each PRN   loratadine (CLARITIN) 5 MG chewable tablet Chew 5 mg by mouth daily.     Multiple Vitamin (MULTIVITAMIN) tablet Take 1 tablet by mouth daily.     Multiple Vitamins-Minerals (EMERGEN-C IMMUNE) PACK Take 0.5 packets by mouth daily.     No current facility-administered medications on file prior to visit.    Review of Systems:  As  per HPI- otherwise negative.   Physical Examination: Vitals:   10/08/22 1542  BP: 108/68  Pulse: 88  Resp: 18  Temp: 97.6 F (36.4 C)  SpO2: 99%   Vitals:   10/08/22 1542  Weight: 213 lb 3.2 oz (96.7 kg)  Height: 5' 8.5" (1.74 m)   Body mass index is 31.95 kg/m. Ideal Body Weight: Weight in (lb) to have BMI = 25: 166.5  GEN: no acute distress.  Mildly obese, looks well HEENT: Atraumatic, Normocephalic.  Ears and Nose: No external deformity. CV: RRR, No M/G/R. No JVD. No thrill. No extra heart sounds. PULM: CTA B, no wheezes, crackles, rhonchi. No retractions. No resp. distress. No accessory muscle use. ABD:  S, NT, ND, +BS. No rebound. No HSM.  Belly is benign, no CVA tenderness EXTR: No c/c/e PSYCH: Normally interactive. Conversant.      Assessment and Plan: Dysuria - Plan: POCT Urinalysis Dipstick, ciprofloxacin (CIPRO) 250 MG tablet, Urine Culture, Urine Microscopic Only  Patient seen today with concern of dysuria, UA is suggestive of UTI.  Will start her on Cipro given her subjective fever and chills.  She is also penicillin allergic.  Will be back in touch with her pending culture.  I have asked her to let me know if she is not feeling improved within about 24 hours  Signed Abbe Amsterdam, MD  Addendum 8/23, await urine culture Results for orders placed or performed in visit on 10/08/22  Urine Microscopic Only  Result Value Ref Range   WBC, UA 21-50/hpf (A) 0-2/hpf   RBC / HPF 7-10/hpf (A) 0-2/hpf   Bacteria, UA Many(>50/hpf) (A) None  POCT Urinalysis Dipstick  Result Value Ref Range   Color, UA yellow    Clarity, UA cloudy    Glucose, UA Negative Negative   Bilirubin, UA negative    Ketones, UA negative    Spec Grav, UA 1.010 1.010 - 1.025   Blood, UA large    pH, UA 6.0 5.0 - 8.0   Protein, UA Positive (A) Negative   Urobilinogen, UA 0.2 0.2 or 1.0 E.U./dL   Nitrite, UA negative    Leukocytes, UA Large (3+) (A) Negative   Appearance cloudy     Odor none

## 2022-10-08 NOTE — Patient Instructions (Addendum)
It was good to see you again  I will be touch with your culture asap - start on cipro for now but please let me know if you are not feeling better in the next couple of days- Sooner if worse.

## 2022-10-09 LAB — URINALYSIS, MICROSCOPIC ONLY

## 2022-10-10 ENCOUNTER — Encounter: Payer: Self-pay | Admitting: Family Medicine

## 2022-10-10 LAB — URINE CULTURE
MICRO NUMBER:: 15368165
SPECIMEN QUALITY:: ADEQUATE

## 2022-10-22 ENCOUNTER — Encounter: Payer: Self-pay | Admitting: Family Medicine

## 2022-10-23 ENCOUNTER — Encounter: Payer: Self-pay | Admitting: Physician Assistant

## 2022-10-23 ENCOUNTER — Ambulatory Visit (HOSPITAL_BASED_OUTPATIENT_CLINIC_OR_DEPARTMENT_OTHER)
Admission: RE | Admit: 2022-10-23 | Discharge: 2022-10-23 | Disposition: A | Discharge status: Home / Self Care | Payer: Medicare Other | Source: Ambulatory Visit | Attending: Physician Assistant

## 2022-10-23 ENCOUNTER — Ambulatory Visit (INDEPENDENT_AMBULATORY_CARE_PROVIDER_SITE_OTHER): Payer: Medicare Other | Admitting: Physician Assistant

## 2022-10-23 VITALS — BP 120/68 | HR 83 | Temp 98.2°F | Resp 20 | Wt 212.8 lb

## 2022-10-23 DIAGNOSIS — M79645 Pain in left finger(s): Secondary | ICD-10-CM | POA: Diagnosis not present

## 2022-10-23 DIAGNOSIS — S6992XA Unspecified injury of left wrist, hand and finger(s), initial encounter: Secondary | ICD-10-CM | POA: Diagnosis not present

## 2022-10-23 NOTE — Telephone Encounter (Signed)
Pt being seen on 10/23/22 by LD.

## 2022-10-23 NOTE — Progress Notes (Signed)
Established patient visit   Patient: Kathryn Pineda   DOB: [birth year]   67 y.o. Female  MRN: [id number] Visit Date: 10/23/2022  Today's healthcare provider: Alfredia Ferguson, PA-C   Cc. Left finger paiin, swelling  Subjective    HPI  Pt reports her left ring finger was caught on a leash while walking her dog yesterday. Initially not painful put now cannot bend, fluid buildup and swelling. She is on Eliquis.  Medications: Outpatient Medications Prior to Visit  Medication Sig   acetaminophen (TYLENOL) 500 MG tablet Take 500 mg by mouth every 6 (six) hours as needed for mild pain or moderate pain.   apixaban (ELIQUIS) 5 MG TABS tablet Take 1 tablet (5 mg total) by mouth 2 (two) times daily.   Cholecalciferol (VITAMIN D3) 125 MCG (5000 UT) CAPS Take 5,000 Units by mouth daily.   Cyanocobalamin (B-12 PO) Take 1 tablet by mouth daily.   EPINEPHrine (EPIPEN 2-PAK) 0.3 mg/0.3 mL IJ SOAJ injection Inject 0.3 mg into the muscle as needed for anaphylaxis.   loratadine (CLARITIN) 5 MG chewable tablet Chew 5 mg by mouth daily.   Multiple Vitamin (MULTIVITAMIN) tablet Take 1 tablet by mouth daily.   Multiple Vitamins-Minerals (EMERGEN-C IMMUNE) PACK Take 0.5 packets by mouth daily.   No facility-administered medications prior to visit.    Review of Systems  Constitutional:  Negative for fatigue and fever.  Respiratory:  Negative for cough and shortness of breath.   Cardiovascular:  Negative for chest pain and leg swelling.  Gastrointestinal:  Negative for abdominal pain.  Musculoskeletal:  Positive for arthralgias.  Neurological:  Negative for dizziness and headaches.      Objective    BP 120/68 (BP Location: Right Arm, Patient Position: Sitting, Cuff Size: Normal)   Pulse 83   Temp 98.2 F (36.8 C)   Resp 20   Wt 212 lb 12.8 oz (96.5 kg)   SpO2 98%   BMI 31.89 kg/m   Physical Exam Vitals reviewed.  Constitutional:      Appearance: She is not ill-appearing.  HENT:      Head: Normocephalic.  Eyes:     Conjunctiva/sclera: Conjunctivae normal.  Cardiovascular:     Rate and Rhythm: Normal rate.  Pulmonary:     Effort: Pulmonary effort is normal. No respiratory distress.  Musculoskeletal:     Comments: Left ring finger with PIP swelling, tednnerness, lack of ROM. No ecchymosis.  Neurological:     General: No focal deficit present.     Mental Status: She is alert and oriented to person, place, and time.  Psychiatric:        Mood and Affect: Mood normal.        Behavior: Behavior normal.      No results found for any visits on 10/23/22.  Assessment & Plan     1. Finger pain, left Xray r/o fx , likely a hematoma Recommending finger split or buddy taping. Did not tape today as pt is going downstairs to get xray.  Elevate, ice.   - DG Hand Complete Left; Future   Return if symptoms worsen or fail to improve.      I, Alfredia Ferguson, PA-C have reviewed all documentation for this visit. The documentation on  10/23/22   for the exam, diagnosis, procedures, and orders are all accurate and complete.    Alfredia Ferguson, PA-C  [facility] [phone] (phone) [phone] (fax)  [facility]

## 2022-11-20 ENCOUNTER — Encounter: Payer: Self-pay | Admitting: Family

## 2022-11-20 ENCOUNTER — Ambulatory Visit (INDEPENDENT_AMBULATORY_CARE_PROVIDER_SITE_OTHER): Payer: Medicare Other | Admitting: Family

## 2022-11-20 ENCOUNTER — Other Ambulatory Visit (HOSPITAL_BASED_OUTPATIENT_CLINIC_OR_DEPARTMENT_OTHER): Payer: Self-pay

## 2022-11-20 VITALS — BP 134/78 | HR 76 | Resp 18 | Ht 68.5 in | Wt 220.8 lb

## 2022-11-20 DIAGNOSIS — M79642 Pain in left hand: Secondary | ICD-10-CM

## 2022-11-20 MED ORDER — DOXYCYCLINE HYCLATE 100 MG PO TABS
100.0000 mg | ORAL_TABLET | Freq: Two times a day (BID) | ORAL | 0 refills | Status: DC
  Filled 2022-11-20: qty 14, 7d supply, fill #0

## 2022-11-20 NOTE — Progress Notes (Signed)
Kathryn Pineda is a 67 y.o. 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   Acute deep vein thrombosis (DVT) of femoral vein of left lower extremity (HCC) 08/16/2022   Internal hemorrhoids 07/24/2022   Thrombophlebitis of superficial veins of lower extremity 07/24/2022   History of DVT (deep vein thrombosis) 07/24/2022   Viral URI 04/10/2022   History of colonic polyps 07/15/2021   Rectocele 11/13/2016   Incomplete uterine prolapse 11/13/2016   Cystocele, midline 11/13/2016   Seasonal allergies 09/17/2014   Incontinence    Obesity (BMI 30-39.9) 12/24/2010   Hx of laparoscopic gastric banding 09/23/2010    Current Outpatient Medications  Medication Sig Dispense Refill   acetaminophen (TYLENOL) 500 MG tablet Take 500 mg by mouth every 6 (six) hours as needed for mild pain or moderate pain.     apixaban (ELIQUIS) 5 MG TABS tablet Take 1 tablet (5 mg total) by mouth 2 (two) times daily. 180 tablet 3   Cholecalciferol (VITAMIN D3) 125 MCG (5000 UT) CAPS Take 5,000 Units by mouth daily.     Cyanocobalamin (B-12 PO) Take 1 tablet by mouth daily.     doxycycline (VIBRA-TABS) 100 MG tablet Take 1 tablet (100 mg total) by mouth 2 (two) times daily. 14 tablet 0   EPINEPHrine (EPIPEN 2-PAK) 0.3 mg/0.3 mL IJ SOAJ injection Inject 0.3 mg into the muscle as needed for anaphylaxis. 2 each PRN   loratadine (CLARITIN) 5 MG chewable tablet Chew 5 mg by mouth daily.     Multiple Vitamin (MULTIVITAMIN) tablet Take 1 tablet by mouth daily.     Multiple Vitamins-Minerals (EMERGEN-C IMMUNE) PACK Take 0.5 packets by mouth daily.     No current facility-administered medications for this visit.    Allergies: Penicillins, Adhesive [tape], and Paxlovid [nirmatrelvir-ritonavir]  Past Medical History:  Diagnosis Date   Anal fissure     Arthritis    Biliary colic 5/96   ERCP w/spincterotomy   Condyloma    Deep vein thrombosis (DVT) of lower extremity (HCC)    Fatigue    loss of sleep   Generalized headaches    GERD (gastroesophageal reflux disease)    Hemorrhoid    Hiatal hernia    Migraines    Obesity    Phlebitis of leg    PONV (postoperative nausea and vomiting)    Primary osteoarthritis of both knees    Stress incontinence    Wilson's disease     Past Surgical History:  Procedure Laterality Date   ABDOMINAL HYSTERECTOMY     ANAL FISSURECTOMY  1986   ANTERIOR AND POSTERIOR REPAIR N/A 11/24/2016   Procedure: ANTERIOR (CYSTOCELE) AND POSTERIOR REPAIR (RECTOCELE);  Surgeon: Alfredo Martinez, MD;  Location: [facility];  Service: Urology;  Laterality: N/A;   BUNIONECTOMY Bilateral 2005   CESAREAN SECTION  1990   prolapsed cord   CYSTOSCOPY N/A 11/24/2016   Procedure: CYSTOSCOPY;  Surgeon: Alfredo Martinez, MD;  Location: [facility];  Service: Urology;  Laterality: N/A;   ENDOVENOUS ABLATION SAPHENOUS VEIN W/ LASER  2008   both lower legs   LAPAROSCOPIC CHOLECYSTECTOMY  1997   LAPAROSCOPIC GASTRIC SLEEVE RESECTION WITH HIATAL HERNIA REPAIR  09/16/2010   LAPAROSCOPIC VAGINAL HYSTERECTOMY WITH SALPINGECTOMY Bilateral 11/24/2016   Procedure: LAPAROSCOPIC ASSISTED VAGINAL HYSTERECTOMY WITH SALPINGO-OOPHORECTOMY;  Surgeon: [name], MD;  Location: [facility];  Service: Gynecology;  Laterality: Bilateral;   LOWER EXTREMITY VENOGRAPHY  08/19/2022   Procedure: LOWER EXTREMITY VENOGRAPHY;  Surgeon: Victorino Sparrow, MD;  Location: [facility] INVASIVE CV LAB;  Service: Cardiovascular;;   PERIPHERAL VASCULAR THROMBECTOMY N/A 08/19/2022   Procedure: PERIPHERAL VASCULAR THROMBECTOMY;  Surgeon: Victorino Sparrow, MD;  Location: [facility] INVASIVE CV LAB;  Service: Cardiovascular;  Laterality: N/A;   PERIPHERAL VASCULAR ULTRASOUND/IVUS  08/19/2022   Procedure: Peripheral Vascular Ultrasound/IVUS;  Surgeon: Victorino Sparrow, MD;  Location: [facility] INVASIVE  CV LAB;  Service: Cardiovascular;;    Family History  Problem Relation Age of Onset   Diabetes Father    Colon polyps Father    Osteoarthritis Mother    Hypertension Brother    Cancer Maternal Uncle        leukemia   Cancer Maternal Grandmother        unaware    Social History   Tobacco Use   Smoking status: Never   Smokeless tobacco: Never  Substance Use Topics   Alcohol use: Yes    Alcohol/week: 1.0 standard drink of alcohol    Types: 1 Glasses of wine per week    Subjective:   Seen early September with concerns for injury 4th finger secondary to having finger pulled with dog leash; noticed in past few days that has localized redness over medial side of finger; does feel that swelling has worsened again in the past few days;    Objective:  Vitals:   11/20/22 1538  BP: 134/78  Pulse: 76  Resp: 18  SpO2: 100%  Weight: 220 lb 12.8 oz (100.2 kg)  Height: 5' 8.5" (1.74 m)    General: Well developed, well nourished, in no acute distress  Skin : Warm and dry. Lcoalized redness/ swelling noted on lateral side 4th finger left hand at site of original injury Head: Normocephalic and atraumatic  Lungs: Respirations unlabored;  Neurologic: Alert and oriented; speech intact; face symmetrical; moves all extremities well; CNII-XII intact without focal deficit   Assessment:  1. Pain of left hand     Plan:  Injury occurred last month- worsening swelling in past few days; will treat with Doxycycline to cover for possible infection; have requested STAT referral to orthopedics for further evaluation,   No follow-ups on file.  Orders Placed This Encounter  Procedures   Ambulatory referral to Orthopedic Surgery    Referral Priority:   Routine    Referral Type:   Surgical    Referral Reason:   Specialty Services Required    Requested Specialty:   Orthopedic Surgery    Number of Visits Requested:   1    Requested Prescriptions   Signed Prescriptions Disp Refills   doxycycline  (VIBRA-TABS) 100 MG tablet 14 tablet 0    Sig: Take 1 tablet (100 mg total) by mouth 2 (two) times daily.

## 2022-11-22 NOTE — Progress Notes (Addendum)
[facility] [address] [place], [place] [address] [phone]  Date:  11/25/2022   Name:  Kathryn Pineda   DOB:  [birth year]   MRN:  [id number]  PCP:  Pearline Cables, MD    Chief Complaint: Welcome to Medicare (Concerns/ questions: infection in the fingers, taking Doxy. /)   History of Present Illness:  Kathryn Pineda is a 67 y.o. very pleasant female patient who presents with the following:  Patient seen today for a Welcome to Medicare visit Most recent visit with myself was this past August for concern of possible UTI  History of DVT treated with Xarelto for 3 months in 2020, cystocele, lap band surgery She has 3 adult children, her daughter who is her youngest is in medical school at [employer] Her 2 sons are older and out of school  They plan to do a CT to follow-up on her history of PE/DVT- she was seen by hematology yesterday, labs are pending   She injured her left ring finger about a month ago- she had an x-ray which was negative.  It is still tender and she has noted a red spot - she was seen by Vernona Rieger on 10/4 and started on doxy, she is seeing hand surgery coming up Her pain is much better now-she does think doxycycline has improved her symptoms  She enjoys walking and chair exercises  Please see notes below regarding past medical history, medications, social history including alcohol and tobacco use, and family history. Drug allergies are also listed  Patient answered no to depression screening questions today including 1.  Over the past 2 weeks, have you felt down, depressed or hopeless? 2.  Over the past 2 weeks have you felt little interest and pleasure in doing things?  Patient's get up and go test is normal, less than 30 seconds She states no help needed with using her phone, transportation, shopping, meal prep, housework, laundry, medication, or finances She denies presence of any trip and fall hazards  in the home, no issues with poor lighting or lack of handrails She has noted some difficulty with hearing.  She is already scheduled a hearing evaluation  EKG was performed recently, 08/16/2022.  This is reviewed  Advanced directives: pt has completed and they are on file in her chart   Preventative services are discussed and addressed.  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   Acute deep vein thrombosis (DVT) of femoral vein of left lower extremity (HCC) 08/16/2022   Internal hemorrhoids 07/24/2022   Thrombophlebitis of superficial veins of lower extremity 07/24/2022   History of DVT (deep vein thrombosis) 07/24/2022   Viral URI 04/10/2022   History of colonic polyps 07/15/2021   Rectocele 11/13/2016   Incomplete uterine prolapse 11/13/2016   Cystocele, midline 11/13/2016   Seasonal allergies 09/17/2014   Incontinence    Obesity (BMI 30-39.9) 12/24/2010   Hx of laparoscopic gastric banding 09/23/2010    Past Medical History:  Diagnosis Date   Anal fissure    Arthritis    Biliary colic 5/96   ERCP w/spincterotomy   Condyloma    Deep vein thrombosis (DVT) of lower extremity (HCC)    Fatigue    loss of sleep   Generalized headaches  GERD (gastroesophageal reflux disease)    Hemorrhoid    Hiatal hernia    Migraines    Obesity    Phlebitis of leg    PONV (postoperative nausea and vomiting)    Primary osteoarthritis of both knees    Stress incontinence    Wilson's disease     Past Surgical History:  Procedure Laterality Date   ABDOMINAL HYSTERECTOMY     ANAL FISSURECTOMY  1986   ANTERIOR AND POSTERIOR REPAIR N/A 11/24/2016   Procedure: ANTERIOR (CYSTOCELE) AND POSTERIOR REPAIR (RECTOCELE);  Surgeon: Alfredo Martinez, MD;  Location: [facility];  Service: Urology;  Laterality: N/A;    BUNIONECTOMY Bilateral 2005   CESAREAN SECTION  1990   prolapsed cord   CYSTOSCOPY N/A 11/24/2016   Procedure: CYSTOSCOPY;  Surgeon: Alfredo Martinez, MD;  Location: [facility];  Service: Urology;  Laterality: N/A;   ENDOVENOUS ABLATION SAPHENOUS VEIN W/ LASER  2008   both lower legs   LAPAROSCOPIC CHOLECYSTECTOMY  1997   LAPAROSCOPIC GASTRIC SLEEVE RESECTION WITH HIATAL HERNIA REPAIR  09/16/2010   LAPAROSCOPIC VAGINAL HYSTERECTOMY WITH SALPINGECTOMY Bilateral 11/24/2016   Procedure: LAPAROSCOPIC ASSISTED VAGINAL HYSTERECTOMY WITH SALPINGO-OOPHORECTOMY;  Surgeon: [name], MD;  Location: [facility];  Service: Gynecology;  Laterality: Bilateral;   LOWER EXTREMITY VENOGRAPHY  08/19/2022   Procedure: LOWER EXTREMITY VENOGRAPHY;  Surgeon: Victorino Sparrow, MD;  Location: [facility] INVASIVE CV LAB;  Service: Cardiovascular;;   PERIPHERAL VASCULAR THROMBECTOMY N/A 08/19/2022   Procedure: PERIPHERAL VASCULAR THROMBECTOMY;  Surgeon: Victorino Sparrow, MD;  Location: [facility] INVASIVE CV LAB;  Service: Cardiovascular;  Laterality: N/A;   PERIPHERAL VASCULAR ULTRASOUND/IVUS  08/19/2022   Procedure: Peripheral Vascular Ultrasound/IVUS;  Surgeon: Victorino Sparrow, MD;  Location[facility] CV LAB;  Service: Cardiovascular;;    Social History   Tobacco Use   Smoking status: Never   Smokeless tobacco: Never  Vaping Use   Vaping status: Never Used  Substance Use Topics   Alcohol use: Yes    Alcohol/week: 1.0 standard drink of alcohol    Types: 1 Glasses of wine per week   Drug use: No    Family History  Problem Relation Age of Onset   Diabetes Father    Colon polyps Father    Osteoarthritis Mother    Hypertension Brother    Cancer Maternal Uncle        leukemia   Cancer Maternal Grandmother        un[place]    Allergies  Allergen Reactions   Penicillins Itching and Rash   Adhesive [Tape] Other (See Comments)    Band-Aid causes blisters   Paxlovid [Nirmatrelvir-Ritonavir] Other (See Comments)    Severe  allergic reaction, throat closed up and caused blisters all around the mouth.     Medication list has been reviewed and updated.  Current Outpatient Medications on File Prior to Visit  Medication Sig Dispense Refill   acetaminophen (TYLENOL) 500 MG tablet Take 500 mg by mouth every 6 (six) hours as needed for mild pain or moderate pain.     apixaban (ELIQUIS) 5 MG TABS tablet Take 1 tablet (5 mg total) by mouth 2 (two) times daily. 180 tablet 3   Cholecalciferol (VITAMIN D3) 125 MCG (5000 UT) CAPS Take 5,000 Units by mouth daily.     Cyanocobalamin (B-12 PO) Take 1 tablet by mouth daily.     doxycycline (VIBRA-TABS) 100 MG tablet Take 1 tablet (100 mg total) by mouth 2 (two) times daily. 14 tablet 0  EPINEPHrine (EPIPEN 2-PAK) 0.3 mg/0.3 mL IJ SOAJ injection Inject 0.3 mg into the muscle as needed for anaphylaxis. 2 each PRN   loratadine (CLARITIN) 5 MG chewable tablet Chew 5 mg by mouth daily.     Multiple Vitamin (MULTIVITAMIN) tablet Take 1 tablet by mouth daily.     Multiple Vitamins-Minerals (EMERGEN-C IMMUNE) PACK Take 0.5 packets by mouth daily.     No current facility-administered medications on file prior to visit.    Review of Systems:  As per HPI- otherwise negative.   Physical Examination: Vitals:   11/25/22 1355  BP: 120/84  Pulse: 75  Resp: 18  Temp: 98 F (36.7 C)  SpO2: 98%   Vitals:   11/25/22 1355  Weight: 219 lb 6.4 oz (99.5 kg)  Height: 5' 8.5" (1.74 m)   Body mass index is 32.87 kg/m. Ideal Body Weight: Weight in (lb) to have BMI = 25: 166.5  GEN: no acute distress.  Mild obesity, looks well HEENT: Atraumatic, Normocephalic.  Ears and Nose: No external deformity. CV: RRR, No M/G/R. No JVD. No thrill. No extra heart sounds. PULM: CTA B, no wheezes, crackles, rhonchi. No retractions. No resp. distress. No accessory muscle use. ABD: S, NT, ND, +BS. No rebound. No HSM. EXTR: No c/c/e PSYCH: Normally interactive. Conversant.  Left ring finger-  there is a small area of redness at the PIP joint.  Patient notes this has improved significantly since she started on doxycycline Range of motion his finger is normal except for some stiffness at the PIP  Assessment and Plan: Welcome to Medicare preventive visit - Plan: Pneumococcal conjugate vaccine 20-valent (Prevnar 20)  Need for pneumococcal 20-valent conjugate vaccination Patient seen today for a welcome to Medicare visit Updated pneumococcal vaccination today Recommended hearing evaluation, Medicare annual wellness in 1 year   Signed Abbe Amsterdam, MD

## 2022-11-24 ENCOUNTER — Inpatient Hospital Stay: Payer: Medicare Other | Admitting: Hematology & Oncology

## 2022-11-24 ENCOUNTER — Encounter: Payer: Self-pay | Admitting: Hematology & Oncology

## 2022-11-24 ENCOUNTER — Inpatient Hospital Stay: Payer: Medicare Other | Attending: Hematology & Oncology

## 2022-11-24 VITALS — BP 113/48 | HR 63 | Temp 98.0°F | Resp 18 | Ht 68.5 in | Wt 217.0 lb

## 2022-11-24 DIAGNOSIS — Z7901 Long term (current) use of anticoagulants: Secondary | ICD-10-CM | POA: Insufficient documentation

## 2022-11-24 DIAGNOSIS — I2699 Other pulmonary embolism without acute cor pulmonale: Secondary | ICD-10-CM | POA: Diagnosis not present

## 2022-11-24 DIAGNOSIS — I2609 Other pulmonary embolism with acute cor pulmonale: Secondary | ICD-10-CM

## 2022-11-24 DIAGNOSIS — Z86718 Personal history of other venous thrombosis and embolism: Secondary | ICD-10-CM

## 2022-11-24 LAB — CMP (CANCER CENTER ONLY)
ALT: 10 U/L (ref 0–44)
AST: 14 U/L — ABNORMAL LOW (ref 15–41)
Albumin: 4.1 g/dL (ref 3.5–5.0)
Alkaline Phosphatase: 51 U/L (ref 38–126)
Anion gap: 6 (ref 5–15)
BUN: 17 mg/dL (ref 8–23)
CO2: 30 mmol/L (ref 22–32)
Calcium: 9.2 mg/dL (ref 8.9–10.3)
Chloride: 106 mmol/L (ref 98–111)
Creatinine: 0.79 mg/dL (ref 0.44–1.00)
GFR, Estimated: >60 mL/min (ref >60)
Glucose, Bld: 119 mg/dL — ABNORMAL HIGH (ref 70–99)
Potassium: 4.8 mmol/L (ref 3.5–5.1)
Sodium: 142 mmol/L (ref 135–145)
Total Bilirubin: 0.5 mg/dL (ref 0.3–1.2)
Total Protein: 6.6 g/dL (ref 6.5–8.1)

## 2022-11-24 LAB — CBC WITH DIFFERENTIAL (CANCER CENTER ONLY)
Abs Immature Granulocytes: 0.02 10*3/uL (ref 0.00–0.07)
Basophils Absolute: 0.1 10*3/uL (ref 0.0–0.1)
Basophils Relative: 1 %
Eosinophils Absolute: 0.1 10*3/uL (ref 0.0–0.5)
Eosinophils Relative: 3 %
HCT: 41.8 % (ref 36.0–46.0)
Hemoglobin: 13.7 g/dL (ref 12.0–15.0)
Immature Granulocytes: 0 %
Lymphocytes Relative: 28 %
Lymphs Abs: 1.3 10*3/uL (ref 0.7–4.0)
MCH: 31.2 pg (ref 26.0–34.0)
MCHC: 32.8 g/dL (ref 30.0–36.0)
MCV: 95.2 fL (ref 80.0–100.0)
Monocytes Absolute: 0.4 10*3/uL (ref 0.1–1.0)
Monocytes Relative: 8 %
Neutro Abs: 2.8 10*3/uL (ref 1.7–7.7)
Neutrophils Relative %: 60 %
Platelet Count: UNDETERMINED 10*3/uL (ref 150–400)
RBC: 4.39 MIL/uL (ref 3.87–5.11)
RDW: 12.4 % (ref 11.5–15.5)
Smear Review: NORMAL
WBC Count: 4.7 10*3/uL (ref 4.0–10.5)
nRBC: 0 % (ref 0.0–0.2)

## 2022-11-24 LAB — ANTITHROMBIN III: AntiThromb III Func: 110 % (ref 75–120)

## 2022-11-24 NOTE — Progress Notes (Signed)
Hematology and Oncology Follow Up Visit  Kathryn Pineda [id number] 20-Apr-1955 67 y.o. 11/24/2022   Principle Diagnosis:  Recurrent thromboembolic disease-left leg/pulmonary embolism  Current Therapy:   Eliquis 5 mg p.o. twice daily     Interim History:  Kathryn Pineda is back for follow-up.  This is her second office visit.  We saw her on 09/02/2022.  At that time, she been placed on Eliquis.  She recently had retired from [employer].  She had pain and swelling in the left leg.  She subsequently was found to have a extensive thrombus extending up to the iliac vein.  She subsequently underwent thrombectomy.  This helped quite a bit.  She currently is on Eliquis.  We are doing hypercoagulable studies right now.  So far, we have seen her has not been remarkable for any obvious hypercoagulable state.  She has had no bleeding.  She has had no change in bowel or bladder habits.  She has had some cognitive issues.  I really do not think this is from the Eliquis.  She has had some abdominal discomfort.  This may have been from the thrombectomy that she had.  She has had no fever.  Thankfully, has been no issues with COVID.  She is enjoying retirement.  She did have some cursory hypercoagulable studies back in July.  So far, all that was negative.  I think we did some more extensive studies on her today.  She has been seen by vascular surgery.  I think that they may have done a Doppler of her leg recently.  I need to get another CT angiogram of her chest so we see how the pulmonary emboli look.  Currently, I would say that her performance status is probably ECOG 1.   Medications:  Current Outpatient Medications:    acetaminophen (TYLENOL) 500 MG tablet, Take 500 mg by mouth every 6 (six) hours as needed for mild pain or moderate pain., Disp: , Rfl:    apixaban (ELIQUIS) 5 MG TABS tablet, Take 1 tablet (5 mg total) by mouth 2 (two) times daily., Disp: 180 tablet, Rfl: 3   Cholecalciferol  (VITAMIN D3) 125 MCG (5000 UT) CAPS, Take 5,000 Units by mouth daily., Disp: , Rfl:    Cyanocobalamin (B-12 PO), Take 1 tablet by mouth daily., Disp: , Rfl:    doxycycline (VIBRA-TABS) 100 MG tablet, Take 1 tablet (100 mg total) by mouth 2 (two) times daily., Disp: 14 tablet, Rfl: 0   EPINEPHrine (EPIPEN 2-PAK) 0.3 mg/0.3 mL IJ SOAJ injection, Inject 0.3 mg into the muscle as needed for anaphylaxis., Disp: 2 each, Rfl: PRN   loratadine (CLARITIN) 5 MG chewable tablet, Chew 5 mg by mouth daily., Disp: , Rfl:    Multiple Vitamin (MULTIVITAMIN) tablet, Take 1 tablet by mouth daily., Disp: , Rfl:    Multiple Vitamins-Minerals (EMERGEN-C IMMUNE) PACK, Take 0.5 packets by mouth daily., Disp: , Rfl:   Allergies:  Allergies  Allergen Reactions   Penicillins Itching and Rash   Adhesive [Tape] Other (See Comments)    Band-Aid causes blisters   Paxlovid [Nirmatrelvir-Ritonavir] Other (See Comments)    Severe allergic reaction, throat closed up and caused blisters all around the mouth.     Past Medical History, Surgical history, Social history, and Family History were reviewed and updated.  Review of Systems: Review of Systems  Constitutional: Negative.   HENT:  Negative.    Eyes: Negative.   Respiratory: Negative.    Cardiovascular: Negative.   Gastrointestinal: Negative.  Endocrine: Negative.   Genitourinary: Negative.    Musculoskeletal: Negative.   Skin: Negative.   Neurological: Negative.   Hematological: Negative.   Psychiatric/Behavioral: Negative.      Physical Exam:  height is 5' 8.5" (1.74 m) and weight is 217 lb (98.4 kg). Her oral temperature is 98 F (36.7 C). Her blood pressure is 113/48 (abnormal) and her pulse is 63. Her respiration is 18 and oxygen saturation is 99%.   Wt Readings from Last 3 Encounters:  11/24/22 217 lb (98.4 kg)  11/20/22 220 lb 12.8 oz (100.2 kg)  10/23/22 212 lb 12.8 oz (96.5 kg)    Physical Exam Vitals reviewed.  HENT:     Head:  Normocephalic and atraumatic.  Eyes:     Pupils: Pupils are equal, round, and reactive to light.  Cardiovascular:     Rate and Rhythm: Normal rate and regular rhythm.     Heart sounds: Normal heart sounds.  Pulmonary:     Effort: Pulmonary effort is normal.     Breath sounds: Normal breath sounds.  Abdominal:     General: Bowel sounds are normal.     Palpations: Abdomen is soft.  Musculoskeletal:        General: No tenderness or deformity. Normal range of motion.     Cervical back: Normal range of motion.  Lymphadenopathy:     Cervical: No cervical adenopathy.  Skin:    General: Skin is warm and dry.     Findings: No erythema or rash.  Neurological:     Mental Status: She is alert and oriented to person, place, and time.  Psychiatric:        Behavior: Behavior normal.        Thought Content: Thought content normal.        Judgment: Judgment normal.      Lab Results  Component Value Date   WBC 4.7 11/24/2022   HGB 13.7 11/24/2022   HCT 41.8 11/24/2022   MCV 95.2 11/24/2022   PLT PLATELET CLUMPS NOTED ON SMEAR, UNABLE TO ESTIMATE 11/24/2022     Chemistry      Component Value Date/Time   NA 142 11/24/2022 0839   K 4.8 11/24/2022 0839   CL 106 11/24/2022 0839   CO2 30 11/24/2022 0839   BUN 17 11/24/2022 0839   CREATININE 0.79 11/24/2022 0839   CREATININE 0.77 03/06/2016 1203      Component Value Date/Time   CALCIUM 9.2 11/24/2022 0839   ALKPHOS 51 11/24/2022 0839   AST 14 (L) 11/24/2022 0839   ALT 10 11/24/2022 0839   BILITOT 0.5 11/24/2022 0839      Impression and Plan: Kathryn Pineda is a very nice 67 year old white female.  She is a retired Engineer, civil (consulting).  I really applaud her for all that she did.  She was at [employer].  I hate that she had this thromboembolism.  This is recurrent.  Had to believe that given the significance, that she will probably need lifelong anticoagulation.  Again I think we have to follow-up with a CT angiogram.  Will see about getting  this set up for her in November.  I will go ahead and plan to get her back to see me in December.  We will see what her hypercoagulable studies look like.     Josph Macho, MD 10/8/20242:07 PM

## 2022-11-25 ENCOUNTER — Ambulatory Visit: Payer: Medicare Other | Admitting: Family Medicine

## 2022-11-25 ENCOUNTER — Encounter: Payer: Self-pay | Admitting: Family Medicine

## 2022-11-25 VITALS — BP 120/84 | HR 75 | Temp 98.0°F | Resp 18 | Ht 68.5 in | Wt 219.4 lb

## 2022-11-25 DIAGNOSIS — Z0000 Encounter for general adult medical examination without abnormal findings: Secondary | ICD-10-CM

## 2022-11-25 DIAGNOSIS — Z23 Encounter for immunization: Secondary | ICD-10-CM

## 2022-11-25 LAB — BETA-2-GLYCOPROTEIN I ABS, IGG/M/A
Beta-2 Glyco I IgG: <9 GPI IgG units (ref 0–20)
Beta-2-Glycoprotein I IgA: <9 GPI IgA units (ref 0–25)
Beta-2-Glycoprotein I IgM: <9 GPI IgM units (ref 0–32)

## 2022-11-25 LAB — PROTEIN C ACTIVITY: Protein C Activity: 125 % (ref 73–180)

## 2022-11-25 LAB — HOMOCYSTEINE: Homocysteine: 14.9 umol/L (ref 0.0–17.2)

## 2022-11-25 LAB — LUPUS ANTICOAGULANT PANEL
DRVVT: 38.8 s (ref 0.0–47.0)
PTT Lupus Anticoagulant: 29.2 s (ref 0.0–43.5)

## 2022-11-25 LAB — PROTEIN S, TOTAL: Protein S Ag, Total: 76 % (ref 60–150)

## 2022-11-25 LAB — PROTEIN C, TOTAL: Protein C, Total: 103 % (ref 60–150)

## 2022-11-25 LAB — PROTEIN S ACTIVITY: Protein S Activity: 69 % (ref 63–140)

## 2022-11-25 NOTE — Patient Instructions (Signed)
It was great to see you today!  You got your pneumonia vaccine Please let me know if your finger starts getting worse again

## 2022-11-26 LAB — CARDIOLIPIN ANTIBODIES, IGG, IGM, IGA
Anticardiolipin IgA: <9 [APL'U]/mL (ref 0–11)
Anticardiolipin IgG: <9 [GPL'U]/mL (ref 0–14)
Anticardiolipin IgM: <9 [MPL'U]/mL (ref 0–12)

## 2022-12-01 ENCOUNTER — Other Ambulatory Visit (INDEPENDENT_AMBULATORY_CARE_PROVIDER_SITE_OTHER): Payer: Self-pay

## 2022-12-01 ENCOUNTER — Ambulatory Visit: Payer: Medicare Other | Admitting: Orthopedic Surgery

## 2022-12-01 DIAGNOSIS — M79642 Pain in left hand: Secondary | ICD-10-CM

## 2022-12-01 DIAGNOSIS — S63639A Sprain of interphalangeal joint of unspecified finger, initial encounter: Secondary | ICD-10-CM

## 2022-12-01 DIAGNOSIS — S63635A Sprain of interphalangeal joint of left ring finger, initial encounter: Secondary | ICD-10-CM

## 2022-12-01 LAB — PROTHROMBIN GENE MUTATION

## 2022-12-01 NOTE — Progress Notes (Signed)
Kathryn Pineda - 67 y.o. female MRN [id number]  Date of birth: 02-Feb-1956  Office Visit Note: Visit Date: 12/01/2022 PCP: Pearline Cables, MD Referred by: Pearline Cables, MD  Subjective: No chief complaint on file.  HPI: Kathryn Pineda is a pleasant 67 y.o. female who presents today for for evaluation of a left hand ring finger injury sustained approximately 5 weeks prior.  Injury mechanism described as a pulling injury to the ring finger from a dog leash.  She noted ongoing swelling at the PIP joint of the left ring finger.  There was concern for potential infection from her primary care, she was placed on doxycycline which has been completed.  Pertinent ROS were reviewed with the patient and found to be negative unless otherwise specified above in HPI.   Visit Reason: left hand- ring finger Duration of symptoms: 5 weeks Hand dominance: right Occupation: RetiredAudiological scientist Diabetic: No Smoking: No Heart/Lung History: No Blood Thinners: Eliquis  Prior Testing/EMG: 11/04/22 xrays Injections (Date): none Treatments: anti-biotics Prior Surgery: none  Assessment & Plan: Visit Diagnoses:  1. Pain in left hand     Plan: Clinically, she appears to have sustained a left ring finger PIP joint sprain.  The joint is stable with stress testing today and she has appropriate range of motion, albeit stiffness from immobilization.  I recommended that she begin range of motion exercises of the left hand with emphasis on the ring finger PIP.  Will have her see occupational therapy for exercises with transition to a home program as tolerated.  She can follow-up with me as needed.  Follow-up: No follow-ups on file.   Meds & Orders: No orders of the defined types were placed in this encounter.   Orders Placed This Encounter  Procedures   XR Hand Complete Left   Ambulatory referral to Occupational Therapy     Procedures: No procedures performed      Clinical History: No specialty  comments available.  She reports that she has never smoked. She has never used smokeless tobacco.  Recent Labs    03/23/22 0948  HGBA1C 5.7    Objective:   Vital Signs: There were no vitals taken for this visit.  Physical Exam  Gen: Well-appearing, in no acute distress; non-toxic CV: Regular Rate. Well-perfused. Warm.  Resp: Breathing unlabored on room air; no wheezing. Psych: Fluid speech in conversation; appropriate affect; normal thought process  Ortho Exam Left hand: - Ring finger with moderate swelling at the PIP, pain with passive motion into deep flexion - Range of motion of the ring finger PIP 0-70, slightly improved passively - Remaining digits throughout the hand with appropriate range of motion - Hand is warm well-perfused - No erythema or warmth to the ring finger PIP, no concern for ongoing infection  Imaging: XR Hand Complete Left  Result Date: 12/01/2022 X-rays of the left hand, multiple views were obtained today X-rays demonstrate stable appearance of the radiocarpal and midcarpal articulations, small joints at the MCP, PIP and DIP without significant abnormalities.  No significant bony destruction.  Bone mineralization is appropriate   Past Medical/Family/Surgical/Social History: Medications & Allergies reviewed per EMR, new medications updated. 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   Acute deep vein thrombosis (DVT) of femoral vein of left lower extremity (HCC) 08/16/2022   Internal hemorrhoids 07/24/2022   Thrombophlebitis of superficial veins of  lower extremity 07/24/2022   History of DVT (deep vein thrombosis) 07/24/2022   Viral URI 04/10/2022   History of colonic polyps 07/15/2021   Rectocele 11/13/2016   Incomplete uterine prolapse 11/13/2016   Cystocele, midline 11/13/2016   Seasonal allergies 09/17/2014   Incontinence    Obesity  (BMI 30-39.9) 12/24/2010   Hx of laparoscopic gastric banding 09/23/2010   Past Medical History:  Diagnosis Date   Anal fissure    Arthritis    Biliary colic 5/96   ERCP w/spincterotomy   Condyloma    Deep vein thrombosis (DVT) of lower extremity (HCC)    Fatigue    loss of sleep   Generalized headaches    GERD (gastroesophageal reflux disease)    Hemorrhoid    Hiatal hernia    Migraines    Obesity    Phlebitis of leg    PONV (postoperative nausea and vomiting)    Primary osteoarthritis of both knees    Stress incontinence    Wilson's disease    Family History  Problem Relation Age of Onset   Diabetes Father    Colon polyps Father    Osteoarthritis Mother    Hypertension Brother    Cancer Maternal Uncle        leukemia   Cancer Maternal Grandmother        un[place]   Past Surgical History:  Procedure Laterality Date   ABDOMINAL HYSTERECTOMY     ANAL FISSURECTOMY  1986   ANTERIOR AND POSTERIOR REPAIR N/A 11/24/2016   Procedure: ANTERIOR (CYSTOCELE) AND POSTERIOR REPAIR (RECTOCELE);  Surgeon: Alfredo Martinez, MD;  Location: [facility];  Service: Urology;  Laterality: N/A;   BUNIONECTOMY Bilateral 2005   CESAREAN SECTION  1990   prolapsed cord   CYSTOSCOPY N/A 11/24/2016   Procedure: CYSTOSCOPY;  Surgeon: Alfredo Martinez, MD;  Location: [facility];  Service: Urology;  Laterality: N/A;   ENDOVENOUS ABLATION SAPHENOUS VEIN W/ LASER  2008   both lower legs   LAPAROSCOPIC CHOLECYSTECTOMY  1997   LAPAROSCOPIC GASTRIC SLEEVE RESECTION WITH HIATAL HERNIA REPAIR  09/16/2010   LAPAROSCOPIC VAGINAL HYSTERECTOMY WITH SALPINGECTOMY Bilateral 11/24/2016   Procedure: LAPAROSCOPIC ASSISTED VAGINAL HYSTERECTOMY WITH SALPINGO-OOPHORECTOMY;  Surgeon: [name], MD;  Location: [facility];  Service: Gynecology;  Laterality: Bilateral;   LOWER EXTREMITY VENOGRAPHY  08/19/2022   Procedure: LOWER EXTREMITY VENOGRAPHY;  Surgeon: Victorino Sparrow, MD;  Location: [facility] INVASIVE CV LAB;  Service:  Cardiovascular;;   PERIPHERAL VASCULAR THROMBECTOMY N/A 08/19/2022   Procedure: PERIPHERAL VASCULAR THROMBECTOMY;  Surgeon: Victorino Sparrow, MD;  Location: [facility] INVASIVE CV LAB;  Service: Cardiovascular;  Laterality: N/A;   PERIPHERAL VASCULAR ULTRASOUND/IVUS  08/19/2022   Procedure: Peripheral Vascular Ultrasound/IVUS;  Surgeon: Victorino Sparrow, MD;  Location: [facility] INVASIVE CV LAB;  Service: Cardiovascular;;   Social History   Occupational History   Not on file  Tobacco Use   Smoking status: Never   Smokeless tobacco: Never  Vaping Use   Vaping status: Never Used  Substance and Sexual Activity   Alcohol use: Yes    Alcohol/week: 1.0 standard drink of alcohol    Types: 1 Glasses of wine per week   Drug use: No   Sexual activity: Yes    Partners: Male    Birth control/protection: Other-see comments, Surgical    Comment: [facility] 11/24/16    Mckale Haffey Fara Boros) Denese Killings, M.D. [facility] 12:03 PM

## 2022-12-02 LAB — FACTOR 5 LEIDEN

## 2022-12-09 NOTE — Therapy (Signed)
OUTPATIENT OCCUPATIONAL THERAPY ORTHO EVALUATION  Patient Name: Kathryn Pineda MRN: [id number] DOB:May 31, 1955, 67 y.o., female Today's Date: 12/09/2022  PCP: Abbe Amsterdam, MD REFERRING PROVIDER:  Samuella Cota, MD    END OF SESSION:   Past Medical History:  Diagnosis Date   Anal fissure    Arthritis    Biliary colic 5/96   ERCP w/spincterotomy   Condyloma    Deep vein thrombosis (DVT) of lower extremity (HCC)    Fatigue    loss of sleep   Generalized headaches    GERD (gastroesophageal reflux disease)    Hemorrhoid    Hiatal hernia    Migraines    Obesity    Phlebitis of leg    PONV (postoperative nausea and vomiting)    Primary osteoarthritis of both knees    Stress incontinence    Wilson's disease    Past Surgical History:  Procedure Laterality Date   ABDOMINAL HYSTERECTOMY     ANAL FISSURECTOMY  1986   ANTERIOR AND POSTERIOR REPAIR N/A 11/24/2016   Procedure: ANTERIOR (CYSTOCELE) AND POSTERIOR REPAIR (RECTOCELE);  Surgeon: Alfredo Martinez, MD;  Location: [facility];  Service: Urology;  Laterality: N/A;   BUNIONECTOMY Bilateral 2005   CESAREAN SECTION  1990   prolapsed cord   CYSTOSCOPY N/A 11/24/2016   Procedure: CYSTOSCOPY;  Surgeon: Alfredo Martinez, MD;  Location: [facility];  Service: Urology;  Laterality: N/A;   ENDOVENOUS ABLATION SAPHENOUS VEIN W/ LASER  2008   both lower legs   LAPAROSCOPIC CHOLECYSTECTOMY  1997   LAPAROSCOPIC GASTRIC SLEEVE RESECTION WITH HIATAL HERNIA REPAIR  09/16/2010   LAPAROSCOPIC VAGINAL HYSTERECTOMY WITH SALPINGECTOMY Bilateral 11/24/2016   Procedure: LAPAROSCOPIC ASSISTED VAGINAL HYSTERECTOMY WITH SALPINGO-OOPHORECTOMY;  Surgeon: [name], MD;  Location: [facility];  Service: Gynecology;  Laterality: Bilateral;   LOWER EXTREMITY VENOGRAPHY  08/19/2022   Procedure: LOWER EXTREMITY VENOGRAPHY;  Surgeon: Victorino Sparrow, MD;  Location: [facility] INVASIVE CV LAB;  Service: Cardiovascular;;   PERIPHERAL VASCULAR THROMBECTOMY N/A  08/19/2022   Procedure: PERIPHERAL VASCULAR THROMBECTOMY;  Surgeon: Victorino Sparrow, MD;  Location: [facility] INVASIVE CV LAB;  Service: Cardiovascular;  Laterality: N/A;   PERIPHERAL VASCULAR ULTRASOUND/IVUS  08/19/2022   Procedure: Peripheral Vascular Ultrasound/IVUS;  Surgeon: Victorino Sparrow, MD;  Location: [facility] INVASIVE CV LAB;  Service: Cardiovascular;;   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   Acute deep vein thrombosis (DVT) of femoral vein of left lower extremity (HCC) 08/16/2022   Internal hemorrhoids 07/24/2022   Thrombophlebitis of superficial veins of lower extremity 07/24/2022   History of DVT (deep vein thrombosis) 07/24/2022   Viral URI 04/10/2022   History of colonic polyps 07/15/2021   Rectocele 11/13/2016   Incomplete uterine prolapse 11/13/2016   Cystocele, midline 11/13/2016   Seasonal allergies 09/17/2014   Incontinence    Obesity (BMI 30-39.9) 12/24/2010   Hx of laparoscopic gastric banding 09/23/2010    ONSET DATE: approx 1.5 months ago  REFERRING DIAG: M79.642 (ICD-10-CM) - Pain in left hand   THERAPY DIAG:  No diagnosis found.  Rationale for Evaluation and Treatment: Rehabilitation  SUBJECTIVE:   SUBJECTIVE STATEMENT: She states ***.    PERTINENT HISTORY: Per MD notes: "left hand ring finger injury sustained approximately 5 weeks prior. Injury mechanism described as a pulling injury to the ring finger from a dog leash. She noted ongoing swelling at the PIP joint of the left  ring finger. There was concern for potential infection from her primary care, she was placed on doxycycline which has been completed...appears to have sustained a left ring finger PIP joint sprain. The joint is stable with stress testing today and she has appropriate range of motion, albeit stiffness from immobilization. I recommended that she begin range of motion exercises of  the left hand with emphasis on the ring finger PIP. Will have her see occupational therapy for exercises with transition to a home program as tolerated. She can follow-up with me as needed. "   PRECAUTIONS: {Therapy precautions:24002}  RED FLAGS: {PT Red Flags:29287}   WEIGHT BEARING RESTRICTIONS: {Yes ***/No:24003}  PAIN:  Are you having pain? {OPRCPAIN:27236}  FALLS: Has patient fallen in last 6 months? {fallsyesno:27318}  LIVING ENVIRONMENT: Lives with: {OPRC lives with:25569::"lives with their family"} Lives in: {Lives in:25570} Stairs: {opstairs:27293} Has following equipment at home: {Assistive devices:23999}  PLOF: {PLOF:24004}  PATIENT GOALS: ***  NEXT MD VISIT: ***  OBJECTIVE: (All objective assessments below are from initial evaluation on: *** unless otherwise specified.)    HAND DOMINANCE: Right ***  ADLs: Overall ADLs: States decreased ability to grab, hold household objects, pain and difficulty to open containers, perform FMS tasks (manipulate fasteners on clothing), mild to moderate bathing problems as well. ***   FUNCTIONAL OUTCOME MEASURES: Eval: Quick DASH ***% impairment today  (Higher % Score  =  More Impairment)    Patient Specific Functional Scale: *** (***, ***, ***)  (Higher Score  =  Better Ability for the Selected Tasks)     Patient Rated Wrist Evaluation (PRWE): Pain: ***/50; Function: ***/50; Total Score: ***/100 (Higher Score  =  More Pain and/or Debility)    UPPER EXTREMITY ROM     Shoulder to Wrist AROM Right eval Left eval  Shoulder flexion    Shoulder abduction    Shoulder extension    Shoulder internal rotation    Shoulder external rotation    Elbow flexion    Elbow extension    Forearm supination    Forearm pronation     Wrist flexion    Wrist extension    Wrist ulnar deviation    Wrist radial deviation    Functional dart thrower's motion (F-DTM) in ulnar flexion    F-DTM in radial extension     (Blank rows = not  tested)   Hand AROM Right eval Left eval  Full Fist Ability (or Gap to Distal Palmar Crease)    Thumb Opposition  (Kapandji Scale)     Thumb MCP (0-60)    Thumb IP (0-80)    Thumb Radial Abduction Span     Thumb Palmar Abduction Span     Index MCP (0-90)     Index PIP (0-100)     Index DIP (0-70)      Long MCP (0-90)      Long PIP (0-100)      Long DIP (0-70)      Ring MCP (0-90)      Ring PIP (0-100)      Ring DIP (0-70)      Little MCP (0-90)      Little PIP (0-100)      Little DIP (0-70)      (Blank rows = not tested)   UPPER EXTREMITY MMT:    Eval: *** NT at eval due to recent and still healing injuries. Will be tested when appropriate.   MMT Right TBD Left TBD  Shoulder flexion    Shoulder  abduction    Shoulder adduction    Shoulder extension    Shoulder internal rotation    Shoulder external rotation    Middle trapezius    Lower trapezius    Elbow flexion    Elbow extension    Forearm supination    Forearm pronation    Wrist flexion    Wrist extension    Wrist ulnar deviation    Wrist radial deviation    (Blank rows = not tested)  HAND FUNCTION: Eval: Observed weakness in affected *** hand.  Grip strength Right: *** lbs, Left: *** lbs   COORDINATION: Eval: Observed coordination impairments with affected *** hand. Box and Blocks Test: *** Blocks today (*** is [facility]); 9 Hole Peg Test Right: ***sec, Left: *** sec (*** sec is WFL)   SENSATION: Eval: *** Light touch intact today, though diminished around sx area    EDEMA:   Eval: *** Mildly swollen in *** hand and wrist today, ***cm circumferentially around ***  COGNITION: Eval: Overall cognitive status: WFL for evaluation today ***  OBSERVATIONS:   Eval: ***   TODAY'S TREATMENT:  Post-evaluation treatment: ***    PATIENT EDUCATION: Education details: See tx section above for details  Person educated: Patient Education method: Engineer, structural, Teach back, Handouts  Education  comprehension: States and demonstrates understanding, Additional Education required    HOME EXERCISE PROGRAM: See tx section above for details    GOALS: Goals reviewed with patient? Yes   SHORT TERM GOALS: (STG required if POC>30 days) Target Date: ***  Pt will obtain protective, custom orthotic. Goal status: TBD/PRN,  MET ***  2.  Pt will demo/state understanding of initial HEP to improve pain levels and prerequisite motion. Goal status: INITIAL   LONG TERM GOALS: Target Date: ***  Pt will improve functional ability by decreased impairment per Quick DASH / PSFS / PRWE assessment from *** to *** or better, for better quality of life. Goal status: INITIAL  2.  Pt will improve grip strength in *** hand from ***lbs to at least ***lbs for functional use at home and in IADLs. Goal status: INITIAL  3.  Pt will improve A/ROM in *** from *** to at least ***, to have functional motion for tasks like reach and grasp.  Goal status: INITIAL  4.  Pt will improve strength in *** from *** MMT to at least *** MMT to have increased functional ability to carry out selfcare and higher-level homecare tasks with less difficulty. Goal status: INITIAL  5.  Pt will improve coordination skills in ***, as seen by better score on *** testing to have increased functional ability to carry out fine motor tasks (fasteners, etc.) and more complex, coordinated IADLs (meal prep, sports, etc.).  Goal status: INITIAL  6.  Pt will decrease pain at worst from ***/10 to ***/10 or better to have better sleep and occupational participation in daily roles. Goal status: INITIAL   ASSESSMENT:  CLINICAL IMPRESSION: Patient is a 67 y.o. female who was seen today for occupational therapy evaluation for ***.   PERFORMANCE DEFICITS: in functional skills including {OT physical skills:25468}, cognitive skills including {OT cognitive skills:25469}, and psychosocial skills including {OT psychosocial skills:25470}.    IMPAIRMENTS: are limiting patient from {OT performance deficits:25471}.   COMORBIDITIES: {Comorbidities:25485} that affects occupational performance. Patient will benefit from skilled OT to address above impairments and improve overall function.  MODIFICATION OR ASSISTANCE TO COMPLETE EVALUATION: {OT modification:25474}  OT OCCUPATIONAL PROFILE AND HISTORY: {OT PROFILE AND QMVHQIO:96295}  CLINICAL DECISION MAKING: {OT CDM:25475}  REHAB POTENTIAL: {rehabpotential:25112}  EVALUATION COMPLEXITY: {Evaluation complexity:25115}      PLAN:  OT FREQUENCY: {rehab frequency:25116}  OT DURATION: {rehab duration:25117}  PLANNED INTERVENTIONS: {OT Interventions:25467}  RECOMMENDED OTHER SERVICES: ***  CONSULTED AND AGREED WITH PLAN OF CARE: {WUJ:81191}  PLAN FOR NEXT SESSION: ***   Fannie Knee, OTR/L, CHT 12/09/2022, 2:28 PM

## 2022-12-10 ENCOUNTER — Other Ambulatory Visit (HOSPITAL_BASED_OUTPATIENT_CLINIC_OR_DEPARTMENT_OTHER): Payer: Self-pay

## 2022-12-10 ENCOUNTER — Ambulatory Visit: Payer: Medicare Other | Admitting: Rehabilitative and Restorative Service Providers"

## 2022-12-10 ENCOUNTER — Other Ambulatory Visit: Payer: Self-pay

## 2022-12-10 ENCOUNTER — Encounter: Payer: Self-pay | Admitting: Rehabilitative and Restorative Service Providers"

## 2022-12-10 DIAGNOSIS — M25542 Pain in joints of left hand: Secondary | ICD-10-CM | POA: Diagnosis not present

## 2022-12-10 DIAGNOSIS — M6281 Muscle weakness (generalized): Secondary | ICD-10-CM

## 2022-12-10 DIAGNOSIS — M25642 Stiffness of left hand, not elsewhere classified: Secondary | ICD-10-CM

## 2022-12-13 ENCOUNTER — Encounter: Payer: Self-pay | Admitting: Family Medicine

## 2022-12-21 ENCOUNTER — Encounter (HOSPITAL_BASED_OUTPATIENT_CLINIC_OR_DEPARTMENT_OTHER): Payer: Self-pay

## 2022-12-21 ENCOUNTER — Ambulatory Visit (HOSPITAL_BASED_OUTPATIENT_CLINIC_OR_DEPARTMENT_OTHER)
Admission: RE | Admit: 2022-12-21 | Discharge: 2022-12-21 | Disposition: A | Discharge status: Home / Self Care | Payer: Medicare Other | Source: Ambulatory Visit | Attending: Hematology & Oncology | Admitting: Hematology & Oncology

## 2022-12-21 ENCOUNTER — Encounter: Payer: Self-pay | Admitting: *Deleted

## 2022-12-21 DIAGNOSIS — Z9049 Acquired absence of other specified parts of digestive tract: Secondary | ICD-10-CM | POA: Diagnosis not present

## 2022-12-21 DIAGNOSIS — I2609 Other pulmonary embolism with acute cor pulmonale: Secondary | ICD-10-CM | POA: Insufficient documentation

## 2022-12-21 DIAGNOSIS — I700 Atherosclerosis of aorta: Secondary | ICD-10-CM | POA: Diagnosis not present

## 2022-12-21 DIAGNOSIS — I517 Cardiomegaly: Secondary | ICD-10-CM | POA: Diagnosis not present

## 2022-12-21 DIAGNOSIS — K449 Diaphragmatic hernia without obstruction or gangrene: Secondary | ICD-10-CM | POA: Diagnosis not present

## 2022-12-21 MED ORDER — IOHEXOL 350 MG/ML SOLN
100.0000 mL | Freq: Once | INTRAVENOUS | Status: AC | PRN
  Administered 2022-12-21: 100 mL via INTRAVENOUS

## 2023-01-12 DIAGNOSIS — H903 Sensorineural hearing loss, bilateral: Secondary | ICD-10-CM | POA: Diagnosis not present

## 2023-02-02 NOTE — Progress Notes (Unsigned)
New Patient Evaluation and Consultation  Referring Provider: [name], MD PCP: Pearline Cables, MD Date of Service: 02/03/2023  SUBJECTIVE Chief Complaint: No chief complaint on file.  History of Present Illness: Kathryn Pineda is a 67 y.o. White or Caucasian female seen in consultation at the request of Dr [name] for evaluation of recurrent pelvic organ prolapse and fecal smearing.    S/p LAVH, anterior and posterior repair by Dr. [name] and Dr. Sherron Monday 11/24/16  ***Review of records significant for: ***History left femoral DVT and PE with hospital admission 08/16/22, hiatal hernia, s/p lap band, Wilson's disease  Urinary Symptoms: {urine leakage?:24754} Leaks *** time(s) per {days/wks/mos/yrs:310907}.  Pad use: {NUMBERS 1-10:18281} {pad option:24752} per day.   Patient {ACTION; IS/IS YNW:29562130} bothered by UI symptoms.  Day time voids ***.  Nocturia: *** times per night to void. Voiding dysfunction:  {empties:24755} bladder well.  Patient {DOES NOT does:27190::"does not"} use a catheter to empty bladder.  When urinating, patient feels {urine symptoms:24756} Drinks: *** per day  UTIs: {NUMBERS 1-10:18281} UTI's in the last year.   {ACTIONS;DENIES/REPORTS:21021675::"Denies"} history of {urologic concerns:24757} No results found for the last 90 days.   Pelvic Organ Prolapse Symptoms:                  Patient {denies/ admits to:24761} a feeling of a bulge the vaginal area. It has been present for {NUMBER 1-10:22536} {days/wks/mos/yrs:310907}.  Patient {denies/ admits to:24761} seeing a bulge.  This bulge {ACTION; IS/IS QMV:78469629} bothersome.  Bowel Symptom: Bowel movements: *** time(s) per {Time; day/week/month:13537} with a history of anal fissure Stool consistency: {stool consistency:24758} Straining: {yes/no:19897}.  Splinting: {yes/no:19897}.  Incomplete evacuation: {yes/no:19897}.  Patient {denies/ admits to:24761} accidental bowel leakage / fecal  incontinence  Occurs: *** time(s) per {Time; day/week/month:13537}  Consistency with leakage: {stool consistency:24758} Bowel regimen: {bowel regimen:24759} Last colonoscopy: Results with external and internal hemorrhoids, due for repeat in 5 yrs [facility] Colonoscopy          Upcoming     Colonoscopy (Every 5 Years) Next due on 10/31/2025    10/31/2020  [facility] COLONOSCOPY   Only the first 1 history entries have been loaded, but more history exists.                Sexual Function Sexually active: {yes/no:19897}.  Sexual orientation: {Sexual Orientation:[phone]} Pain with sex: {pain with sex:24762}avoided estrogen due to history of DVT and PE  Pelvic Pain {denies/ admits to:24761} pelvic pain Location: *** Pain occurs: *** Prior pain treatment: *** Improved by: *** Worsened by: ***   Past Medical History:  Past Medical History:  Diagnosis Date   Anal fissure    Arthritis    Biliary colic 5/96   ERCP w/spincterotomy   Condyloma    Deep vein thrombosis (DVT) of lower extremity (HCC)    Fatigue    loss of sleep   Generalized headaches    GERD (gastroesophageal reflux disease)    Hemorrhoid    Hiatal hernia    Migraines    Obesity    Phlebitis of leg    PONV (postoperative nausea and vomiting)    Primary osteoarthritis of both knees    Stress incontinence    Wilson's disease      Past Surgical History:   Past Surgical History:  Procedure Laterality Date   ABDOMINAL HYSTERECTOMY     ANAL FISSURECTOMY  1986   ANTERIOR AND POSTERIOR REPAIR N/A 11/24/2016   Procedure: ANTERIOR (CYSTOCELE) AND POSTERIOR REPAIR (RECTOCELE);  Surgeon: Sherron Monday,  Lorin Picket, MD;  Location: [facility];  Service: Urology;  Laterality: N/A;   BUNIONECTOMY Bilateral 2005   CESAREAN SECTION  1990   prolapsed cord   CYSTOSCOPY N/A 11/24/2016   Procedure: CYSTOSCOPY;  Surgeon: Alfredo Martinez, MD;  Location: [facility];  Service: Urology;  Laterality: N/A;   ENDOVENOUS ABLATION SAPHENOUS VEIN W/  LASER  2008   both lower legs   LAPAROSCOPIC CHOLECYSTECTOMY  1997   LAPAROSCOPIC GASTRIC SLEEVE RESECTION WITH HIATAL HERNIA REPAIR  09/16/2010   LAPAROSCOPIC VAGINAL HYSTERECTOMY WITH SALPINGECTOMY Bilateral 11/24/2016   Procedure: LAPAROSCOPIC ASSISTED VAGINAL HYSTERECTOMY WITH SALPINGO-OOPHORECTOMY;  Surgeon: [name], MD;  Location: [facility];  Service: Gynecology;  Laterality: Bilateral;   LOWER EXTREMITY VENOGRAPHY  08/19/2022   Procedure: LOWER EXTREMITY VENOGRAPHY;  Surgeon: Victorino Sparrow, MD;  Location: [facility] INVASIVE CV LAB;  Service: Cardiovascular;;   PERIPHERAL VASCULAR THROMBECTOMY N/A 08/19/2022   Procedure: PERIPHERAL VASCULAR THROMBECTOMY;  Surgeon: Victorino Sparrow, MD;  Location: [facility] INVASIVE CV LAB;  Service: Cardiovascular;  Laterality: N/A;   PERIPHERAL VASCULAR ULTRASOUND/IVUS  08/19/2022   Procedure: Peripheral Vascular Ultrasound/IVUS;  Surgeon: Victorino Sparrow, MD;  Location: [facility] INVASIVE CV LAB;  Service: Cardiovascular;;     Past OB/GYN History: OB History  Gravida Para Term Preterm AB Living  4 4 4   3   SAB IAB Ectopic Multiple Live Births          # Outcome Date GA Lbr Len/2nd Weight Sex Type Anes PTL Lv  4 Term      VBAC     3 Term      CS-LTranv     2 Term      Vag-Spont     1 Term      Vag-Spont       Vaginal deliveries: 3,  Forceps/ Vacuum deliveries: ***, Cesarean section: 1 Menopausal: Yes, at age ***, {denies/ admits to:24761} vaginal bleeding since menopause Contraception: n/a. Last pap smear was 03/06/16 NILM ***.  Any history of abnormal pap smears: yes. No results found for: "DIAGPAP", "HPVHIGH", "ADEQPAP"  Medications: Patient has a current medication list which includes the following prescription(s): acetaminophen, apixaban, vitamin d3, cyanocobalamin, doxycycline, epinephrine, loratadine, multivitamin, and emergen-c immune.   Allergies: Patient is allergic to penicillins, adhesive [tape], and paxlovid [nirmatrelvir-ritonavir].   Social  History:  Social History   Tobacco Use   Smoking status: Never   Smokeless tobacco: Never  Vaping Use   Vaping status: Never Used  Substance Use Topics   Alcohol use: Yes    Alcohol/week: 1.0 standard drink of alcohol    Types: 1 Glasses of wine per week   Drug use: No    Relationship status: {relationship status:24764} Patient lives with ***.   Patient {ACTION; IS/IS HQI:69629528} employed ***. Regular exercise: {Yes/No:304960894} History of abuse: {Yes/No:304960894}  Family History:   Family History  Problem Relation Age of Onset   Diabetes Father    Colon polyps Father    Osteoarthritis Mother    Hypertension Brother    Cancer Maternal Uncle        leukemia   Cancer Maternal Grandmother        unaware     Review of Systems: ROS   OBJECTIVE Physical Exam: There were no vitals filed for this visit.  Physical Exam Constitutional:      General: She is not in acute distress.    Appearance: Normal appearance.  Genitourinary:     Bladder and urethral meatus normal.  No lesions in the vagina.     Right Labia: No rash, tenderness, lesions, skin changes or Bartholin's cyst.    Left Labia: No tenderness, lesions, skin changes, Bartholin's cyst or rash.    No vaginal discharge, erythema, tenderness, bleeding, ulceration or granulation tissue.      Right Adnexa: not tender, not full and no mass present.    Left Adnexa: not tender, not full and no mass present.    Cervix is absent.     Uterus is absent.     Urethral meatus caruncle not present.    No urethral prolapse, tenderness, mass, hypermobility or discharge present.     Bladder is not tender, urgency on palpation not present and masses not present.      Levator ani not tender, obturator internus not tender, no asymmetrical contractions present and no pelvic spasms present.    Anal wink present and BC reflex present. Cardiovascular:     Rate and Rhythm: Normal rate.  Pulmonary:     Effort: Pulmonary  effort is normal. No respiratory distress.  Abdominal:     General: There is no distension.     Palpations: There is no mass.     Tenderness: There is no abdominal tenderness.     Hernia: No hernia is present.  Neurological:     Mental Status: She is alert.  Vitals reviewed. Exam conducted with a chaperone present.     POP-Q:   POP-Q                                               Aa                                               Ba                                                 C                                                Gh                                               Pb                                               tvl                                                Ap  Bp                                                 D      Rectal Exam:  Normal sphincter tone, {rectocele:24766} distal rectocele, enterocoele {DESC; PRESENT/NOT PRESENT:21021351}, no rectal masses, {sign of:24767} dyssynergia when asking the patient to bear down.  Post-Void Residual (PVR) by Bladder Scan: In order to evaluate bladder emptying, we discussed obtaining a postvoid residual and patient agreed to this procedure.  Procedure: The ultrasound unit was placed on the patient's abdomen in the suprapubic region after the patient had voided.      Laboratory Results: Lab Results  Component Value Date   COLORU yellow 10/08/2022   CLARITYU cloudy 10/08/2022   GLUCOSEUR Negative 10/08/2022   BILIRUBINUR negative 10/08/2022   KETONESU negative 10/08/2022   SPECGRAV 1.010 10/08/2022   RBCUR large 10/08/2022   PHUR 6.0 10/08/2022   PROTEINUR Positive (A) 10/08/2022   UROBILINOGEN 0.2 10/08/2022   LEUKOCYTESUR Large (3+) (A) 10/08/2022    Lab Results  Component Value Date   CREATININE 0.79 11/24/2022   CREATININE 0.75 09/02/2022   CREATININE 0.82 08/28/2022    Lab Results  Component Value Date   HGBA1C 5.7 03/23/2022    Lab  Results  Component Value Date   HGB 13.7 11/24/2022     ASSESSMENT AND PLAN Ms. Golliday is a 67 y.o. with: No diagnosis found.  There are no diagnoses linked to this encounter.  Time spent: I spent *** minutes dedicated to the care of this patient on the date of this encounter to include pre-visit review of records, face-to-face time with the patient discussing *** and post visit documentation and ordering medication/ testing.    Loleta Chance, MD

## 2023-02-03 ENCOUNTER — Encounter: Payer: Self-pay | Admitting: Obstetrics

## 2023-02-03 ENCOUNTER — Encounter: Payer: Self-pay | Admitting: Hematology & Oncology

## 2023-02-03 ENCOUNTER — Other Ambulatory Visit (HOSPITAL_BASED_OUTPATIENT_CLINIC_OR_DEPARTMENT_OTHER): Payer: Self-pay

## 2023-02-03 ENCOUNTER — Inpatient Hospital Stay: Payer: Medicare Other | Admitting: Hematology & Oncology

## 2023-02-03 ENCOUNTER — Ambulatory Visit: Payer: Medicare Other | Admitting: Obstetrics

## 2023-02-03 ENCOUNTER — Inpatient Hospital Stay: Payer: Medicare Other | Attending: Hematology & Oncology

## 2023-02-03 VITALS — BP 118/76 | HR 68 | Ht 67.52 in | Wt 223.0 lb

## 2023-02-03 VITALS — BP 105/51 | HR 76 | Temp 98.1°F | Resp 18 | Wt 223.0 lb

## 2023-02-03 DIAGNOSIS — N8110 Cystocele, unspecified: Secondary | ICD-10-CM

## 2023-02-03 DIAGNOSIS — Z7901 Long term (current) use of anticoagulants: Secondary | ICD-10-CM | POA: Insufficient documentation

## 2023-02-03 DIAGNOSIS — N3941 Urge incontinence: Secondary | ICD-10-CM

## 2023-02-03 DIAGNOSIS — N952 Postmenopausal atrophic vaginitis: Secondary | ICD-10-CM

## 2023-02-03 DIAGNOSIS — N816 Rectocele: Secondary | ICD-10-CM

## 2023-02-03 DIAGNOSIS — I2609 Other pulmonary embolism with acute cor pulmonale: Secondary | ICD-10-CM

## 2023-02-03 DIAGNOSIS — R159 Full incontinence of feces: Secondary | ICD-10-CM | POA: Diagnosis not present

## 2023-02-03 DIAGNOSIS — I82412 Acute embolism and thrombosis of left femoral vein: Secondary | ICD-10-CM

## 2023-02-03 DIAGNOSIS — I2699 Other pulmonary embolism without acute cor pulmonale: Secondary | ICD-10-CM | POA: Insufficient documentation

## 2023-02-03 DIAGNOSIS — R8290 Unspecified abnormal findings in urine: Secondary | ICD-10-CM

## 2023-02-03 DIAGNOSIS — I82502 Chronic embolism and thrombosis of unspecified deep veins of left lower extremity: Secondary | ICD-10-CM | POA: Diagnosis not present

## 2023-02-03 LAB — CBC WITH DIFFERENTIAL (CANCER CENTER ONLY)
Abs Immature Granulocytes: 0.02 10*3/uL (ref 0.00–0.07)
Basophils Absolute: 0.1 10*3/uL (ref 0.0–0.1)
Basophils Relative: 1 %
Eosinophils Absolute: 0.1 10*3/uL (ref 0.0–0.5)
Eosinophils Relative: 2 %
HCT: 38.1 % (ref 36.0–46.0)
Hemoglobin: 12.6 g/dL (ref 12.0–15.0)
Immature Granulocytes: 0 %
Lymphocytes Relative: 22 %
Lymphs Abs: 1.2 10*3/uL (ref 0.7–4.0)
MCH: 31.3 pg (ref 26.0–34.0)
MCHC: 33.1 g/dL (ref 30.0–36.0)
MCV: 94.5 fL (ref 80.0–100.0)
Monocytes Absolute: 0.6 10*3/uL (ref 0.1–1.0)
Monocytes Relative: 11 %
Neutro Abs: 3.6 10*3/uL (ref 1.7–7.7)
Neutrophils Relative %: 64 %
Platelet Count: 245 10*3/uL (ref 150–400)
RBC: 4.03 MIL/uL (ref 3.87–5.11)
RDW: 13 % (ref 11.5–15.5)
WBC Count: 5.6 10*3/uL (ref 4.0–10.5)
nRBC: 0 % (ref 0.0–0.2)

## 2023-02-03 LAB — CMP (CANCER CENTER ONLY)
ALT: 9 U/L (ref 0–44)
AST: 15 U/L (ref 15–41)
Albumin: 4.4 g/dL (ref 3.5–5.0)
Alkaline Phosphatase: 48 U/L (ref 38–126)
Anion gap: 9 (ref 5–15)
BUN: 14 mg/dL (ref 8–23)
CO2: 28 mmol/L (ref 22–32)
Calcium: 9.4 mg/dL (ref 8.9–10.3)
Chloride: 106 mmol/L (ref 98–111)
Creatinine: 0.78 mg/dL (ref 0.44–1.00)
GFR, Estimated: >60 mL/min (ref >60)
Glucose, Bld: 82 mg/dL (ref 70–99)
Potassium: 3.8 mmol/L (ref 3.5–5.1)
Sodium: 143 mmol/L (ref 135–145)
Total Bilirubin: 0.5 mg/dL (ref <1.2)
Total Protein: 6.7 g/dL (ref 6.5–8.1)

## 2023-02-03 LAB — URINALYSIS, ROUTINE W REFLEX MICROSCOPIC
Bilirubin Urine: NEGATIVE
Glucose, UA: NEGATIVE mg/dL
Hgb urine dipstick: NEGATIVE
Ketones, ur: NEGATIVE mg/dL
Leukocytes,Ua: NEGATIVE
Nitrite: NEGATIVE
Protein, ur: NEGATIVE mg/dL
Specific Gravity, Urine: 1.024 (ref 1.005–1.030)
pH: 5 (ref 5.0–8.0)

## 2023-02-03 LAB — POCT URINALYSIS DIPSTICK
Glucose, UA: NEGATIVE
Leukocytes, UA: NEGATIVE
Nitrite, UA: NEGATIVE
Protein, UA: NEGATIVE
Spec Grav, UA: >=1.03 — AB (ref 1.010–1.025)
Urobilinogen, UA: 0.2 U/dL
pH, UA: 5.5 (ref 5.0–8.0)

## 2023-02-03 LAB — D-DIMER, QUANTITATIVE: D-Dimer, Quant: <0.27 ug{FEU}/mL (ref 0.00–0.50)

## 2023-02-03 MED ORDER — GEMTESA 75 MG PO TABS
75.0000 mg | ORAL_TABLET | Freq: Every day | ORAL | Status: DC
Start: 2023-02-03 — End: 2023-05-06

## 2023-02-03 MED ORDER — ESTRADIOL 0.1 MG/GM VA CREA
0.5000 g | TOPICAL_CREAM | VAGINAL | 3 refills | Status: AC
Start: 2023-02-04 — End: ?
  Filled 2023-02-03: qty 42.5, 90d supply, fill #0
  Filled 2023-09-23: qty 42.5, 90d supply, fill #1

## 2023-02-03 MED ORDER — GEMTESA 75 MG PO TABS
75.0000 mg | ORAL_TABLET | Freq: Every day | ORAL | 2 refills | Status: DC
  Filled 2023-02-03: qty 30, 30d supply, fill #0
  Filled 2023-03-23: qty 30, 30d supply, fill #1
  Filled 2023-05-09: qty 30, 30d supply, fill #2

## 2023-02-03 NOTE — Assessment & Plan Note (Signed)
-   POCT UA +heme, bladder scan 23mL - We discussed the symptoms of overactive bladder (OAB), which include urinary urgency, urinary frequency, nocturia, with or without urge incontinence.  While we do not know the exact etiology of OAB, several treatment options exist. We discussed management including behavioral therapy (decreasing bladder irritants, urge suppression strategies, timed voids, bladder retraining), physical therapy, medication; for refractory cases posterior tibial nerve stimulation, sacral neuromodulation, and intravesical botulinum toxin injection.  For anticholinergic medications, we discussed the potential side effects of anticholinergics including dry eyes, dry mouth, constipation, cognitive impairment and urinary retention. For Beta-3 agonist medication, we discussed the potential side effect of elevated blood pressure which is more likely to occur in individuals with uncontrolled hypertension. - sample and Rx provided for [facility]

## 2023-02-03 NOTE — Assessment & Plan Note (Addendum)
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 Encouraged pt to consider tampon placement to assess source if she is concerned after vaginal estrogen and pelvic floor PT if refractory symptoms - prior pelvic floor PT with relief - Treatment options include anti-diarrhea medication (loperamide/ Imodium OTC or prescription lomotil), fiber supplements, physical therapy, and possible sacral neuromodulation or surgery.   - referral placed for pelvic floor PT, encouraged to resume Kegel exercises

## 2023-02-03 NOTE — Patient Instructions (Signed)
Accidental Bowel Leakage:  - Treatment options include anti-diarrhea medication (loperamide/ Imodium OTC or prescription lomotil), fiber supplements, physical therapy, and possible sacral neuromodulation or surgery.     Women should try to eat at least 21 to 25 grams of fiber a day, while men should aim for 30 to 38 grams a day. You can add fiber to your diet with food or a fiber supplement such as psyllium (metamucil), benefiber, or fibercon.   Here's a look at how much dietary fiber is found in some common foods. When buying packaged foods, check the Nutrition Facts label for fiber content. It can vary among brands.  Fruits Serving size Total fiber (grams)*  Raspberries 1 cup 8.0  Pear 1 medium 5.5  Apple, with skin 1 medium 4.5  Banana 1 medium 3.0  Orange 1 medium 3.0  Strawberries 1 cup 3.0   Vegetables Serving size Total fiber (grams)*  Green peas, boiled 1 cup 9.0  Broccoli, boiled 1 cup chopped 5.0  Turnip greens, boiled 1 cup 5.0  Brussels sprouts, boiled 1 cup 4.0  Potato, with skin, baked 1 medium 4.0  Sweet corn, boiled 1 cup 3.5  Cauliflower, raw 1 cup chopped 2.0  Carrot, raw 1 medium 1.5   Grains Serving size Total fiber (grams)*  Spaghetti, whole-wheat, cooked 1 cup 6.0  Barley, pearled, cooked 1 cup 6.0  Bran flakes 3/4 cup 5.5  Quinoa, cooked 1 cup 5.0  Oat bran muffin 1 medium 5.0  Oatmeal, instant, cooked 1 cup 5.0  Popcorn, air-popped 3 cups 3.5  Brown rice, cooked 1 cup 3.5  Bread, whole-wheat 1 slice 2.0  Bread, rye 1 slice 2.0   Legumes, nuts and seeds Serving size Total fiber (grams)*  Split peas, boiled 1 cup 16.0  Lentils, boiled 1 cup 15.5  Black beans, boiled 1 cup 15.0  Baked beans, canned 1 cup 10.0  Chia seeds 1 ounce 10.0  Almonds 1 ounce (23 nuts) 3.5  Pistachios 1 ounce (49 nuts) 3.0  Sunflower kernels 1 ounce 3.0  *Rounded to nearest 0.5 gram. Source: [employer] for [employer]	[place]    We  discussed the symptoms of overactive bladder (OAB), which include urinary urgency, urinary frequency, night-time urination, with or without urge incontinence.  We discussed management including behavioral therapy (decreasing bladder irritants by following a bladder diet, urge suppression strategies, timed voids, bladder retraining), physical therapy, medication; and for refractory cases posterior tibial nerve stimulation, sacral neuromodulation, and intravesical botulinum toxin injection.   For Beta-3 agonist medication, we discussed the potential side effect of elevated blood pressure which is more likely to occur in individuals with uncontrolled hypertension. You were given samples for Gemtesa 75 mg.  It can take a month to start working so give it time, but if you have bothersome side effects call sooner and we can try a different medication.  Call us if you have trouble filling the prescription or if it's not covered by your insurance.  For vaginal atrophy (thinning of the vaginal tissue that can cause dryness and burning) and UTI prevention we discussed estrogen replacement in the form of vaginal cream.   Start vaginal estrogen therapy nightly for two weeks then 2 times weekly at night. This can be placed with your finger or an applicator inside the vagina and around the urethra.  Please let us know if the prescription is too expensive and we can look for alternative options.   Is vaginal estrogen therapy safe for me?  Vaginal estrogen preparations act on the vaginal skin, and only a very tiny amount is absorbed into the bloodstream (0.01%).  They work in a similar way to hand or face cream.  There is minimal absorption and they are therefore perfectly safe. If you have had breast cancer and have persistent troublesome symptoms which aren't settling with vaginal moisturisers and lubricants, local estrogen treatment may be a possibility, but consultation with your oncologist should take place first.

## 2023-02-03 NOTE — Assessment & Plan Note (Signed)
For symptomatic vaginal atrophy options include lubrication with a water-based lubricant, personal hygiene measures and barrier protection against wetness, and estrogen replacement in the form of vaginal cream, vaginal tablets, or a time-released vaginal ring.   - Rx to start low dose vaginal estrogen - explained that history of DVT and PE is not a contraindication for vaginal estrogen, currently on Eliquis

## 2023-02-03 NOTE — Assessment & Plan Note (Signed)
-   POCT + heme, pending UA micro and culture - denies UTI symptoms

## 2023-02-03 NOTE — Assessment & Plan Note (Addendum)
-   LAVH, anterior and posterior repair by Dr. [name] and Dr. Sherron Monday 11/24/16 - asymptomatic - For treatment of pelvic organ prolapse, we discussed options for management including expectant management, conservative management, and surgical management, such as Kegels, a pessary, pelvic floor physical therapy, and specific surgical procedures. - referral placed for pelvic floor PT

## 2023-02-03 NOTE — Progress Notes (Signed)
Hematology and Oncology Follow Up Visit  Kathryn Pineda [id number] 05/14/1955 67 y.o. 02/03/2023   Principle Diagnosis:  Recurrent thromboembolic disease-left leg/pulmonary embolism  Current Therapy:   Eliquis 5 mg p.o. twice daily     Interim History:  Kathryn Pineda is back for follow-up.  We last saw her back in October.  Since then, she been doing pretty well.  We did do a CT angiogram on her in November.  This did not show any residual pulmonary embolism.  Her last L4 was done on 09/25/2022.  This showed a chronic thrombus in the common femoral vein at the saphenofemoral junction.  She is doing well on the Eliquis.  She may have a little bit of blood per rectum..  She has had no chest wall pain.  There has been no cough.  She has had no nausea or vomiting.  She has had no rashes.  There has been no obvious change in bowel or bladder habits.  She is seeing a uro-gynecologist because of some urinary incontinence.  She has had a couple urinary tract infections over the past year or so..  She does wear compression stocking on the left leg.  She has had no fever.  The been no problems with COVID.  We did do a complete hypercoagulable study on her.  Everything turned out fine without any obvious abnormal hypercoagulable issues.  Overall, I would say that her performance status is probably ECOG 1.   Medications:  Current Outpatient Medications:    acetaminophen (TYLENOL) 500 MG tablet, Take 500 mg by mouth every 6 (six) hours as needed for mild pain or moderate pain., Disp: , Rfl:    apixaban (ELIQUIS) 5 MG TABS tablet, Take 1 tablet (5 mg total) by mouth 2 (two) times daily., Disp: 180 tablet, Rfl: 3   Cholecalciferol (VITAMIN D3) 125 MCG (5000 UT) CAPS, Take 5,000 Units by mouth daily., Disp: , Rfl:    Cyanocobalamin (B-12 PO), Take 1 tablet by mouth daily., Disp: , Rfl:    [START ON 02/04/2023] estradiol (ESTRACE) 0.1 MG/GM vaginal cream, Place 0.5 g vaginally 2 (two) times a week. Place  0.5g nightly for two weeks then twice a week after, Disp: 30 g, Rfl: 3   loratadine (CLARITIN) 5 MG chewable tablet, Chew 5 mg by mouth daily., Disp: , Rfl:    Multiple Vitamin (MULTIVITAMIN) tablet, Take 1 tablet by mouth daily., Disp: , Rfl:    Multiple Vitamins-Minerals (EMERGEN-C IMMUNE) PACK, Take 0.5 packets by mouth daily., Disp: , Rfl:    Vibegron (GEMTESA) 75 MG TABS, Take 1 tablet (75 mg total) by mouth daily. LOT: [id number] EXP: 06/2024, Disp: , Rfl:    Vibegron (GEMTESA) 75 MG TABS, Take 1 tablet (75 mg total) by mouth daily., Disp: 30 tablet, Rfl: 2   EPINEPHrine (EPIPEN 2-PAK) 0.3 mg/0.3 mL IJ SOAJ injection, Inject 0.3 mg into the muscle as needed for anaphylaxis. (Patient not taking: Reported on 02/03/2023), Disp: 2 each, Rfl: PRN  Allergies:  Allergies  Allergen Reactions   Penicillins Itching and Rash   Adhesive [Tape] Other (See Comments)    Band-Aid causes blisters   Paxlovid [Nirmatrelvir-Ritonavir] Other (See Comments)    Severe allergic reaction, throat closed up and caused blisters all around the mouth.     Past Medical History, Surgical history, Social history, and Family History were reviewed and updated.  Review of Systems: Review of Systems  Constitutional: Negative.   HENT:  Negative.    Eyes: Negative.  Respiratory: Negative.    Cardiovascular: Negative.   Gastrointestinal: Negative.   Endocrine: Negative.   Genitourinary: Negative.    Musculoskeletal: Negative.   Skin: Negative.   Neurological: Negative.   Hematological: Negative.   Psychiatric/Behavioral: Negative.      Physical Exam:  weight is 223 lb (101.2 kg). Her oral temperature is 98.1 F (36.7 C). Her blood pressure is 105/51 (abnormal) and her pulse is 76. Her respiration is 18 and oxygen saturation is 99%.   Wt Readings from Last 3 Encounters:  02/03/23 223 lb (101.2 kg)  02/03/23 223 lb (101.2 kg)  11/25/22 219 lb 6.4 oz (99.5 kg)    Physical Exam Vitals reviewed.  HENT:      Head: Normocephalic and atraumatic.  Eyes:     Pupils: Pupils are equal, round, and reactive to light.  Cardiovascular:     Rate and Rhythm: Normal rate and regular rhythm.     Heart sounds: Normal heart sounds.  Pulmonary:     Effort: Pulmonary effort is normal.     Breath sounds: Normal breath sounds.  Abdominal:     General: Bowel sounds are normal.     Palpations: Abdomen is soft.  Musculoskeletal:        General: No tenderness or deformity. Normal range of motion.     Cervical back: Normal range of motion.  Lymphadenopathy:     Cervical: No cervical adenopathy.  Skin:    General: Skin is warm and dry.     Findings: No erythema or rash.  Neurological:     Mental Status: She is alert and oriented to person, place, and time.  Psychiatric:        Behavior: Behavior normal.        Thought Content: Thought content normal.        Judgment: Judgment normal.     Lab Results  Component Value Date   WBC 5.6 02/03/2023   HGB 12.6 02/03/2023   HCT 38.1 02/03/2023   MCV 94.5 02/03/2023   PLT 245 02/03/2023     Chemistry      Component Value Date/Time   NA 143 02/03/2023 1521   K 3.8 02/03/2023 1521   CL 106 02/03/2023 1521   CO2 28 02/03/2023 1521   BUN 14 02/03/2023 1521   CREATININE 0.78 02/03/2023 1521   CREATININE 0.77 03/06/2016 1203      Component Value Date/Time   CALCIUM 9.4 02/03/2023 1521   ALKPHOS 48 02/03/2023 1521   AST 15 02/03/2023 1521   ALT 9 02/03/2023 1521   BILITOT 0.5 02/03/2023 1521      Impression and Plan: Kathryn Pineda is a very nice 66 year old white female.  She is a retired Engineer, civil (consulting).  I really applaud her for all that she did.  She was at [facility].  I hate that she had this thromboembolism.  This is recurrent.  Had to believe that given the significance, that she will probably need lifelong anticoagulation.  I am glad that there is no obvious pulmonary embolism.  I think she will always have this thrombus in the left leg.  At some  point, we will see about decreasing her dose down to a maintenance dose.  We will try to get her through the Winter.  I will see her back in about 4 months.   Josph Macho, MD 12/18/20244:19 PM

## 2023-02-04 ENCOUNTER — Other Ambulatory Visit (HOSPITAL_BASED_OUTPATIENT_CLINIC_OR_DEPARTMENT_OTHER): Payer: Self-pay

## 2023-02-04 DIAGNOSIS — H5310 Unspecified subjective visual disturbances: Secondary | ICD-10-CM | POA: Diagnosis not present

## 2023-02-04 DIAGNOSIS — H2513 Age-related nuclear cataract, bilateral: Secondary | ICD-10-CM | POA: Diagnosis not present

## 2023-02-04 DIAGNOSIS — H43813 Vitreous degeneration, bilateral: Secondary | ICD-10-CM | POA: Diagnosis not present

## 2023-02-15 ENCOUNTER — Other Ambulatory Visit: Payer: Self-pay | Admitting: Obstetrics & Gynecology

## 2023-02-15 DIAGNOSIS — Z1231 Encounter for screening mammogram for malignant neoplasm of breast: Secondary | ICD-10-CM

## 2023-02-16 ENCOUNTER — Other Ambulatory Visit (HOSPITAL_BASED_OUTPATIENT_CLINIC_OR_DEPARTMENT_OTHER): Payer: Self-pay

## 2023-03-05 ENCOUNTER — Ambulatory Visit
Admission: RE | Admit: 2023-03-05 | Discharge: 2023-03-05 | Disposition: A | Discharge status: Home / Self Care | Payer: Medicare Other | Source: Ambulatory Visit | Attending: Obstetrics & Gynecology | Admitting: Obstetrics & Gynecology

## 2023-03-05 DIAGNOSIS — Z1231 Encounter for screening mammogram for malignant neoplasm of breast: Secondary | ICD-10-CM | POA: Diagnosis not present

## 2023-03-08 DIAGNOSIS — K080 Exfoliation of teeth due to systemic causes: Secondary | ICD-10-CM | POA: Diagnosis not present

## 2023-03-23 ENCOUNTER — Other Ambulatory Visit (HOSPITAL_BASED_OUTPATIENT_CLINIC_OR_DEPARTMENT_OTHER): Payer: Self-pay

## 2023-03-25 ENCOUNTER — Encounter: Payer: Medicare Other | Admitting: Physical Therapy

## 2023-03-31 NOTE — Therapy (Unsigned)
 OUTPATIENT PHYSICAL THERAPY FEMALE PELVIC EVALUATION   Patient Name: Kathryn Pineda MRN: [id number] DOB:October 27, 1955, 68 y.o., female Today's Date: 04/01/2023  END OF SESSION:  PT End of Session - 04/01/23 1033     Visit Number 1    Date for PT Re-Evaluation 06/24/23    Authorization Type BC[employer] medicare    Authorization - Visit Number 1    Authorization - Number of Visits 10    PT Start Time 1030    PT Stop Time 1115    PT Time Calculation (min) 45 min    Activity Tolerance Patient tolerated treatment well    Behavior During Therapy WFL for tasks assessed/performed             Past Medical History:  Diagnosis Date   Anal fissure    Arthritis    Biliary colic 5/96   ERCP w/spincterotomy   Condyloma    Deep vein thrombosis (DVT) of lower extremity (HCC)    Fatigue    loss of sleep   Generalized headaches    GERD (gastroesophageal reflux disease)    Hemorrhoid    Hiatal hernia    Migraines    Obesity    Phlebitis of leg    PONV (postoperative nausea and vomiting)    Primary osteoarthritis of both knees    Stress incontinence    Wilson's disease    Past Surgical History:  Procedure Laterality Date   ABDOMINAL HYSTERECTOMY     ANAL FISSURECTOMY  1986   ANTERIOR AND POSTERIOR REPAIR N/A 11/24/2016   Procedure: ANTERIOR (CYSTOCELE) AND POSTERIOR REPAIR (RECTOCELE);  Surgeon: Alfredo Martinez, MD;  Location: [facility];  Service: Urology;  Laterality: N/A;   BUNIONECTOMY Bilateral 2005   CESAREAN SECTION  1990   prolapsed cord   CYSTOSCOPY N/A 11/24/2016   Procedure: CYSTOSCOPY;  Surgeon: Alfredo Martinez, MD;  Location: [facility];  Service: Urology;  Laterality: N/A;   ENDOVENOUS ABLATION SAPHENOUS VEIN W/ LASER  2008   both lower legs   LAPAROSCOPIC CHOLECYSTECTOMY  1997   LAPAROSCOPIC GASTRIC SLEEVE RESECTION WITH HIATAL HERNIA REPAIR  09/16/2010   LAPAROSCOPIC VAGINAL HYSTERECTOMY WITH SALPINGECTOMY Bilateral 11/24/2016   Procedure: LAPAROSCOPIC ASSISTED VAGINAL  HYSTERECTOMY WITH SALPINGO-OOPHORECTOMY;  Surgeon: [name], MD;  Location: [facility];  Service: Gynecology;  Laterality: Bilateral;   LOWER EXTREMITY VENOGRAPHY  08/19/2022   Procedure: LOWER EXTREMITY VENOGRAPHY;  Surgeon: Victorino Sparrow, MD;  Location: [facility] INVASIVE CV LAB;  Service: Cardiovascular;;   PERIPHERAL VASCULAR THROMBECTOMY N/A 08/19/2022   Procedure: PERIPHERAL VASCULAR THROMBECTOMY;  Surgeon: Victorino Sparrow, MD;  Location: [facility] INVASIVE CV LAB;  Service: Cardiovascular;  Laterality: N/A;   PERIPHERAL VASCULAR ULTRASOUND/IVUS  08/19/2022   Procedure: Peripheral Vascular Ultrasound/IVUS;  Surgeon: Victorino Sparrow, MD;  Location: [facility] INVASIVE CV LAB;  Service: Cardiovascular;;   Patient Active Problem List   Diagnosis Date Noted   Urge urinary incontinence 02/03/2023   Abnormal urinalysis 02/03/2023   Vaginal atrophy 02/03/2023   Fecal incontinence 02/03/2023   Pulmonary emboli (HCC) 08/16/2022   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   Acute deep vein thrombosis (DVT) of femoral vein of left lower extremity (HCC) 08/16/2022   Internal hemorrhoids 07/24/2022   Thrombophlebitis of superficial veins of lower extremity 07/24/2022   History of DVT (deep vein thrombosis) 07/24/2022   Viral URI 04/10/2022   History of colonic polyps 07/15/2021   Rectocele 11/13/2016   Incomplete uterine prolapse 11/13/2016   Pelvic organ prolapse quantification  stage 2 cystocele 11/13/2016   Seasonal allergies 09/17/2014   Obesity (BMI 30-39.9) 12/24/2010   Hx of laparoscopic gastric banding 09/23/2010    PCP: Copland, [name], MD  REFERRING PROVIDER: Loleta Chance, MD   REFERRING DIAG:  N81.10 (ICD-10-CM) - Pelvic organ prolapse quantification stage 2 cystocele  N39.41 (ICD-10-CM) - Urge urinary incontinence  R15.9 (ICD-10-CM) - Incontinence of feces, unspecified fecal incontinence type    THERAPY DIAG:  Muscle weakness (generalized) - Plan: PT plan of care  cert/re-cert  Other lack of coordination - Plan: PT plan of care cert/re-cert  Pelvic pain - Plan: PT plan of care cert/re-cert  Rationale for Evaluation and Treatment: Rehabilitation  ONSET DATE: 2021  SUBJECTIVE:                                                                                                                                                                                           SUBJECTIVE STATEMENT: Patient reports she leaks liquid that she feels it is coming from the rectum. 07/27/23 had blood clots in the left legs, abdominal area and lungs. Had surgery to take the blood clots out. She can feel herself contracting the vaginal but does not feel herself contract the anus.  Fluid intake: water, coffee  PAIN:  Are you having pain? Yes NPRS scale: 3/10 Pain location:  vaginal area  Pain type: deep pressure cramp Pain description: intermittent   Aggravating factors: cough Relieving factors: not coughing  PRECAUTIONS: None  RED FLAGS: None   WEIGHT BEARING RESTRICTIONS: No  FALLS:  Has patient fallen in last 6 months? No  OCCUPATION: congregational nurse  ACTIVITY LEVEL : teach  and hour of chair yoga and wt. Lifting, walking  PLOF: Independent  PATIENT GOALS: improve pelvic floor strength,   PERTINENT HISTORY:  Abdominal Hysterectomy; Anal fissurectomy; anterior and posterior repair 20 18; cesarean section; Cholecystectomy; laparoscopic gastric sleeve resection with hiatal hernia repair;   BOWEL MOVEMENT: Pain with bowel movement: No Type of bowel movement:Type (Bristol Stool Scale) Type 6 and 7, Frequency daily and several times per day, Strain Yes, and Splinting no rarely strains Fully empty rectum: Yes: sometimes but may have to come back to the toilet several times Leakage: Yes: had stool leakage in the past 2 weeks with this virus, stool leakage with coughing Pads: Yes: 1 main pad with liner, changes the liner 1 time Fiber supplement/laative  Yes: metamucil mixed with benefiber  URINATION: Pain with urination: No Fully empty bladder: Yes: 80% of the time, after bowel movement may have to urinate Stream: Strong Urgency: Yes: a little bit, if she bends and move too much  Frequency: average Leakage:  has the urge to void then bends over Pads: Yes: see above  INTERCOURSE:  Ability to have vaginal penetration Yes.   Pain with intercourse: Initial Penetration and During Penetration, felt very dry DrynessYesusing estrogen cream Climax: yes Marinoff Scale: 1/3  PREGNANCY: Vaginal deliveries 3 Tearing Yes: possible grade 3 Episiotomy No C-section deliveries 1 Currently pregnant No   OBJECTIVE:  Note: Objective measures were completed at Evaluation unless otherwise noted.  DIAGNOSTIC FINDINGS:  none    COGNITION: Overall cognitive status: Within functional limits for tasks assessed     SENSATION: Light touch: Appears intact   POSTURE: decreased lumbar lordosis and posterior pelvic tilt   LUMBARAROM/PROM:  A/PROM A/PROM  eval  Flexion Full with tightness in lumbar  Extension Decreased by 25%  Right lateral flexion Decreased by 25%  Left lateral flexion Decreased by 25%  Right rotation Decreased by 25%  Left rotation Decreased by 25%   (Blank rows = not tested)  LOWER EXTREMITY ROM: bilateral hip ROM is full  Passive ROM Right eval Left eval  Hip flexion 4/5 4/5  Hip abduction 4/5 4/5   (Blank rows = not tested)  LOWER EXTREMITY MMT:  MMT Right eval Left eval  Hip flexion 4/5 4/5  Hip abduction 4/5 4/5   (Blank rows = not tested) PALPATION:   Pelvic Alignment: ASIS are equal  Abdominal: tenderness located in the lower abdomen, along the left upper abdominal, decreased lower rib cage mobility; scar in lower abdominal area is restricted; can tighten the abdominals well                External Perineal Exam: tenderness located on left ischiocavernosus and perineal body                              Internal Pelvic Floor: tenderness located on the iliococcygeus, rectum has hemorrhoids and looks aggravated with a cauliflower look posteriorly  Patient confirms identification and approves PT to assess internal pelvic floor and treatment Yes  PELVIC MMT:   MMT eval  Vaginal 2/5 no hug of finger  Internal Anal Sphincter 0/5  External Anal Sphincter 0/5  Puborectalis 0/5  (Blank rows = not tested)        TONE: increased  PROLAPSE: Anterior wall  TODAY'S TREATMENT:                                                                                                                              DATE: 04/01/23  EVAL See below   PATIENT EDUCATION:  04/01/23 Education details: educated patient on the [place] to reduce the depth of the penis in the vagina, cream for rectum to reduce the inflammation of the hemorrhoids and the kiwi to use for tissue manipulation around the vaginal area and pelvic floor muscles Person educated: Patient Education method: Explanation, Demonstration, Tactile cues, Verbal cues, and Handouts Education comprehension: verbalized  understanding, returned demonstration, verbal cues required, tactile cues required, and needs further education  HOME EXERCISE PROGRAM: See above.   ASSESSMENT:  CLINICAL IMPRESSION: Patient is a 68 y.o. female who was seen today for physical therapy evaluation and treatment for cystocele, fecal incontinence, and urge incontinence. She has a pelvic pressure pain at level 3/10 when coughing. She has pain with penile penetration due to the dryness of the tissue.  Patient reports stool leakage with coughing and happened 2 weeks ago. She does not feel like she fully empty her rectum due to having to go to the bathroom several times that day. She wears a pad and changes the liner 2 times for the stool leakage. She will leak urine when she has the urge to void and bends over.  Pelvic floor strength is 2/5, puborectalis, and anal sphincter muscles  are 0/5. The rectum has large hemorrhoids that are red and irritated looking like cauliflower. Patient has tenderness located in the lower abdomen, along the iliococcygeus,  left ischiocavernosus and perineal body. Patient will benefit from skilled therapy to improve pelvic floor strength and coordination to reduce leakage and pain.   OBJECTIVE IMPAIRMENTS: decreased coordination, decreased strength, increased fascial restrictions, increased muscle spasms, and pain.   ACTIVITY LIMITATIONS: bending, continence, and toileting  PARTICIPATION LIMITATIONS: interpersonal relationship and community activity  PERSONAL FACTORS: Age, Fitness, and 3+ comorbidities: Abdominal Hysterectomy; Anal fissurectomy; anterior and posterior repair 20 18; cesarean section; Cholecystectomy; laparoscopic gastric sleeve resection with hiatal hernia repair;   are also affecting patient's functional outcome.   REHAB POTENTIAL: Excellent  CLINICAL DECISION MAKING: Evolving/moderate complexity  EVALUATION COMPLEXITY: Moderate   GOALS: Goals reviewed with patient? Yes  SHORT TERM GOALS: Target date: 05/30/23  Patient independent with initial HEP for flexibility exercises.  Baseline: Goal status: INITIAL  2.  Patient educated on scar massage.  Baseline:  Goal status: INITIAL  3.  Patient educated on devices to assist with pelvic pain to relax the tissue.  Baseline:  Goal status: INITIAL  4.  Patient educated on daily vaginal moisturizers to improve the health of the tissue.  Baseline:  Goal status: INITIAL   LONG TERM GOALS: Target date: 06/24/23  Patient is independent with advanced HEP for pelvic floor and hip strength.  Baseline:  Goal status: INITIAL  2.  Patient is able to perform diaphragmatic breathing to bulge the pelvic floor to be able to push stool out without straining.  Baseline:  Goal status: INITIAL  3.  Patient pelvic floor strength is >/= 3/5 with hug of therapist finger to reduce her  urinary leakage >/= 75%.  Baseline:  Goal status: INITIAL  4.  Patient rectal strength is >/= 3/5 to reduce her stool leakage >/= 75%.  Baseline:  Goal status: INITIAL  5.  Patient reports her pain with coughing has decreased due to the ability to contract the pelvic floor.  Baseline:  Goal status: INITIAL  6.  Patient is able to have penile penetration vaginally with reduction of pain due to improving the health of her tissue and reduction of trigger points in the pelvic floor.  Baseline:  Goal status: INITIAL  PLAN:  PT FREQUENCY: 1-2x/week  PT DURATION: 12 weeks  PLANNED INTERVENTIONS: 97110-Therapeutic exercises, 97530- Therapeutic activity, 97112- Neuromuscular re-education, 97535- Self Care, 40981- Manual therapy, 97014- Electrical stimulation (unattended), 97035- Ultrasound, Patient/Family education, Taping, Dry Needling, Joint mobilization, Spinal mobilization, Scar mobilization, Cryotherapy, Moist heat, and Biofeedback  PLAN FOR NEXT SESSION: manual work to the abdomen, work  on scar, diaphragmatic breathing, back and hip stretches   Eulis Foster, PT 04/01/23 2:02 PM

## 2023-04-01 ENCOUNTER — Encounter: Payer: Medicare Other | Attending: Obstetrics | Admitting: Physical Therapy

## 2023-04-01 ENCOUNTER — Other Ambulatory Visit: Payer: Self-pay

## 2023-04-01 ENCOUNTER — Encounter: Payer: Self-pay | Admitting: Physical Therapy

## 2023-04-01 DIAGNOSIS — M6281 Muscle weakness (generalized): Secondary | ICD-10-CM | POA: Diagnosis not present

## 2023-04-01 DIAGNOSIS — R159 Full incontinence of feces: Secondary | ICD-10-CM | POA: Insufficient documentation

## 2023-04-01 DIAGNOSIS — N3941 Urge incontinence: Secondary | ICD-10-CM | POA: Insufficient documentation

## 2023-04-01 DIAGNOSIS — N8110 Cystocele, unspecified: Secondary | ICD-10-CM | POA: Insufficient documentation

## 2023-04-01 DIAGNOSIS — R102 Pelvic and perineal pain: Secondary | ICD-10-CM | POA: Insufficient documentation

## 2023-04-01 DIAGNOSIS — R278 Other lack of coordination: Secondary | ICD-10-CM | POA: Diagnosis not present

## 2023-04-02 ENCOUNTER — Ambulatory Visit (HOSPITAL_BASED_OUTPATIENT_CLINIC_OR_DEPARTMENT_OTHER)
Admission: RE | Admit: 2023-04-02 | Discharge: 2023-04-02 | Disposition: A | Discharge status: Home / Self Care | Payer: Medicare Other | Source: Ambulatory Visit | Attending: Family | Admitting: Family

## 2023-04-02 ENCOUNTER — Ambulatory Visit: Payer: Self-pay | Admitting: Family Medicine

## 2023-04-02 ENCOUNTER — Other Ambulatory Visit (HOSPITAL_BASED_OUTPATIENT_CLINIC_OR_DEPARTMENT_OTHER): Payer: Self-pay

## 2023-04-02 ENCOUNTER — Ambulatory Visit (INDEPENDENT_AMBULATORY_CARE_PROVIDER_SITE_OTHER): Payer: Medicare Other | Admitting: Family

## 2023-04-02 ENCOUNTER — Telehealth: Payer: Self-pay | Admitting: Family

## 2023-04-02 VITALS — BP 121/50 | HR 65 | Temp 98.5°F | Resp 16 | Ht 67.2 in | Wt 227.0 lb

## 2023-04-02 DIAGNOSIS — R062 Wheezing: Secondary | ICD-10-CM | POA: Diagnosis not present

## 2023-04-02 DIAGNOSIS — J9811 Atelectasis: Secondary | ICD-10-CM | POA: Diagnosis not present

## 2023-04-02 DIAGNOSIS — R058 Other specified cough: Secondary | ICD-10-CM | POA: Diagnosis not present

## 2023-04-02 DIAGNOSIS — R051 Acute cough: Secondary | ICD-10-CM | POA: Insufficient documentation

## 2023-04-02 DIAGNOSIS — J209 Acute bronchitis, unspecified: Secondary | ICD-10-CM

## 2023-04-02 MED ORDER — AZITHROMYCIN 250 MG PO TABS
ORAL_TABLET | ORAL | 0 refills | Status: AC
Start: 2023-04-02 — End: 2023-04-07

## 2023-04-02 MED ORDER — PREDNISONE 10 MG PO TABS
ORAL_TABLET | ORAL | 0 refills | Status: DC
  Filled 2023-04-02: qty 20, 8d supply, fill #0

## 2023-04-02 MED ORDER — ALBUTEROL SULFATE HFA 108 (90 BASE) MCG/ACT IN AERS
2.0000 | INHALATION_SPRAY | Freq: Four times a day (QID) | RESPIRATORY_TRACT | 0 refills | Status: DC | PRN
  Filled 2023-04-02: qty 6.7, 25d supply, fill #0
  Filled 2024-02-16: qty 6.7, 25d supply, fill #1

## 2023-04-02 NOTE — Telephone Encounter (Signed)
Advised pt re: negative cxr for pneumonia. Will send rx for zpak to cover for bronchitis given duration of symptoms.  Rx sent to [facility] at pt request. Notes albuterol is already helping.

## 2023-04-02 NOTE — Progress Notes (Signed)
 Subjective:     Patient ID: Kathryn Pineda, female    DOB: 01-14-1956, 68 y.o.   MRN: [id number]  Chief Complaint  Patient presents with   Cough    Patient complains of productive cough for 11 days   Nasal Congestion    Complains of congestion   Sore Throat    Complains of sore throat for about 11 dayus    Cough  Sore Throat  Associated symptoms include coughing.    Discussed the use of AI scribe software for clinical note transcription with the patient, who gave verbal consent to proceed.  History of Present Illness   Kathryn Pineda is a 68 year old female with a history of pulmonary embolisms who presents with respiratory symptoms.  She has been experiencing respiratory symptoms for the past eleven days, including a productive cough, nasal congestion, and a sore throat, which is particularly noticeable when coughing. No difficulty eating or drinking. She manages her symptoms with over-the-counter medications, including Claritin and Mucinex, taking Mucinex early in the day to avoid sleep disturbances. Her cough produces thick, dark phlegm, and she occasionally feels warmer with slight sweating, attributed to changes in room temperature.  At the onset of her symptoms, she experienced a fever lasting approximately three to four days, with a maximum temperature increase of about two degrees. She also reports feeling fatigued.  She mentions coughing up a small spot of blood in the morning, which she attributes to hard coughing and her use of Eliquis. Her sputum is described as foamy. A COVID test performed four days into her symptoms was negative.  She is part of a small choir where several members, including one with influenza A, fell ill around the same time.  Her past medical history is significant for three pulmonary embolisms in June. This is her first respiratory illness since then. She is allergic to penicillin.          Health Maintenance Due  Topic Date Due   COVID-19  Vaccine (6 - 2024-25 season) 10/18/2022    Past Medical History:  Diagnosis Date   Anal fissure    Arthritis    Biliary colic 5/96   ERCP w/spincterotomy   Condyloma    Deep vein thrombosis (DVT) of lower extremity (HCC)    Fatigue    loss of sleep   Generalized headaches    GERD (gastroesophageal reflux disease)    Hemorrhoid    Hiatal hernia    Migraines    Obesity    Phlebitis of leg    PONV (postoperative nausea and vomiting)    Primary osteoarthritis of both knees    Stress incontinence    Wilson's disease     Past Surgical History:  Procedure Laterality Date   ABDOMINAL HYSTERECTOMY     ANAL FISSURECTOMY  1986   ANTERIOR AND POSTERIOR REPAIR N/A 11/24/2016   Procedure: ANTERIOR (CYSTOCELE) AND POSTERIOR REPAIR (RECTOCELE);  Surgeon: Alfredo Martinez, MD;  Location: [facility];  Service: Urology;  Laterality: N/A;   BUNIONECTOMY Bilateral 2005   CESAREAN SECTION  1990   prolapsed cord   CYSTOSCOPY N/A 11/24/2016   Procedure: CYSTOSCOPY;  Surgeon: Alfredo Martinez, MD;  Location: [facility];  Service: Urology;  Laterality: N/A;   ENDOVENOUS ABLATION SAPHENOUS VEIN W/ LASER  2008   both lower legs   LAPAROSCOPIC CHOLECYSTECTOMY  1997   LAPAROSCOPIC GASTRIC SLEEVE RESECTION WITH HIATAL HERNIA REPAIR  09/16/2010   LAPAROSCOPIC VAGINAL HYSTERECTOMY WITH SALPINGECTOMY Bilateral 11/24/2016  Procedure: LAPAROSCOPIC ASSISTED VAGINAL HYSTERECTOMY WITH SALPINGO-OOPHORECTOMY;  Surgeon: [name], MD;  Location: [facility];  Service: Gynecology;  Laterality: Bilateral;   LOWER EXTREMITY VENOGRAPHY  08/19/2022   Procedure: LOWER EXTREMITY VENOGRAPHY;  Surgeon: Victorino Sparrow, MD;  Location: [facility] INVASIVE CV LAB;  Service: Cardiovascular;;   PERIPHERAL VASCULAR THROMBECTOMY N/A 08/19/2022   Procedure: PERIPHERAL VASCULAR THROMBECTOMY;  Surgeon: Victorino Sparrow, MD;  Location: [facility] INVASIVE CV LAB;  Service: Cardiovascular;  Laterality: N/A;   PERIPHERAL VASCULAR ULTRASOUND/IVUS  08/19/2022    Procedure: Peripheral Vascular Ultrasound/IVUS;  Surgeon: Victorino Sparrow, MD;  Location: [facility] INVASIVE CV LAB;  Service: Cardiovascular;;    Family History  Problem Relation Age of Onset   Osteoarthritis Mother    Diabetes Father    Colon polyps Father    Hypertension Brother    Cancer Maternal Grandmother        unaware/ unknown primary source   Cancer Maternal Uncle        leukemia   Bladder Cancer Neg Hx    Uterine cancer Neg Hx     Social History   Socioeconomic History   Marital status: Married    Spouse name: Not on file   Number of children: Not on file   Years of education: Not on file   Highest education level: Not on file  Occupational History   Not on file  Tobacco Use   Smoking status: Never   Smokeless tobacco: Never  Vaping Use   Vaping status: Never Used  Substance and Sexual Activity   Alcohol use: Yes    Alcohol/week: 1.0 standard drink of alcohol    Types: 1 Glasses of wine per week   Drug use: No   Sexual activity: Yes    Partners: Male    Birth control/protection: Other-see comments, Surgical    Comment: LAVH 11/24/16  Other Topics Concern   Not on file  Social History Narrative   Not on file   Social Drivers of Health   Financial Resource Strain: Not on file  Food Insecurity: Not on file  Transportation Needs: Not on file  Physical Activity: Not on file  Stress: Not on file  Social Connections: Not on file  Intimate Partner Violence: Not on file    Outpatient Medications Prior to Visit  Medication Sig Dispense Refill   acetaminophen (TYLENOL) 500 MG tablet Take 500 mg by mouth every 6 (six) hours as needed for mild pain or moderate pain.     apixaban (ELIQUIS) 5 MG TABS tablet Take 1 tablet (5 mg total) by mouth 2 (two) times daily. 180 tablet 3   Cholecalciferol (VITAMIN D3) 125 MCG (5000 UT) CAPS Take 5,000 Units by mouth daily.     Cyanocobalamin (B-12 PO) Take 1 tablet by mouth daily.     EPINEPHrine (EPIPEN 2-PAK) 0.3 mg/0.3  mL IJ SOAJ injection Inject 0.3 mg into the muscle as needed for anaphylaxis. 2 each PRN   estradiol (ESTRACE) 0.1 MG/GM vaginal cream Place 0.5 g vaginally 2 (two) times a week. Place 0.5g nightly for two weeks then twice a week after 30 g 3   loratadine (CLARITIN) 5 MG chewable tablet Chew 5 mg by mouth daily.     Multiple Vitamin (MULTIVITAMIN) tablet Take 1 tablet by mouth daily.     Multiple Vitamins-Minerals (EMERGEN-C IMMUNE) PACK Take 0.5 packets by mouth daily.     Vibegron (GEMTESA) 75 MG TABS Take 1 tablet (75 mg total) by mouth daily. LOT: [id number]  EXP: 06/2024     Vibegron (GEMTESA) 75 MG TABS Take 1 tablet (75 mg total) by mouth daily. 30 tablet 2   No facility-administered medications prior to visit.    Allergies  Allergen Reactions   Penicillins Itching and Rash   Adhesive [Tape] Other (See Comments)    Band-Aid causes blisters   Paxlovid [Nirmatrelvir-Ritonavir] Other (See Comments)    Severe allergic reaction, throat closed up and caused blisters all around the mouth.     Review of Systems  Respiratory:  Positive for cough.        Objective:    Physical Exam Constitutional:      General: She is not in acute distress.    Appearance: Normal appearance. She is well-developed.  HENT:     Head: Normocephalic and atraumatic.     Right Ear: Tympanic membrane, ear canal and external ear normal.     Left Ear: Tympanic membrane, ear canal and external ear normal.  Eyes:     General: No scleral icterus. Neck:     Thyroid: No thyromegaly.  Cardiovascular:     Rate and Rhythm: Normal rate and regular rhythm.     Heart sounds: Normal heart sounds. No murmur heard. Pulmonary:     Effort: No respiratory distress.     Breath sounds: Wheezing and rales present.     Comments: + cough Musculoskeletal:     Cervical back: Neck supple.  Skin:    General: Skin is warm and dry.  Neurological:     Mental Status: She is alert and oriented to person, place, and time.   Psychiatric:        Mood and Affect: Mood normal.        Behavior: Behavior normal.        Thought Content: Thought content normal.        Judgment: Judgment normal.      BP (!) 121/50 (BP Location: Right Arm, Patient Position: Sitting, Cuff Size: Large)   Pulse 65   Temp 98.5 F (36.9 C) (Oral)   Resp 16   Ht 5' 7.2" (1.707 m)   Wt 227 lb (103 kg)   SpO2 100%   BMI 35.34 kg/m  Wt Readings from Last 3 Encounters:  04/02/23 227 lb (103 kg)  02/03/23 223 lb (101.2 kg)  02/03/23 223 lb (101.2 kg)       Assessment & Plan:   Problem List Items Addressed This Visit       Unprioritized   Bronchitis with bronchospasm - Primary   New.  CXR negative for PNA.  Given duration of bronchitis symptoms, will rx with zpak.  Prednisone, albuterol for bronchospasm/inflammation.  Pt to call if symptoms worsen or if symptoms fail to improve.      Relevant Orders   DG Chest 2 View (Completed)    I am having Warden Fillers start on predniSONE and albuterol. I am also having her maintain her Cyanocobalamin (B-12 PO), Vitamin D3, multivitamin, loratadine, EPINEPHrine, acetaminophen, Emergen-C Immune, apixaban, estradiol, Gemtesa, and Gemtesa.  Meds ordered this encounter  Medications   predniSONE (DELTASONE) 10 MG tablet    Sig: Take 4 tabs by mouth once daily for 2 days, then 3 tabs daily x 2 days, then 2 tabs daily x 2 days, then 1 tab daily x 2 days    Dispense:  20 tablet    Refill:  0    Supervising Provider:   Danise Edge A [[id number]]   albuterol (VENTOLIN HFA) 108 (  90 Base) MCG/ACT inhaler    Sig: Inhale 2 puffs into the lungs every 6 (six) hours as needed for wheezing or shortness of breath.    Dispense:  8 g    Refill:  0    Supervising Provider:   Danise Edge A [[id number]]

## 2023-04-02 NOTE — Telephone Encounter (Signed)
  Chief Complaint: cough, was exposed to flu a Symptoms: cough, yellow thick sputum, wheeze Frequency: for 3 weeks Pertinent Negatives: Patient denies fever, sob Disposition: [] ED /[] Urgent Care (no appt availability in office) / [x] Appointment(In office/virtual)/ []  [facility]/ [] Home Care/ [] Refused Recommended Disposition /[] [facility]/ []  Follow-up with PCP Additional Notes: states exposed to flu a and has had cough for three weeks.  Apt scheduled for this am, care advice given, denies questions, instructed to go to er if becomes worse.   Reason for Disposition  [1] MILD difficulty breathing (e.g., minimal/no SOB at rest, SOB with walking, pulse <100) AND [2] still present when not coughing  Answer Assessment - Initial Assessment Questions 1. ONSET: "When did the cough begin?"      11 days ago 2. SEVERITY: "How bad is the cough today?"      moderate 3. SPUTUM: "Describe the color of your sputum" (none, dry cough; clear, white, yellow, green)     Yellow, thick 4. HEMOPTYSIS: "Are you coughing up any blood?" If so ask: "How much?" (flecks, streaks, tablespoons, etc.)     Yes, one spot 5. DIFFICULTY BREATHING: "Are you having difficulty breathing?" If Yes, ask: "How bad is it?" (e.g., mild, moderate, severe)    - MILD: No SOB at rest, mild SOB with walking, speaks normally in sentences, can lie down, no retractions, pulse < 100.    - MODERATE: SOB at rest, SOB with minimal exertion and prefers to sit, cannot lie down flat, speaks in phrases, mild retractions, audible wheezing, pulse 100-120.    - SEVERE: Very SOB at rest, speaks in single words, struggling to breathe, sitting hunched forward, retractions, pulse > 120      denies 6. FEVER: "Do you have a fever?" If Yes, ask: "What is your temperature, how was it measured, and when did it start?"     no 7. CARDIAC HISTORY: "Do you have any history of heart disease?" (e.g., heart attack, congestive heart failure)       dvt 8. LUNG HISTORY: "Do you have any history of lung disease?"  (e.g., pulmonary embolus, asthma, emphysema)     PE 9. PE RISK FACTORS: "Do you have a history of blood clots?" (or: recent major surgery, recent prolonged travel, bedridden)     Yes, on blood thinners 10. OTHER SYMPTOMS: "Do you have any other symptoms?" (e.g., runny nose, wheezing, chest pain)       Runny nose, wheezing 11. PREGNANCY: "Is there any chance you are pregnant?" "When was your last menstrual period?"       na 12. TRAVEL: "Have you traveled out of the country in the last month?" (e.g., travel history, exposures)       Denies.  Protocols used: Cough - Acute Productive-A-AH

## 2023-04-03 NOTE — Assessment & Plan Note (Signed)
 New.  CXR negative for PNA.  Given duration of bronchitis symptoms, will rx with zpak.  Prednisone, albuterol for bronchospasm/inflammation.  Pt to call if symptoms worsen or if symptoms fail to improve.

## 2023-04-06 ENCOUNTER — Encounter: Payer: Self-pay | Admitting: Physical Therapy

## 2023-04-06 ENCOUNTER — Encounter: Payer: Medicare Other | Admitting: Physical Therapy

## 2023-04-06 DIAGNOSIS — N3941 Urge incontinence: Secondary | ICD-10-CM | POA: Diagnosis not present

## 2023-04-06 DIAGNOSIS — R102 Pelvic and perineal pain unspecified side: Secondary | ICD-10-CM

## 2023-04-06 DIAGNOSIS — M6281 Muscle weakness (generalized): Secondary | ICD-10-CM

## 2023-04-06 DIAGNOSIS — R278 Other lack of coordination: Secondary | ICD-10-CM

## 2023-04-06 DIAGNOSIS — R159 Full incontinence of feces: Secondary | ICD-10-CM | POA: Diagnosis not present

## 2023-04-06 DIAGNOSIS — N8110 Cystocele, unspecified: Secondary | ICD-10-CM | POA: Diagnosis not present

## 2023-04-06 NOTE — Therapy (Signed)
 OUTPATIENT PHYSICAL THERAPY FEMALE PELVIC TREATMENT  Patient Name: Kathryn Pineda MRN: [id number] DOB:15-Aug-1955, 68 y.o., female Today's Date: 04/06/2023  END OF SESSION:  PT End of Session - 04/06/23 1503     Visit Number 2    Date for PT Re-Evaluation 06/24/23    Authorization Type [employer] medicare    Authorization - Visit Number 2    Authorization - Number of Visits 10    PT Start Time 1500    PT Stop Time 1545    PT Time Calculation (min) 45 min    Activity Tolerance Patient tolerated treatment well    Behavior During Therapy WFL for tasks assessed/performed             Past Medical History:  Diagnosis Date   Anal fissure    Arthritis    Biliary colic 5/96   ERCP w/spincterotomy   Condyloma    Deep vein thrombosis (DVT) of lower extremity (HCC)    Fatigue    loss of sleep   Generalized headaches    GERD (gastroesophageal reflux disease)    Hemorrhoid    Hiatal hernia    Migraines    Obesity    Phlebitis of leg    PONV (postoperative nausea and vomiting)    Primary osteoarthritis of both knees    Stress incontinence    Wilson's disease    Past Surgical History:  Procedure Laterality Date   ABDOMINAL HYSTERECTOMY     ANAL FISSURECTOMY  1986   ANTERIOR AND POSTERIOR REPAIR N/A 11/24/2016   Procedure: ANTERIOR (CYSTOCELE) AND POSTERIOR REPAIR (RECTOCELE);  Surgeon: Alfredo Martinez, MD;  Location: [facility];  Service: Urology;  Laterality: N/A;   BUNIONECTOMY Bilateral 2005   CESAREAN SECTION  1990   prolapsed cord   CYSTOSCOPY N/A 11/24/2016   Procedure: CYSTOSCOPY;  Surgeon: Alfredo Martinez, MD;  Location: [facility];  Service: Urology;  Laterality: N/A;   ENDOVENOUS ABLATION SAPHENOUS VEIN W/ LASER  2008   both lower legs   LAPAROSCOPIC CHOLECYSTECTOMY  1997   LAPAROSCOPIC GASTRIC SLEEVE RESECTION WITH HIATAL HERNIA REPAIR  09/16/2010   LAPAROSCOPIC VAGINAL HYSTERECTOMY WITH SALPINGECTOMY Bilateral 11/24/2016   Procedure: LAPAROSCOPIC ASSISTED VAGINAL  HYSTERECTOMY WITH SALPINGO-OOPHORECTOMY;  Surgeon: [name], MD;  Location: [facility];  Service: Gynecology;  Laterality: Bilateral;   LOWER EXTREMITY VENOGRAPHY  08/19/2022   Procedure: LOWER EXTREMITY VENOGRAPHY;  Surgeon: Victorino Sparrow, MD;  Location: [facility] INVASIVE CV LAB;  Service: Cardiovascular;;   PERIPHERAL VASCULAR THROMBECTOMY N/A 08/19/2022   Procedure: PERIPHERAL VASCULAR THROMBECTOMY;  Surgeon: Victorino Sparrow, MD;  Location: [facility] INVASIVE CV LAB;  Service: Cardiovascular;  Laterality: N/A;   PERIPHERAL VASCULAR ULTRASOUND/IVUS  08/19/2022   Procedure: Peripheral Vascular Ultrasound/IVUS;  Surgeon: Victorino Sparrow, MD;  Location: [facility] INVASIVE CV LAB;  Service: Cardiovascular;;   Patient Active Problem List   Diagnosis Date Noted   Urge urinary incontinence 02/03/2023   Abnormal urinalysis 02/03/2023   Vaginal atrophy 02/03/2023   Fecal incontinence 02/03/2023   Pulmonary emboli (HCC) 08/16/2022   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   Acute deep vein thrombosis (DVT) of femoral vein of left lower extremity (HCC) 08/16/2022   Internal hemorrhoids 07/24/2022   Thrombophlebitis of superficial veins of lower extremity 07/24/2022   History of DVT (deep vein thrombosis) 07/24/2022   Viral URI 04/10/2022   History of colonic polyps 07/15/2021   Rectocele 11/13/2016   Incomplete uterine prolapse 11/13/2016   Pelvic organ prolapse quantification stage  2 cystocele 11/13/2016   Bronchitis with bronchospasm 09/17/2014   Seasonal allergies 09/17/2014   Obesity (BMI 30-39.9) 12/24/2010   Hx of laparoscopic gastric banding 09/23/2010    PCP: Copland, [name], MD  REFERRING PROVIDER: Loleta Chance, MD   REFERRING DIAG:  N81.10 (ICD-10-CM) - Pelvic organ prolapse quantification stage 2 cystocele  N39.41 (ICD-10-CM) - Urge urinary incontinence  R15.9 (ICD-10-CM) - Incontinence of feces, unspecified fecal incontinence type    THERAPY DIAG:  Muscle  weakness (generalized)  Other lack of coordination  Pelvic pain  Rationale for Evaluation and Treatment: Rehabilitation  ONSET DATE: 2021  SUBJECTIVE:                                                                                                                                                                                           SUBJECTIVE STATEMENT: I am having difficulty wit balancing my bowels. I got the Ohnut, massage device.   PAIN:  Are you having pain? Yes NPRS scale: 3/10 Pain location:  vaginal area  Pain type: deep pressure cramp Pain description: intermittent   Aggravating factors: cough Relieving factors: not coughing  PRECAUTIONS: None  RED FLAGS: None   WEIGHT BEARING RESTRICTIONS: No  FALLS:  Has patient fallen in last 6 months? No  OCCUPATION: congregational nurse  ACTIVITY LEVEL : teach  and hour of chair yoga and wt. Lifting, walking  PLOF: Independent  PATIENT GOALS: improve pelvic floor strength,   PERTINENT HISTORY:  Abdominal Hysterectomy; Anal fissurectomy; anterior and posterior repair 20 18; cesarean section; Cholecystectomy; laparoscopic gastric sleeve resection with hiatal hernia repair;   BOWEL MOVEMENT: Pain with bowel movement: No Type of bowel movement:Type (Bristol Stool Scale) Type 6 and 7, Frequency daily and several times per day, Strain Yes, and Splinting no rarely strains Fully empty rectum: Yes: sometimes but may have to come back to the toilet several times Leakage: Yes: had stool leakage in the past 2 weeks with this virus, stool leakage with coughing Pads: Yes: 1 main pad with liner, changes the liner 1 time Fiber supplement/laative Yes: metamucil mixed with benefiber  URINATION: Pain with urination: No Fully empty bladder: Yes: 80% of the time, after bowel movement may have to urinate Stream: Strong Urgency: Yes: a little bit, if she bends and move too much Frequency: average Leakage:  has the urge to void  then bends over Pads: Yes: see above  INTERCOURSE:  Ability to have vaginal penetration Yes.   Pain with intercourse: Initial Penetration and During Penetration, felt very dry DrynessYesusing estrogen cream Climax: yes Marinoff Scale: 1/3  PREGNANCY: Vaginal deliveries 3 Tearing Yes: possible grade  3 Episiotomy No C-section deliveries 1 Currently pregnant No   OBJECTIVE:  Note: Objective measures were completed at Evaluation unless otherwise noted.  DIAGNOSTIC FINDINGS:  none    COGNITION: Overall cognitive status: Within functional limits for tasks assessed     SENSATION: Light touch: Appears intact   POSTURE: decreased lumbar lordosis and posterior pelvic tilt   LUMBARAROM/PROM:  A/PROM A/PROM  eval  Flexion Full with tightness in lumbar  Extension Decreased by 25%  Right lateral flexion Decreased by 25%  Left lateral flexion Decreased by 25%  Right rotation Decreased by 25%  Left rotation Decreased by 25%   (Blank rows = not tested)  LOWER EXTREMITY ROM: bilateral hip ROM is full  Passive ROM Right eval Left eval  Hip flexion 4/5 4/5  Hip abduction 4/5 4/5   (Blank rows = not tested)  LOWER EXTREMITY MMT:  MMT Right eval Left eval  Hip flexion 4/5 4/5  Hip abduction 4/5 4/5   (Blank rows = not tested) PALPATION:   Pelvic Alignment: ASIS are equal  Abdominal: tenderness located in the lower abdomen, along the left upper abdominal, decreased lower rib cage mobility; scar in lower abdominal area is restricted; can tighten the abdominals well                External Perineal Exam: tenderness located on left ischiocavernosus and perineal body                             Internal Pelvic Floor: tenderness located on the iliococcygeus, rectum has hemorrhoids and looks aggravated with a cauliflower look posteriorly  Patient confirms identification and approves PT to assess internal pelvic floor and treatment Yes  PELVIC MMT:   MMT eval   Vaginal 2/5 no hug of finger  Internal Anal Sphincter 0/5  External Anal Sphincter 0/5  Puborectalis 0/5  (Blank rows = not tested)        TONE: increased  PROLAPSE: Anterior wall  TODAY'S TREATMENT:      04/06/23 Manual: Soft tissue mobilization: Manual work to the diaphragm to improve breath Manual work to the abdominals to loosen up the tissue Scar tissue mobilization: Scar work to the lower abdominal scar moving through the different planes of fascia and pulling up on the scar Myofascial release: Fascial release around the abdomen to move the skin over the muscle and improve tissue mobility Tissue rolling of the lower rib cage Neuromuscular re-education: Down training: Diaphragmatic breathing with tactile cues to expand the ribs and have the air go into the abdomen to the pelvic floor Exercises: Stretches/mobility: Piriformis stretch supine holding 30 sec bil. Pulling leg across trunk holding 30 sec bil.  Cat cow 15 times Quadruped rock with hips internally rotated and keeping spinal neutral to stretch the pelvic floor Therapeutic activities: Functional strengthening activities: Patient has showed therapist her exercise routine with engagement of the core, keeping the rib cage over the pelvis and breath out with hardest part  PATIENT EDUCATION:  04/07/23 Education details: educated patient on the Ohnut to reduce the depth of the penis in the vagina, cream for rectum to reduce the inflammation of the hemorrhoids and the kiwi to use for tissue manipulation around the vaginal area and pelvic floor musclesAccess Code: [email or web address] Person educated: Patient Education method: Explanation, Demonstration, Tactile cues, Verbal cues, and Handouts Education comprehension: verbalized understanding, returned demonstration, verbal cues required, tactile cues required,  and needs further education  HOME EXERCISE PROGRAM: 04/06/23 Access Code: [email or web address] URL: [email or web address] Date: 04/06/2023 Prepared by: Eulis Foster  Exercises - Supine Figure 4 Piriformis Stretch  - 1 x daily - 7 x weekly - 1 sets - 1 reps - 30 sec hold - Supine Diaphragmatic Breathing  - 1 x daily - 7 x weekly - 1 sets - 10 reps - Cat Cow  - 1 x daily - 7 x weekly - 1 sets - 10 reps - Quadruped Rocking Slow  - 1 x daily - 7 x weekly - 1 sets - 10 reps  ASSESSMENT:  CLINICAL IMPRESSION: Patient is a 67 y.o. female who was seen today for physical therapy  treatment for cystocele, fecal incontinence, and urge incontinence. She is able to do the diaphragmatic breathing but feels the air stop above the pubic bone, She had increased abdominal tissue mobility and less swelling. She has tight posterior hips that restricts the pelvic floor. She is using the estrogen cream on the vaginal canal to work on the health of the tissue and it is making the area more comfortable.  Patient will benefit from skilled therapy to improve pelvic floor strength and coordination to reduce leakage and pain.   OBJECTIVE IMPAIRMENTS: decreased coordination, decreased strength, increased fascial restrictions, increased muscle spasms, and pain.   ACTIVITY LIMITATIONS: bending, continence, and toileting  PARTICIPATION LIMITATIONS: interpersonal relationship and community activity  PERSONAL FACTORS: Age, Fitness, and 3+ comorbidities: Abdominal Hysterectomy; Anal fissurectomy; anterior and posterior repair 20 18; cesarean section; Cholecystectomy; laparoscopic gastric sleeve resection with hiatal hernia repair;   are also affecting patient's functional outcome.   REHAB POTENTIAL: Excellent  CLINICAL DECISION MAKING: Evolving/moderate complexity  EVALUATION COMPLEXITY: Moderate   GOALS: Goals reviewed with patient? Yes  SHORT TERM GOALS: Target date: 05/30/23  Patient independent with initial  HEP for flexibility exercises.  Baseline: Goal status: INITIAL  2.  Patient educated on scar massage.  Baseline:  Goal status: INITIAL  3.  Patient educated on devices to assist with pelvic pain to relax the tissue.  Baseline:  Goal status: INITIAL  4.  Patient educated on daily vaginal moisturizers to improve the health of the tissue.  Baseline:  Goal status: INITIAL   LONG TERM GOALS: Target date: 06/24/23  Patient is independent with advanced HEP for pelvic floor and hip strength.  Baseline:  Goal status: INITIAL  2.  Patient is able to perform diaphragmatic breathing to bulge the pelvic floor to be able to push stool out without straining.  Baseline:  Goal status: INITIAL  3.  Patient pelvic floor strength is >/= 3/5 with hug of therapist finger to reduce her urinary leakage >/= 75%.  Baseline:  Goal status: INITIAL  4.  Patient rectal strength is >/= 3/5 to reduce her stool leakage >/= 75%.  Baseline:  Goal status: INITIAL  5.  Patient reports her pain with coughing has decreased due to the ability to contract the pelvic floor.  Baseline:  Goal status: INITIAL  6.  Patient is able to have penile  penetration vaginally with reduction of pain due to improving the health of her tissue and reduction of trigger points in the pelvic floor.  Baseline:  Goal status: INITIAL  PLAN:  PT FREQUENCY: 1-2x/week  PT DURATION: 12 weeks  PLANNED INTERVENTIONS: 97110-Therapeutic exercises, 97530- Therapeutic activity, 97112- Neuromuscular re-education, 97535- Self Care, [id number]- Manual therapy, [id number]- Electrical stimulation (unattended), 97035- Ultrasound, Patient/Family education, Taping, Dry Needling, Joint mobilization, Spinal mobilization, Scar mobilization, Cryotherapy, Moist heat, and Biofeedback  PLAN FOR NEXT SESSION: diaphragmatic breathing, back and hip stretches, daily moisturizers for the vulva, scar massage education,    Eulis Foster, PT 04/06/23 4:04 PM

## 2023-04-15 ENCOUNTER — Encounter: Payer: Medicare Other | Admitting: Physical Therapy

## 2023-04-15 ENCOUNTER — Encounter: Payer: Self-pay | Admitting: Physical Therapy

## 2023-04-15 DIAGNOSIS — R278 Other lack of coordination: Secondary | ICD-10-CM

## 2023-04-15 DIAGNOSIS — N8110 Cystocele, unspecified: Secondary | ICD-10-CM | POA: Diagnosis not present

## 2023-04-15 DIAGNOSIS — R102 Pelvic and perineal pain unspecified side: Secondary | ICD-10-CM

## 2023-04-15 DIAGNOSIS — N3941 Urge incontinence: Secondary | ICD-10-CM | POA: Diagnosis not present

## 2023-04-15 DIAGNOSIS — M6281 Muscle weakness (generalized): Secondary | ICD-10-CM | POA: Diagnosis not present

## 2023-04-15 DIAGNOSIS — R159 Full incontinence of feces: Secondary | ICD-10-CM | POA: Diagnosis not present

## 2023-04-15 NOTE — Therapy (Signed)
 OUTPATIENT PHYSICAL THERAPY FEMALE PELVIC TREATMENT  Patient Name: Kathryn Pineda MRN: [id number] DOB:[birth year], 68 y.o., female Today's Date: 04/15/2023  END OF SESSION:  PT End of Session - 04/15/23 1307     Visit Number 3    Date for PT Re-Evaluation 06/24/23    Authorization Type [employer] medicare    Authorization - Visit Number 3    Authorization - Number of Visits 10    PT Start Time 1306    PT Stop Time 1355    PT Time Calculation (min) 49 min    Activity Tolerance Patient tolerated treatment well    Behavior During Therapy WFL for tasks assessed/performed             Past Medical History:  Diagnosis Date   Anal fissure    Arthritis    Biliary colic 5/96   ERCP w/spincterotomy   Condyloma    Deep vein thrombosis (DVT) of lower extremity (HCC)    Fatigue    loss of sleep   Generalized headaches    GERD (gastroesophageal reflux disease)    Hemorrhoid    Hiatal hernia    Migraines    Obesity    Phlebitis of leg    PONV (postoperative nausea and vomiting)    Primary osteoarthritis of both knees    Stress incontinence    Wilson's disease    Past Surgical History:  Procedure Laterality Date   ABDOMINAL HYSTERECTOMY     ANAL FISSURECTOMY  1986   ANTERIOR AND POSTERIOR REPAIR N/A 11/24/2016   Procedure: ANTERIOR (CYSTOCELE) AND POSTERIOR REPAIR (RECTOCELE);  Surgeon: Alfredo Martinez, MD;  Location: [facility];  Service: Urology;  Laterality: N/A;   BUNIONECTOMY Bilateral 2005   CESAREAN SECTION  1990   prolapsed cord   CYSTOSCOPY N/A 11/24/2016   Procedure: CYSTOSCOPY;  Surgeon: Alfredo Martinez, MD;  Location: [facility];  Service: Urology;  Laterality: N/A;   ENDOVENOUS ABLATION SAPHENOUS VEIN W/ LASER  2008   both lower legs   LAPAROSCOPIC CHOLECYSTECTOMY  1997   LAPAROSCOPIC GASTRIC SLEEVE RESECTION WITH HIATAL HERNIA REPAIR  09/16/2010   LAPAROSCOPIC VAGINAL HYSTERECTOMY WITH SALPINGECTOMY Bilateral 11/24/2016   Procedure: LAPAROSCOPIC ASSISTED VAGINAL  HYSTERECTOMY WITH SALPINGO-OOPHORECTOMY;  Surgeon: [name], MD;  Location: [facility];  Service: Gynecology;  Laterality: Bilateral;   LOWER EXTREMITY VENOGRAPHY  08/19/2022   Procedure: LOWER EXTREMITY VENOGRAPHY;  Surgeon: Victorino Sparrow, MD;  Location: [facility] INVASIVE CV LAB;  Service: Cardiovascular;;   PERIPHERAL VASCULAR THROMBECTOMY N/A 08/19/2022   Procedure: PERIPHERAL VASCULAR THROMBECTOMY;  Surgeon: Victorino Sparrow, MD;  Location: [facility] INVASIVE CV LAB;  Service: Cardiovascular;  Laterality: N/A;   PERIPHERAL VASCULAR ULTRASOUND/IVUS  08/19/2022   Procedure: Peripheral Vascular Ultrasound/IVUS;  Surgeon: Victorino Sparrow, MD;  Location: [facility] INVASIVE CV LAB;  Service: Cardiovascular;;   Patient Active Problem List   Diagnosis Date Noted   Urge urinary incontinence 02/03/2023   Abnormal urinalysis 02/03/2023   Vaginal atrophy 02/03/2023   Fecal incontinence 02/03/2023   Pulmonary emboli (HCC) 08/16/2022   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   Acute deep vein thrombosis (DVT) of femoral vein of left lower extremity (HCC) 08/16/2022   Internal hemorrhoids 07/24/2022   Thrombophlebitis of superficial veins of lower extremity 07/24/2022   History of DVT (deep vein thrombosis) 07/24/2022   Viral URI 04/10/2022   History of colonic polyps 07/15/2021   Rectocele 11/13/2016   Incomplete uterine prolapse 11/13/2016   Pelvic organ prolapse quantification stage  2 cystocele 11/13/2016   Bronchitis with bronchospasm 09/17/2014   Seasonal allergies 09/17/2014   Obesity (BMI 30-39.9) 12/24/2010   Hx of laparoscopic gastric banding 09/23/2010    PCP: Copland, [name], MD  REFERRING PROVIDER: Loleta Chance, MD   REFERRING DIAG:  N81.10 (ICD-10-CM) - Pelvic organ prolapse quantification stage 2 cystocele  N39.41 (ICD-10-CM) - Urge urinary incontinence  R15.9 (ICD-10-CM) - Incontinence of feces, unspecified fecal incontinence type    THERAPY DIAG:  Muscle  weakness (generalized)  Other lack of coordination  Pelvic pain  Rationale for Evaluation and Treatment: Rehabilitation  ONSET DATE: 2021  SUBJECTIVE:                                                                                                                                                                                           SUBJECTIVE STATEMENT: I can do the vaginal Kegel good but still working on the rectum. I am rarely leaking the rectal fluid. I have started my anti-inflammation diet and started exercise. I have not leaked urine. When a full bladder I have to becareful with bending forward. I felt the deep pressure cramp 1 time per week.     PAIN:  Are you having pain? Yes NPRS scale: 3/10 Pain location:  vaginal area  Pain type: deep pressure cramp Pain description: intermittent   Aggravating factors: cough Relieving factors: not coughing  PRECAUTIONS: None  RED FLAGS: None   WEIGHT BEARING RESTRICTIONS: No  FALLS:  Has patient fallen in last 6 months? No  OCCUPATION: congregational nurse  ACTIVITY LEVEL : teach  and hour of chair yoga and wt. Lifting, walking  PLOF: Independent  PATIENT GOALS: improve pelvic floor strength,   PERTINENT HISTORY:  Abdominal Hysterectomy; Anal fissurectomy; anterior and posterior repair 20 18; cesarean section; Cholecystectomy; laparoscopic gastric sleeve resection with hiatal hernia repair;   BOWEL MOVEMENT: Pain with bowel movement: No Type of bowel movement:Type (Bristol Stool Scale) Type 6 and 7, Frequency daily and several times per day, Strain Yes, and Splinting no rarely strains Fully empty rectum: Yes: sometimes but may have to come back to the toilet several times Leakage: Yes: had stool leakage in the past 2 weeks with this virus, stool leakage with coughing Pads: Yes: 1 main pad with liner, changes the liner 1 time Fiber supplement/laative Yes: metamucil mixed with benefiber  URINATION: Pain with  urination: No Fully empty bladder: Yes: 80% of the time, after bowel movement may have to urinate Stream: Strong Urgency: Yes: a little bit, if she bends and move too much Frequency: average Leakage:  has the urge to void then bends over  Pads: Yes: see above  INTERCOURSE:  Ability to have vaginal penetration Yes.   Pain with intercourse: Initial Penetration and During Penetration, felt very dry DrynessYesusing estrogen cream Climax: yes Marinoff Scale: 1/3  PREGNANCY: Vaginal deliveries 3 Tearing Yes: possible grade 3 Episiotomy No C-section deliveries 1 Currently pregnant No   OBJECTIVE:  Note: Objective measures were completed at Evaluation unless otherwise noted.  DIAGNOSTIC FINDINGS:  none    COGNITION: Overall cognitive status: Within functional limits for tasks assessed     SENSATION: Light touch: Appears intact   POSTURE: decreased lumbar lordosis and posterior pelvic tilt   LUMBARAROM/PROM:  A/PROM A/PROM  eval  Flexion Full with tightness in lumbar  Extension Decreased by 25%  Right lateral flexion Decreased by 25%  Left lateral flexion Decreased by 25%  Right rotation Decreased by 25%  Left rotation Decreased by 25%   (Blank rows = not tested)  LOWER EXTREMITY ROM: bilateral hip ROM is full  Passive ROM Right eval Left eval  Hip flexion 4/5 4/5  Hip abduction 4/5 4/5   (Blank rows = not tested)  LOWER EXTREMITY MMT:  MMT Right eval Left eval  Hip flexion 4/5 4/5  Hip abduction 4/5 4/5   (Blank rows = not tested) PALPATION:   Pelvic Alignment: ASIS are equal  Abdominal: tenderness located in the lower abdomen, along the left upper abdominal, decreased lower rib cage mobility; scar in lower abdominal area is restricted; can tighten the abdominals well                External Perineal Exam: tenderness located on left ischiocavernosus and perineal body                             Internal Pelvic Floor: tenderness located on the  iliococcygeus, rectum has hemorrhoids and looks aggravated with a cauliflower look posteriorly  Patient confirms identification and approves PT to assess internal pelvic floor and treatment Yes  PELVIC MMT:   MMT eval 04/15/23  Vaginal 2/5 no hug of finger   Internal Anal Sphincter 0/5   External Anal Sphincter 0/5   Puborectalis 0/5   (Blank rows = not tested)        TONE: increased  PROLAPSE: Anterior wall  TODAY'S TREATMENT:      04/15/23 Manual: Internal pelvic floor techniques: No emotional/communication barriers or cognitive limitation. Patient is motivated to learn. Patient understands and agrees with treatment goals and plan. PT explains patient will be examined in standing, sitting, and lying down to see how their muscles and joints work. When they are ready, they will be asked to remove their underwear so PT can examine their perineum. The patient is also given the option of providing their own chaperone as one is not provided in our facility. The patient also has the right and is explained the right to defer or refuse any part of the evaluation or treatment including the internal exam. With the patient's consent, PT will use one gloved finger to gently assess the muscles of the pelvic floor, seeing how well it contracts and relaxes and if there is muscle symmetry. After, the patient will get dressed and PT and patient will discuss exam findings and plan of care. PT and patient discuss plan of care, schedule, attendance policy and HEP activities.  Manual work outside the rectum due to increased tissue coming out that may be a rectal prolapse and the tissue was a very dark  purple red color and looked irritated.  Working on the perineal body, ischiocavernosus, superior transverse perineum, levator ani and external anal sphincter Educated patient on how to use her new tool the Kiwi around the areas to work on lengthening the tissue Neuromuscular re-education: Pelvic floor  contraction training: She is laying on her left side with therapist giving tactile cues outside the rectum to contract the anus and lift it while not contracting the gluteals. Patient does best if she contracts slowly to isolate the muscles. She responds to the verbal cue to lift her bladder and feel the coccyx bone come forward.  Rectal contraction with pressing hand into mat to engage the abdominals while contracting the pelvic floor.  ESelf-care: Discussed with patient on not trying the pelvic floor exercises on the commode due to the tissue will be spread apart. How she is to use her preparation H on the rectum due to the tissue is red and swollen.     04/06/23 Manual: Soft tissue mobilization: Manual work to the diaphragm to improve breath Manual work to the abdominals to loosen up the tissue Scar tissue mobilization: Scar work to the lower abdominal scar moving through the different planes of fascia and pulling up on the scar Myofascial release: Fascial release around the abdomen to move the skin over the muscle and improve tissue mobility Tissue rolling of the lower rib cage Neuromuscular re-education: Down training: Diaphragmatic breathing with tactile cues to expand the ribs and have the air go into the abdomen to the pelvic floor Exercises: Stretches/mobility: Piriformis stretch supine holding 30 sec bil. Pulling leg across trunk holding 30 sec bil.  Cat cow 15 times Quadruped rock with hips internally rotated and keeping spinal neutral to stretch the pelvic floor Therapeutic activities: Functional strengthening activities: Patient has showed therapist her exercise routine with engagement of the core, keeping the rib cage over the pelvis and breath out with hardest part                                                                                                                            PATIENT EDUCATION:  04/07/23 Education details: educated patient on the Ohnut to  reduce the depth of the penis in the vagina, cream for rectum to reduce the inflammation of the hemorrhoids and the kiwi to use for tissue manipulation around the vaginal area and pelvic floor musclesAccess Code: [email or web address] Person educated: Patient Education method: Explanation, Demonstration, Tactile cues, Verbal cues, and Handouts Education comprehension: verbalized understanding, returned demonstration, verbal cues required, tactile cues required, and needs further education  HOME EXERCISE PROGRAM: 04/06/23 Access Code: [email or web address] URL: [email or web address] Date: 04/06/2023 Prepared by: Eulis Foster  Exercises - Supine Figure 4 Piriformis Stretch  - 1 x daily - 7 x weekly - 1 sets - 1 reps - 30 sec hold - Supine Diaphragmatic Breathing  - 1 x daily - 7 x weekly - 1 sets - 10 reps - Cat Cow  - 1  x daily - 7 x weekly - 1 sets - 10 reps - Quadruped Rocking Slow  - 1 x daily - 7 x weekly - 1 sets - 10 reps  ASSESSMENT:  CLINICAL IMPRESSION: Patient is a 68 y.o. female who was seen today for physical therapy  treatment for cystocele, fecal incontinence, and urge incontinence. Rectal strength is 1/5 while feeling the external anal sphincter contract and raise up. Therapist not able to place her finger internally into the rectum due to the irritated tissue. The tissue reduced after the manual work. She understands how to use the Ohnut with penile penetration to improve comfort and purchased one. She purchased the [employer] and understands how to use it around the perineum to reduce tension in the tissue.  She has not leaked urine since last visit and has a reduction of fluid coming out of the rectum. Patient will benefit from skilled therapy to improve pelvic floor strength and coordination to reduce leakage and pain.   OBJECTIVE IMPAIRMENTS: decreased coordination, decreased strength, increased fascial restrictions, increased muscle spasms, and pain.   ACTIVITY LIMITATIONS: bending,  continence, and toileting  PARTICIPATION LIMITATIONS: interpersonal relationship and community activity  PERSONAL FACTORS: Age, Fitness, and 3+ comorbidities: Abdominal Hysterectomy; Anal fissurectomy; anterior and posterior repair 20 18; cesarean section; Cholecystectomy; laparoscopic gastric sleeve resection with hiatal hernia repair;   are also affecting patient's functional outcome.   REHAB POTENTIAL: Excellent  CLINICAL DECISION MAKING: Evolving/moderate complexity  EVALUATION COMPLEXITY: Moderate   GOALS: Goals reviewed with patient? Yes  SHORT TERM GOALS: Target date: 05/30/23  Patient independent with initial HEP for flexibility exercises.  Baseline: Goal status: INITIAL  2.  Patient educated on scar massage.  Baseline:  Goal status: INITIAL  3.  Patient educated on devices to assist with pelvic pain to relax the tissue.  Baseline:  Goal status: Met 04/15/23  4.  Patient educated on daily vaginal moisturizers to improve the health of the tissue.  Baseline:  Goal status: INITIAL   LONG TERM GOALS: Target date: 06/24/23  Patient is independent with advanced HEP for pelvic floor and hip strength.  Baseline:  Goal status: INITIAL  2.  Patient is able to perform diaphragmatic breathing to bulge the pelvic floor to be able to push stool out without straining.  Baseline:  Goal status: INITIAL  3.  Patient pelvic floor strength is >/= 3/5 with hug of therapist finger to reduce her urinary leakage >/= 75%.  Baseline:  Goal status: INITIAL  4.  Patient rectal strength is >/= 3/5 to reduce her stool leakage >/= 75%.  Baseline:  Goal status: INITIAL  5.  Patient reports her pain with coughing has decreased due to the ability to contract the pelvic floor.  Baseline:  Goal status: INITIAL  6.  Patient is able to have penile penetration vaginally with reduction of pain due to improving the health of her tissue and reduction of trigger points in the pelvic floor.   Baseline:  Goal status: INITIAL  PLAN:  PT FREQUENCY: 1-2x/week  PT DURATION: 12 weeks  PLANNED INTERVENTIONS: 97110-Therapeutic exercises, 97530- Therapeutic activity, 97112- Neuromuscular re-education, 97535- Self Care, 96045- Manual therapy, [id number]- Electrical stimulation (unattended), 97035- Ultrasound, Patient/Family education, Taping, Dry Needling, Joint mobilization, Spinal mobilization, Scar mobilization, Cryotherapy, Moist heat, and Biofeedback  PLAN FOR NEXT SESSION: diaphragmatic breathing, back and hip stretches,  scar massage education, pelvic floor strength   Eulis Foster, PT 04/15/23 2:11 PM

## 2023-04-22 ENCOUNTER — Encounter: Payer: Self-pay | Attending: Obstetrics | Admitting: Physical Therapy

## 2023-04-22 ENCOUNTER — Encounter: Payer: Self-pay | Admitting: Physical Therapy

## 2023-04-22 DIAGNOSIS — R102 Pelvic and perineal pain: Secondary | ICD-10-CM | POA: Diagnosis not present

## 2023-04-22 DIAGNOSIS — R278 Other lack of coordination: Secondary | ICD-10-CM | POA: Diagnosis not present

## 2023-04-22 DIAGNOSIS — M6281 Muscle weakness (generalized): Secondary | ICD-10-CM | POA: Diagnosis not present

## 2023-04-22 NOTE — Therapy (Signed)
 OUTPATIENT PHYSICAL THERAPY FEMALE PELVIC TREATMENT  Patient Name: CORDA SHUTT MRN: [id number] DOB:[birth year], 68 y.o., female Today's Date: 04/22/2023  END OF SESSION:  PT End of Session - 04/22/23 1510     Visit Number 4    Date for PT Re-Evaluation 06/24/23    Authorization Type [employer] medicare    Authorization - Visit Number 4    Authorization - Number of Visits 10    PT Start Time 1508    PT Stop Time 1550    PT Time Calculation (min) 42 min    Activity Tolerance Patient tolerated treatment well    Behavior During Therapy WFL for tasks assessed/performed             Past Medical History:  Diagnosis Date   Anal fissure    Arthritis    Biliary colic 5/96   ERCP w/spincterotomy   Condyloma    Deep vein thrombosis (DVT) of lower extremity (HCC)    Fatigue    loss of sleep   Generalized headaches    GERD (gastroesophageal reflux disease)    Hemorrhoid    Hiatal hernia    Migraines    Obesity    Phlebitis of leg    PONV (postoperative nausea and vomiting)    Primary osteoarthritis of both knees    Stress incontinence    Wilson's disease    Past Surgical History:  Procedure Laterality Date   ABDOMINAL HYSTERECTOMY     ANAL FISSURECTOMY  1986   ANTERIOR AND POSTERIOR REPAIR N/A 11/24/2016   Procedure: ANTERIOR (CYSTOCELE) AND POSTERIOR REPAIR (RECTOCELE);  Surgeon: Alfredo Martinez, MD;  Location: [facility];  Service: Urology;  Laterality: N/A;   BUNIONECTOMY Bilateral 2005   CESAREAN SECTION  1990   prolapsed cord   CYSTOSCOPY N/A 11/24/2016   Procedure: CYSTOSCOPY;  Surgeon: Alfredo Martinez, MD;  Location: [facility];  Service: Urology;  Laterality: N/A;   ENDOVENOUS ABLATION SAPHENOUS VEIN W/ LASER  2008   both lower legs   LAPAROSCOPIC CHOLECYSTECTOMY  1997   LAPAROSCOPIC GASTRIC SLEEVE RESECTION WITH HIATAL HERNIA REPAIR  09/16/2010   LAPAROSCOPIC VAGINAL HYSTERECTOMY WITH SALPINGECTOMY Bilateral 11/24/2016   Procedure: LAPAROSCOPIC ASSISTED VAGINAL  HYSTERECTOMY WITH SALPINGO-OOPHORECTOMY;  Surgeon: [name], MD;  Location: [facility];  Service: Gynecology;  Laterality: Bilateral;   LOWER EXTREMITY VENOGRAPHY  08/19/2022   Procedure: LOWER EXTREMITY VENOGRAPHY;  Surgeon: Victorino Sparrow, MD;  Location: [facility] INVAS[facility] CV [facility];  Service: Cardiovascular;;   PERIPHERAL VASCULAR THROMBECTOMY N/A 08/19/2022   Procedure: PERIPHERAL VASCULAR THROMBECTOMY;  Surgeon: Victorino Sparrow, MD;  Location: [facility] INVASIVE CV LAB;  Service: Cardiovascular;  Laterality: N/A;   PERIPHERAL VASCULAR ULTRASOUND/IVUS  08/19/2022   Procedure: Peripheral Vascular Ultrasound/IVUS;  Surgeon: Victorino Sparrow, MD;  Location: [facility] INVASIVE CV LAB;  Service: Cardiovascular;;   Patient Active Problem List   Diagnosis Date Noted   Urge urinary incontinence 02/03/2023   Abnormal urinalysis 02/03/2023   Vaginal atrophy 02/03/2023   Fecal incontinence 02/03/2023   Pulmonary emboli (HCC) 08/16/2022   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   Acute deep vein thrombosis (DVT) of femoral vein of left lower extremity (HCC) 08/16/2022   Internal hemorrhoids 07/24/2022   Thrombophlebitis of superficial veins of lower extremity 07/24/2022   History of DVT (deep vein thrombosis) 07/24/2022   Viral URI 04/10/2022   History of colonic polyps 07/15/2021   Rectocele 11/13/2016   Incomplete uterine prolapse 11/13/2016   Pelvic organ prolapse quantification stage  2 cystocele 11/13/2016   Bronchitis with bronchospasm 09/17/2014   Seasonal allergies 09/17/2014   Obesity (BMI 30-39.9) 12/24/2010   Hx of laparoscopic gastric banding 09/23/2010    PCP: Copland, [name], MD  REFERRING PROVIDER: Loleta Chance, MD   REFERRING DIAG:  N81.10 (ICD-10-CM) - Pelvic organ prolapse quantification stage 2 cystocele  N39.41 (ICD-10-CM) - Urge urinary incontinence  R15.9 (ICD-10-CM) - Incontinence of feces, unspecified fecal incontinence type    THERAPY DIAG:  Muscle  weakness (generalized)  Other lack of coordination  Pelvic pain  Rationale for Evaluation and Treatment: Rehabilitation  ONSET DATE: 2021  SUBJECTIVE:                                                                                                                                                                                           SUBJECTIVE STATEMENT: I have been able to feel the anal contraction now. Patient feels the deep vaginal cramp less and 20% better. I did the abdominal massage to help the stool when it was blocked.   PAIN:  Are you having pain? Yes NPRS scale: 3/10 Pain location:  vaginal area  Pain type: deep pressure cramp Pain description: intermittent   Aggravating factors: cough Relieving factors: not coughing  PRECAUTIONS: None  RED FLAGS: None   WEIGHT BEARING RESTRICTIONS: No  FALLS:  Has patient fallen in last 6 months? No  OCCUPATION: congregational nurse  ACTIVITY LEVEL : teach  and hour of chair yoga and wt. Lifting, walking  PLOF: Independent  PATIENT GOALS: improve pelvic floor strength,   PERTINENT HISTORY:  Abdominal Hysterectomy; Anal fissurectomy; anterior and posterior repair 20 18; cesarean section; Cholecystectomy; laparoscopic gastric sleeve resection with hiatal hernia repair;   BOWEL MOVEMENT: Pain with bowel movement: No Type of bowel movement:Type (Bristol Stool Scale) Type 6 and 7, Frequency daily and several times per day, Strain Yes, and Splinting no rarely strains Fully empty rectum: Yes: sometimes but may have to come back to the toilet several times Leakage: Yes: had stool leakage in the past 2 weeks with this virus, stool leakage with coughing Pads: Yes: 1 main pad with liner, changes the liner 1 time Fiber supplement/laative Yes: metamucil mixed with benefiber  URINATION: Pain with urination: No Fully empty bladder: Yes: 80% of the time, after bowel movement may have to urinate Stream: Strong Urgency: Yes: a  little bit, if she bends and move too much Frequency: average Leakage:  has the urge to void then bends over Pads: Yes: see above  INTERCOURSE:  Ability to have vaginal penetration Yes.   Pain with intercourse: Initial Penetration and During Penetration, felt  very dry DrynessYesusing estrogen cream Climax: yes Marinoff Scale: 1/3  PREGNANCY: Vaginal deliveries 3 Tearing Yes: possible grade 3 Episiotomy No C-section deliveries 1 Currently pregnant No   OBJECTIVE:  Note: Objective measures were completed at Evaluation unless otherwise noted.  DIAGNOSTIC FINDINGS:  none    COGNITION: Overall cognitive status: Within functional limits for tasks assessed     SENSATION: Light touch: Appears intact   POSTURE: decreased lumbar lordosis and posterior pelvic tilt   LUMBARAROM/PROM:  A/PROM A/PROM  eval  Flexion Full with tightness in lumbar  Extension Decreased by 25%  Right lateral flexion Decreased by 25%  Left lateral flexion Decreased by 25%  Right rotation Decreased by 25%  Left rotation Decreased by 25%   (Blank rows = not tested)  LOWER EXTREMITY ROM: bilateral hip ROM is full  Passive ROM Right eval Left eval  Hip flexion 4/5 4/5  Hip abduction 4/5 4/5   (Blank rows = not tested)  LOWER EXTREMITY MMT:  MMT Right eval Left eval  Hip flexion 4/5 4/5  Hip abduction 4/5 4/5   (Blank rows = not tested) PALPATION:   Pelvic Alignment: ASIS are equal  Abdominal: tenderness located in the lower abdomen, along the left upper abdominal, decreased lower rib cage mobility; scar in lower abdominal area is restricted; can tighten the abdominals well                External Perineal Exam: tenderness located on left ischiocavernosus and perineal body                             Internal Pelvic Floor: tenderness located on the iliococcygeus, rectum has hemorrhoids and looks aggravated with a cauliflower look posteriorly  Patient confirms identification and  approves PT to assess internal pelvic floor and treatment Yes  PELVIC MMT:   MMT eval 04/15/23  Vaginal 2/5 no hug of finger   Internal Anal Sphincter 0/5   External Anal Sphincter 0/5   Puborectalis 0/5   (Blank rows = not tested)        TONE: increased  PROLAPSE: Anterior wall  TODAY'S TREATMENT:      04/22/23 Manual: Soft tissue mobilization: Manual work to the diaphragm to increase mobility, manual work to the obliques and all over the abdomen to improve mobility of the tissue Scar tissue mobilization: Myofascial release: Fascial release of the mesenteric root, suprapubically and right side of the abdomen Neuromuscular re-education: Down training: Sitting and stretch the diaphragm to prepare for diaphragmatic breathing Sitting working on diaphragmatic breathing to relax the pelvic floor.  Therapeutic activities: Functional strengthening activities: Educated patient on how to breath with having a bowel movement with first using diaphragmatic breathing, then forceful air out to push the stool out and feet on the stool. Patient had trouble with relaxing the anus when pushing air out. She needed more verbal cues to not raise the chest instead have the abdomen fill up with air.   04/15/23 Manual: Internal pelvic floor techniques: No emotional/communication barriers or cognitive limitation. Patient is motivated to learn. Patient understands and agrees with treatment goals and plan. PT explains patient will be examined in standing, sitting, and lying down to see how their muscles and joints work. When they are ready, they will be asked to remove their underwear so PT can examine their perineum. The patient is also given the option of providing their own chaperone as one is not provided in our facility.  The patient also has the right and is explained the right to defer or refuse any part of the evaluation or treatment including the internal exam. With the patient's consent, PT will use  one gloved finger to gently assess the muscles of the pelvic floor, seeing how well it contracts and relaxes and if there is muscle symmetry. After, the patient will get dressed and PT and patient will discuss exam findings and plan of care. PT and patient discuss plan of care, schedule, attendance policy and HEP activities.  Manual work outside the rectum due to increased tissue coming out that may be a rectal prolapse and the tissue was a very dark purple red color and looked irritated.  Working on the perineal body, ischiocavernosus, superior transverse perineum, levator ani and external anal sphincter Educated patient on how to use her new tool the Kiwi around the areas to work on lengthening the tissue Neuromuscular re-education: Pelvic floor contraction training: She is laying on her left side with therapist giving tactile cues outside the rectum to contract the anus and lift it while not contracting the gluteals. Patient does best if she contracts slowly to isolate the muscles. She responds to the verbal cue to lift her bladder and feel the coccyx bone come forward.  Rectal contraction with pressing hand into mat to engage the abdominals while contracting the pelvic floor.  ESelf-care: Discussed with patient on not trying the pelvic floor exercises on the commode due to the tissue will be spread apart. How she is to use her preparation H on the rectum due to the tissue is red and swollen.     04/06/23 Manual: Soft tissue mobilization: Manual work to the diaphragm to improve breath Manual work to the abdominals to loosen up the tissue Scar tissue mobilization: Scar work to the lower abdominal scar moving through the different planes of fascia and pulling up on the scar Myofascial release: Fascial release around the abdomen to move the skin over the muscle and improve tissue mobility Tissue rolling of the lower rib cage Neuromuscular re-education: Down training: Diaphragmatic breathing  with tactile cues to expand the ribs and have the air go into the abdomen to the pelvic floor Exercises: Stretches/mobility: Piriformis stretch supine holding 30 sec bil. Pulling leg across trunk holding 30 sec bil.  Cat cow 15 times Quadruped rock with hips internally rotated and keeping spinal neutral to stretch the pelvic floor Therapeutic activities: Functional strengthening activities: Patient has showed therapist her exercise routine with engagement of the core, keeping the rib cage over the pelvis and breath out with hardest part                                                                                                                            PATIENT EDUCATION:  04/07/23 Education details: educated patient on the [place] to reduce the depth of the penis in the vagina, cream for rectum to reduce the inflammation of the hemorrhoids and  the kiwi to use for tissue manipulation around the vaginal area and pelvic floor musclesAccess Code: [email or web address] Person educated: Patient Education method: Explanation, Demonstration, Tactile cues, Verbal cues, and Handouts Education comprehension: verbalized understanding, returned demonstration, verbal cues required, tactile cues required, and needs further education  HOME EXERCISE PROGRAM: 04/06/23 Access Code: [email or web address] URL: [email or web address] Date: 04/06/2023 Prepared by: Eulis Foster  Exercises - Supine Figure 4 Piriformis Stretch  - 1 x daily - 7 x weekly - 1 sets - 1 reps - 30 sec hold - Supine Diaphragmatic Breathing  - 1 x daily - 7 x weekly - 1 sets - 10 reps - Cat Cow  - 1 x daily - 7 x weekly - 1 sets - 10 reps - Quadruped Rocking Slow  - 1 x daily - 7 x weekly - 1 sets - 10 reps  ASSESSMENT:  CLINICAL IMPRESSION: Patient is a 68 y.o. female who was seen today for physical therapy  treatment for cystocele, fecal incontinence, and urge incontinence. Patient leakage for urine is 90% better.  Patient has difficulty with  feeling her anus relax when demonstrating forceful breath to push the stool out. She has increased fascial restrictions in the diaphragm especially on the right. Her abdomen was firm prior to treatment. Patient will benefit from skilled therapy to improve pelvic floor strength and coordination to reduce leakage and pain.   OBJECTIVE IMPAIRMENTS: decreased coordination, decreased strength, increased fascial restrictions, increased muscle spasms, and pain.   ACTIVITY LIMITATIONS: bending, continence, and toileting  PARTICIPATION LIMITATIONS: interpersonal relationship and community activity  PERSONAL FACTORS: Age, Fitness, and 3+ comorbidities: Abdominal Hysterectomy; Anal fissurectomy; anterior and posterior repair 20 18; cesarean section; Cholecystectomy; laparoscopic gastric sleeve resection with hiatal hernia repair;   are also affecting patient's functional outcome.   REHAB POTENTIAL: Excellent  CLINICAL DECISION MAKING: Evolving/moderate complexity  EVALUATION COMPLEXITY: Moderate   GOALS: Goals reviewed with patient? Yes  SHORT TERM GOALS: Target date: 05/30/23  Patient independent with initial HEP for flexibility exercises.  Baseline: Goal status: Met 04/22/23  2.  Patient educated on scar massage.  Baseline:  Goal status: Met 04/22/23  3.  Patient educated on devices to assist with pelvic pain to relax the tissue.  Baseline:  Goal status: Met 04/15/23  4.  Patient educated on daily vaginal moisturizers to improve the health of the tissue.  Baseline:  Goal status: Met 04/22/23   LONG TERM GOALS: Target date: 06/24/23  Patient is independent with advanced HEP for pelvic floor and hip strength.  Baseline:  Goal status: INITIAL  2.  Patient is able to perform diaphragmatic breathing to bulge the pelvic floor to be able to push stool out without straining.  Baseline:  Goal status: INITIAL  3.  Patient pelvic floor strength is >/= 3/5 with hug of therapist finger to reduce her  urinary leakage >/= 75%.  Baseline:  Goal status: INITIAL  4.  Patient rectal strength is >/= 3/5 to reduce her stool leakage >/= 75%.  Baseline:  Goal status: INITIAL  5.  Patient reports her pain with coughing has decreased due to the ability to contract the pelvic floor.  Baseline:  Goal status: INITIAL  6.  Patient is able to have penile penetration vaginally with reduction of pain due to improving the health of her tissue and reduction of trigger points in the pelvic floor.  Baseline:  Goal status: INITIAL  PLAN:  PT FREQUENCY: 1-2x/week  PT DURATION: 12 weeks  PLANNED INTERVENTIONS: 97110-Therapeutic exercises, 97530- Therapeutic  activity, [id number]- Neuromuscular re-education, [phone]- Self Care, 60454- Manual therapy, 97014- Electrical stimulation (unattended), 97035- Ultrasound, Patient/Family education, Taping, Dry Needling, Joint mobilization, Spinal mobilization, Scar mobilization, Cryotherapy, Moist heat, and Biofeedback  PLAN FOR NEXT SESSION:  back and hip stretches,  scar massage education, check pelvic floor strength, see how toileting is going   Eulis Foster, PT 04/22/23 4:01 PM

## 2023-04-22 NOTE — Patient Instructions (Signed)

## 2023-04-26 ENCOUNTER — Ambulatory Visit: Payer: Self-pay | Attending: Obstetrics | Admitting: Physical Therapy

## 2023-05-06 ENCOUNTER — Other Ambulatory Visit (HOSPITAL_BASED_OUTPATIENT_CLINIC_OR_DEPARTMENT_OTHER): Payer: Self-pay

## 2023-05-06 ENCOUNTER — Encounter: Payer: Self-pay | Admitting: Obstetrics

## 2023-05-06 ENCOUNTER — Ambulatory Visit: Payer: Medicare Other | Admitting: Obstetrics

## 2023-05-06 VITALS — BP 102/56 | HR 63

## 2023-05-06 DIAGNOSIS — R159 Full incontinence of feces: Secondary | ICD-10-CM

## 2023-05-06 DIAGNOSIS — N8110 Cystocele, unspecified: Secondary | ICD-10-CM | POA: Diagnosis not present

## 2023-05-06 DIAGNOSIS — R8290 Unspecified abnormal findings in urine: Secondary | ICD-10-CM

## 2023-05-06 DIAGNOSIS — N3941 Urge incontinence: Secondary | ICD-10-CM | POA: Diagnosis not present

## 2023-05-06 DIAGNOSIS — N941 Unspecified dyspareunia: Secondary | ICD-10-CM | POA: Insufficient documentation

## 2023-05-06 DIAGNOSIS — N952 Postmenopausal atrophic vaginitis: Secondary | ICD-10-CM

## 2023-05-06 MED ORDER — LIDOCAINE 5 % EX OINT
TOPICAL_OINTMENT | CUTANEOUS | 0 refills | Status: DC
  Filled 2023-05-06: qty 35.44, 28d supply, fill #0

## 2023-05-06 NOTE — Patient Instructions (Addendum)
 Continue vaginal estrogen 1g twice a week.   Continue gemtesa, if you desires to discontinue in 4 weeks.   Continue pelvic floor PT  Use lubrication and topical lidocaine as needed prior to intercourse.

## 2023-05-06 NOTE — Assessment & Plan Note (Addendum)
-   noted prior to surgery, continuous seepage daily with 1/4 teaspoon size area on pad - reduced since dietary changes and pelvic floor  - pt reports concerns with fistula, dimple noted at the right vaginal apex with no stool noted in vaginal vault or extravasation with valsalva. Encouraged pt to consider tampon placement to assess source if she is concerned after vaginal estrogen and pelvic floor PT if refractory symptoms - Treatment options include anti-diarrhea medication (loperamide/ Imodium OTC or prescription lomotil), fiber supplements, physical therapy, and possible sacral neuromodulation or surgery.   - continue pelvic floor PT and Kegel exercises - trial of IBGuard due to food triggers reported, sample provided

## 2023-05-06 NOTE — Assessment & Plan Note (Addendum)
-   POCT + heme, negative UA micro and culture - denies UTI symptoms - continue vaginal estrogen

## 2023-05-06 NOTE — Assessment & Plan Note (Signed)
-   LAVH, anterior and posterior repair by Dr. [name] and Dr. Sherron Monday 11/24/16 - asymptomatic - For treatment of pelvic organ prolapse, we discussed options for management including expectant management, conservative management, and surgical management, such as Kegels, a pessary, pelvic floor physical therapy, and specific surgical procedures. - continue pelvic floor PT and expectant mgmt at this time

## 2023-05-06 NOTE — Assessment & Plan Note (Signed)
 For symptomatic vaginal atrophy options include lubrication with a water-based lubricant, personal hygiene measures and barrier protection against wetness, and estrogen replacement in the form of vaginal cream, vaginal tablets, or a time-released vaginal ring.   - continue low dose vaginal estrogen due to relief - explained that history of DVT and PE is not a contraindication for vaginal estrogen, currently on Eliquis

## 2023-05-06 NOTE — Progress Notes (Signed)
 [facility] Return Visit  SUBJECTIVE  History of Present Illness: Kathryn Pineda is a 68 y.o. female seen in follow-up for stage II pelvic organ prolapse, urgency urinary incontinence, fecal incontinence, and vaginal atrophy. Plan at last visit was referral for pelvic floor PT, gemtesa and vaginal estrogen.   Pelvic floor PT with relief of symptoms. Increased walking and exercises Tried to wean off gemtesa after 6 weeks of PT, symptoms returned within 2 weeks.  Previously moisture in underwear unclear if it was urinary and fecal incontinence.  Moisture on pad 2-3x/month, associated with missing metamucil or dietary changes.  Denies UUI, maybe rare droplet of moisture Vaginal estrogen with relief of dryness and discomfort.  Reports decreased size of vaginal bulge.  Started dietary modification to reduce inflammation.  Reduced 1-2 cups of coffee Goal is to comfortably have intercourse  Past Medical History: Patient  has a past medical history of Anal fissure, Arthritis, Biliary colic (5/96), Condyloma, Deep vein thrombosis (DVT) of lower extremity (HCC), Fatigue, Generalized headaches, GERD (gastroesophageal reflux disease), Hemorrhoid, Hiatal hernia, Migraines, Obesity, Phlebitis of leg, PONV (postoperative nausea and vomiting), Primary osteoarthritis of both knees, Stress incontinence, and Wilson's disease.   Past Surgical History: She  has a past surgical history that includes Anal fissurectomy (1986); Laparoscopic cholecystectomy (1997); Bunionectomy (Bilateral, 2005); Endovenous ablation saphenous vein w/ laser (2008); Cesarean section (1990); Laparoscopic gastric sleeve resection with hiatal hernia repair (09/16/2010); Laparoscopic vaginal hysterectomy with salpingectomy (Bilateral, 11/24/2016); Cystoscopy (N/A, 11/24/2016); Anterior and posterior repair (N/A, 11/24/2016); Abdominal hysterectomy; PERIPHERAL VASCULAR THROMBECTOMY (N/A, 08/19/2022); LOWER EXTREMITY VENOGRAPHY  (08/19/2022); and Peripheral Vascular Ultrasound/IVUS (08/19/2022).   Medications: She has a current medication list which includes the following prescription(s): acetaminophen, albuterol, apixaban, vitamin d3, cyanocobalamin, epinephrine, estradiol, lidocaine, loratadine, multivitamin, emergen-c immune, and gemtesa.   Allergies: Patient is allergic to penicillins, adhesive [tape], and paxlovid [nirmatrelvir-ritonavir].   Social History: Patient  reports that she has never smoked. She has never used smokeless tobacco. She reports current alcohol use of about 1.0 standard drink of alcohol per week. She reports that she does not use drugs.     OBJECTIVE     Physical Exam: Vitals:   05/06/23 0919  BP: (!) 102/56  Pulse: 63   Gen: No apparent distress, A&O x 3.  Detailed Urogynecologic Evaluation:  Deferred.       ASSESSMENT AND PLAN    Ms. Plourde is a 68 y.o. with:  1. Vaginal atrophy   2. Incontinence of feces, unspecified fecal incontinence type   3. Urge urinary incontinence   4. Pelvic organ prolapse quantification stage 2 cystocele   5. Dyspareunia, female   6. Abnormal urinalysis     Vaginal atrophy Assessment & Plan: For symptomatic vaginal atrophy options include lubrication with a water-based lubricant, personal hygiene measures and barrier protection against wetness, and estrogen replacement in the form of vaginal cream, vaginal tablets, or a time-released vaginal ring.   - continue low dose vaginal estrogen due to relief - explained that history of DVT and PE is not a contraindication for vaginal estrogen, currently on Eliquis  Orders: -     Lidocaine; Use 0.5g up to 3 times a day as needed for pain.  Dispense: 35.44 g; Refill: 0  Incontinence of feces, unspecified fecal incontinence type Assessment & Plan: - noted prior to surgery, continuous seepage daily with 1/4 teaspoon size area on pad - reduced since dietary changes and pelvic floor  - pt reports concerns  with fistula, dimple noted  at the right vaginal apex with no stool noted in vaginal vault or extravasation with valsalva. Encouraged pt to consider tampon placement to assess source if she is concerned after vaginal estrogen and pelvic floor PT if refractory symptoms - Treatment options include anti-diarrhea medication (loperamide/ Imodium OTC or prescription lomotil), fiber supplements, physical therapy, and possible sacral neuromodulation or surgery.   - continue pelvic floor PT and Kegel exercises - trial of IBGuard due to food triggers reported, sample provided     Urge urinary incontinence Assessment & Plan: - POCT UA +heme, negative repeat lab UA, bladder scan 23mL - We discussed the symptoms of overactive bladder (OAB), which include urinary urgency, urinary frequency, nocturia, with or without urge incontinence.  While we do not know the exact etiology of OAB, several treatment options exist. We discussed management including behavioral therapy (decreasing bladder irritants, urge suppression strategies, timed voids, bladder retraining), physical therapy, medication; for refractory cases posterior tibial nerve stimulation, sacral neuromodulation, and intravesical botulinum toxin injection.  For anticholinergic medications, we discussed the potential side effects of anticholinergics including dry eyes, dry mouth, constipation, cognitive impairment and urinary retention. For Beta-3 agonist medication, we discussed the potential side effect of elevated blood pressure which is more likely to occur in individuals with uncontrolled hypertension. - symptoms improved with gemtesa, discontinued with return of symptoms. Patient can repeat trial with discontinuation if desired   Pelvic organ prolapse quantification stage 2 cystocele Assessment & Plan: - LAVH, anterior and posterior repair by Dr. [name] and Dr. Sherron Monday 11/24/16 - asymptomatic - For treatment of pelvic organ prolapse, we discussed  options for management including expectant management, conservative management, and surgical management, such as Kegels, a pessary, pelvic floor physical therapy, and specific surgical procedures. - continue pelvic floor PT and expectant mgmt at this time    Dyspareunia, female Assessment & Plan: - goal to resume intercourse without pain - encouraged lubrication use, vaginal estrogen, and position change - Rx lidocaine for use prior to intercourse if needed - encourage Onut use as needed to control depth of penetration - continue pelvic floor PT - lubrication samples provided.  Orders: -     Lidocaine; Use 0.5g up to 3 times a day as needed for pain.  Dispense: 35.44 g; Refill: 0  Abnormal urinalysis Assessment & Plan: - POCT + heme, negative UA micro and culture - denies UTI symptoms - continue vaginal estrogen   Time spent: I spent 26 minutes dedicated to the care of this patient on the date of this encounter to include pre-visit review of records, face-to-face time with the patient discussing urgency urinary incontinence, fecal incontinence, dyspareunia, vaginal atrophy, stage II pelvic organ prolapse, and post visit documentation and ordering medication/ testing.    Loleta Chance, MD

## 2023-05-06 NOTE — Assessment & Plan Note (Signed)
-   goal to resume intercourse without pain - encouraged lubrication use, vaginal estrogen, and position change - Rx lidocaine for use prior to intercourse if needed - encourage Onut use as needed to control depth of penetration - continue pelvic floor PT - lubrication samples provided.

## 2023-05-06 NOTE — Assessment & Plan Note (Signed)
-   POCT UA +heme, negative repeat lab UA, bladder scan 23mL - We discussed the symptoms of overactive bladder (OAB), which include urinary urgency, urinary frequency, nocturia, with or without urge incontinence.  While we do not know the exact etiology of OAB, several treatment options exist. We discussed management including behavioral therapy (decreasing bladder irritants, urge suppression strategies, timed voids, bladder retraining), physical therapy, medication; for refractory cases posterior tibial nerve stimulation, sacral neuromodulation, and intravesical botulinum toxin injection.  For anticholinergic medications, we discussed the potential side effects of anticholinergics including dry eyes, dry mouth, constipation, cognitive impairment and urinary retention. For Beta-3 agonist medication, we discussed the potential side effect of elevated blood pressure which is more likely to occur in individuals with uncontrolled hypertension. - symptoms improved with gemtesa, discontinued with return of symptoms. Patient can repeat trial with discontinuation if desired

## 2023-05-10 ENCOUNTER — Other Ambulatory Visit: Payer: Self-pay

## 2023-05-10 ENCOUNTER — Other Ambulatory Visit (HOSPITAL_BASED_OUTPATIENT_CLINIC_OR_DEPARTMENT_OTHER): Payer: Self-pay

## 2023-05-17 ENCOUNTER — Other Ambulatory Visit (HOSPITAL_BASED_OUTPATIENT_CLINIC_OR_DEPARTMENT_OTHER): Payer: Self-pay

## 2023-06-02 ENCOUNTER — Encounter: Payer: Self-pay | Admitting: Medical Oncology

## 2023-06-02 ENCOUNTER — Inpatient Hospital Stay: Payer: Medicare Other | Admitting: Medical Oncology

## 2023-06-02 ENCOUNTER — Inpatient Hospital Stay: Payer: Medicare Other | Attending: Hematology & Oncology

## 2023-06-02 VITALS — BP 116/55 | HR 68 | Temp 98.6°F | Resp 17 | Wt 219.1 lb

## 2023-06-02 DIAGNOSIS — I2699 Other pulmonary embolism without acute cor pulmonale: Secondary | ICD-10-CM | POA: Diagnosis not present

## 2023-06-02 DIAGNOSIS — R0989 Other specified symptoms and signs involving the circulatory and respiratory systems: Secondary | ICD-10-CM

## 2023-06-02 DIAGNOSIS — Z86718 Personal history of other venous thrombosis and embolism: Secondary | ICD-10-CM | POA: Diagnosis not present

## 2023-06-02 DIAGNOSIS — Z7901 Long term (current) use of anticoagulants: Secondary | ICD-10-CM | POA: Diagnosis not present

## 2023-06-02 DIAGNOSIS — I82412 Acute embolism and thrombosis of left femoral vein: Secondary | ICD-10-CM

## 2023-06-02 DIAGNOSIS — I82402 Acute embolism and thrombosis of unspecified deep veins of left lower extremity: Secondary | ICD-10-CM | POA: Diagnosis not present

## 2023-06-02 LAB — CMP (CANCER CENTER ONLY)
ALT: 11 U/L (ref 0–44)
AST: 17 U/L (ref 15–41)
Albumin: 4.6 g/dL (ref 3.5–5.0)
Alkaline Phosphatase: 54 U/L (ref 38–126)
Anion gap: 9 (ref 5–15)
BUN: 12 mg/dL (ref 8–23)
CO2: 26 mmol/L (ref 22–32)
Calcium: 9.6 mg/dL (ref 8.9–10.3)
Chloride: 105 mmol/L (ref 98–111)
Creatinine: 0.81 mg/dL (ref 0.44–1.00)
GFR, Estimated: >60 mL/min (ref >60)
Glucose, Bld: 96 mg/dL (ref 70–99)
Potassium: 3.9 mmol/L (ref 3.5–5.1)
Sodium: 140 mmol/L (ref 135–145)
Total Bilirubin: 0.6 mg/dL (ref 0.0–1.2)
Total Protein: 6.9 g/dL (ref 6.5–8.1)

## 2023-06-02 LAB — CBC WITH DIFFERENTIAL (CANCER CENTER ONLY)
Abs Immature Granulocytes: 0.01 10*3/uL (ref 0.00–0.07)
Basophils Absolute: 0.1 10*3/uL (ref 0.0–0.1)
Basophils Relative: 1 %
Eosinophils Absolute: 0.1 10*3/uL (ref 0.0–0.5)
Eosinophils Relative: 2 %
HCT: 40.6 % (ref 36.0–46.0)
Hemoglobin: 13.8 g/dL (ref 12.0–15.0)
Immature Granulocytes: 0 %
Lymphocytes Relative: 22 %
Lymphs Abs: 1.3 10*3/uL (ref 0.7–4.0)
MCH: 31.5 pg (ref 26.0–34.0)
MCHC: 34 g/dL (ref 30.0–36.0)
MCV: 92.7 fL (ref 80.0–100.0)
Monocytes Absolute: 0.7 10*3/uL (ref 0.1–1.0)
Monocytes Relative: 12 %
Neutro Abs: 3.9 10*3/uL (ref 1.7–7.7)
Neutrophils Relative %: 63 %
Platelet Count: 264 10*3/uL (ref 150–400)
RBC: 4.38 MIL/uL (ref 3.87–5.11)
RDW: 13.1 % (ref 11.5–15.5)
WBC Count: 6.2 10*3/uL (ref 4.0–10.5)
nRBC: 0 % (ref 0.0–0.2)

## 2023-06-02 NOTE — Progress Notes (Unsigned)
 Hematology and Oncology Follow Up Visit  Kathryn Pineda [id number] February 17, 1955 68 y.o. 06/02/2023   Principle Diagnosis:  Recurrent thromboembolic disease-left leg/pulmonary embolism  Current Therapy:   Eliquis 5 mg p.o. twice daily     Interim History:  Kathryn Pineda is back for follow-up.    She did have a CT angiogram on 12/21/2022. This did not show any residual pulmonary embolism. 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 No sign of oncological origin on CT Chest/Abd/pelvis.   She is doing well on the Eliquis. She has had no chest wall pain.  There has been no cough.  She has had no nausea or vomiting.  She has had no rashes.  There has been no obvious change in bowel or bladder habits. Mild intermittent hemorrhoidal bleeding.   She has noticed some variation in her pulse rate recently. Between 60s and 90s at rest. No SOB, significant peripheral edema or chest pain. She does mention a few painful bumps she has gotten on her skin over the last week or so.   She has had no fever.  The been no problems with COVID.  We did do a complete hypercoagulable study on her.  Everything turned out fine without any obvious abnormal hypercoagulable issues.  Overall, I would say that her performance status is probably ECOG 1.   Medications:  Current Outpatient Medications:  .  acetaminophen (TYLENOL) 500 MG tablet, Take 500 mg by mouth every 6 (six) hours as needed for mild pain or moderate pain., Disp: , Rfl:  .  albuterol (VENTOLIN HFA) 108 (90 Base) MCG/ACT inhaler, Inhale 2 puffs into the lungs every 6 (six) hours as needed for wheezing or shortness of breath., Disp: 8 g, Rfl: 0 .  apixaban (ELIQUIS) 5 MG TABS tablet, Take 1 tablet (5 mg total) by mouth 2 (two) times daily., Disp: 180 tablet, Rfl: 3 .  Cholecalciferol (VITAMIN D3) 125 MCG (5000 UT) CAPS, Take 5,000 Units by mouth daily., Disp: , Rfl:  .  Cyanocobalamin (B-12 PO), Take 1 tablet by  mouth daily., Disp: , Rfl:  .  EPINEPHrine (EPIPEN 2-PAK) 0.3 mg/0.3 mL IJ SOAJ injection, Inject 0.3 mg into the muscle as needed for anaphylaxis., Disp: 2 each, Rfl: PRN .  estradiol (ESTRACE) 0.1 MG/GM vaginal cream, Place 0.5 g vaginally 2 (two) times a week. Place 0.5g nightly for two weeks then twice a week after, Disp: 30 g, Rfl: 3 .  lidocaine (XYLOCAINE) 5 % ointment, Use 0.5 g up to 3 times a day as needed for pain., Disp: 35.44 g, Rfl: 0 .  loratadine (CLARITIN) 10 MG tablet, Take 10 mg by mouth daily., Disp: , Rfl:  .  Multiple Vitamin (MULTIVITAMIN) tablet, Take 1 tablet by mouth daily., Disp: , Rfl:  .  Multiple Vitamins-Minerals (EMERGEN-C IMMUNE) PACK, Take 0.5 packets by mouth daily. As needed, Disp: , Rfl:  .  Vibegron (GEMTESA) 75 MG TABS, Take 1 tablet (75 mg total) by mouth daily., Disp: 30 tablet, Rfl: 2 .  loratadine (CLARITIN) 5 MG chewable tablet, Chew 5 mg by mouth daily. (Patient not taking: Reported on 06/02/2023), Disp: , Rfl:   Allergies:  Allergies  Allergen Reactions  . Penicillins Itching and Rash  . Adhesive [Tape] Other (See Comments)    Band-Aid causes blisters  . Paxlovid [Nirmatrelvir-Ritonavir] Other (See Comments)    Severe allergic reaction, throat closed up and caused blisters all around the mouth.  Past Medical History, Surgical history, Social history, and Family History were reviewed and updated.  Review of Systems: Review of Systems  Constitutional: Negative.   HENT:  Negative.    Eyes: Negative.   Respiratory: Negative.    Cardiovascular: Negative.   Gastrointestinal: Negative.   Endocrine: Negative.   Genitourinary: Negative.    Musculoskeletal: Negative.   Skin: Negative.   Neurological: Negative.   Hematological: Negative.   Psychiatric/Behavioral: Negative.      Physical Exam:  weight is 219 lb 1.9 oz (99.4 kg). Her oral temperature is 98.6 F (37 C). Her blood pressure is 116/55 (abnormal) and her pulse is 68. Her  respiration is 17 and oxygen saturation is 100%.   Wt Readings from Last 3 Encounters:  06/02/23 219 lb 1.9 oz (99.4 kg)  04/02/23 227 lb (103 kg)  02/03/23 223 lb (101.2 kg)    Physical Exam Vitals reviewed.  HENT:     Head: Normocephalic and atraumatic.  Eyes:     Pupils: Pupils are equal, round, and reactive to light.  Cardiovascular:     Rate and Rhythm: Normal rate and regular rhythm.     Heart sounds: Normal heart sounds.  Pulmonary:     Effort: Pulmonary effort is normal.     Breath sounds: Normal breath sounds.  Abdominal:     General: Bowel sounds are normal.     Palpations: Abdomen is soft.  Musculoskeletal:        General: No tenderness or deformity. Normal range of motion.     Cervical back: Normal range of motion.  Lymphadenopathy:     Cervical: No cervical adenopathy.  Skin:    General: Skin is warm and dry.     Findings: No bruising, erythema or rash.     Comments: One resolving pustule of right medial eyebrow and right hand.  Neurological:     Mental Status: She is alert and oriented to person, place, and time.  Psychiatric:        Behavior: Behavior normal.        Thought Content: Thought content normal.        Judgment: Judgment normal.     Lab Results  Component Value Date   WBC 6.2 06/02/2023   HGB 13.8 06/02/2023   HCT 40.6 06/02/2023   MCV 92.7 06/02/2023   PLT 264 06/02/2023     Chemistry      Component Value Date/Time   NA 140 06/02/2023 1453   K 3.9 06/02/2023 1453   CL 105 06/02/2023 1453   CO2 26 06/02/2023 1453   BUN 12 06/02/2023 1453   CREATININE 0.81 06/02/2023 1453   CREATININE 0.77 03/06/2016 1203      Component Value Date/Time   CALCIUM 9.6 06/02/2023 1453   ALKPHOS 54 06/02/2023 1453   AST 17 06/02/2023 1453   ALT 11 06/02/2023 1453   BILITOT 0.6 06/02/2023 1453     Encounter Diagnosis  Name Primary?  . Abnormal pulse Yes    Impression and Plan: Kathryn Pineda is a very nice 68 year old white female.  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 She is on lifelong anticoagulation secondary to recurrent thromboembolic disease. Fortunately she did have a reassuring hypercoagulable work up and no sign of oncological cause on CT chest/ABD Pelvis. She is currently on therapeutic dose of Eliquis 5 mg BID.   No recent VTE.  CBC stable CMP normal EKG shows *** At her next appointment we can discuss possibly reducing  to Eliquis 2.5 mg BID.   RTC 6 months APP, labs (CBC, CMP)  Alonza Arthurs [place], PA-C 4/16/20253:30 PM

## 2023-06-03 ENCOUNTER — Encounter: Payer: Medicare Other | Attending: Obstetrics | Admitting: Physical Therapy

## 2023-06-03 ENCOUNTER — Encounter: Payer: Self-pay | Admitting: Physical Therapy

## 2023-06-03 DIAGNOSIS — M6281 Muscle weakness (generalized): Secondary | ICD-10-CM | POA: Diagnosis not present

## 2023-06-03 DIAGNOSIS — R278 Other lack of coordination: Secondary | ICD-10-CM | POA: Insufficient documentation

## 2023-06-03 DIAGNOSIS — R102 Pelvic and perineal pain: Secondary | ICD-10-CM | POA: Insufficient documentation

## 2023-06-03 NOTE — Therapy (Signed)
 OUTPATIENT PHYSICAL THERAPY FEMALE PELVIC TREATMENT  Patient Name: Kathryn Pineda MRN: [id number] DOB:08/27/1955, 68 y.o., female Today's Date: 06/03/2023  END OF SESSION:  PT End of Session - 06/03/23 0941     Visit Number 5    Date for PT Re-Evaluation 06/24/23    Authorization Type [employer] medicare    Authorization - Visit Number 5    Authorization - Number of Visits 10    PT Start Time 0940    PT Stop Time 1020    PT Time Calculation (min) 40 min    Activity Tolerance Patient tolerated treatment well    Behavior During Therapy [facility] for tasks assessed/performed             Past Medical History:  Diagnosis Date   Anal fissure    Arthritis    Biliary colic 5/96   ERCP w/spincterotomy   Condyloma    Deep vein thrombosis (DVT) of lower extremity (HCC)    Fatigue    loss of sleep   Generalized headaches    GERD (gastroesophageal reflux disease)    Hemorrhoid    Hiatal hernia    Migraines    Obesity    Phlebitis of leg    PONV (postoperative nausea and vomiting)    Primary osteoarthritis of both knees    Stress incontinence    Wilson's disease    Past Surgical History:  Procedure Laterality Date   ABDOMINAL HYSTERECTOMY     ANAL FISSURECTOMY  1986   ANTERIOR AND POSTERIOR REPAIR N/A 11/24/2016   Procedure: ANTERIOR (CYSTOCELE) AND POSTERIOR REPAIR (RECTOCELE);  Surgeon: Erman Hayward, MD;  Location: [facility];  Service: Urology;  Laterality: N/A;   BUNIONECTOMY Bilateral 2005   CESAREAN SECTION  1990   prolapsed cord   CYSTOSCOPY N/A 11/24/2016   Procedure: CYSTOSCOPY;  Surgeon: Erman Hayward, MD;  Location: [facility];  Service: Urology;  Laterality: N/A;   ENDOVENOUS ABLATION SAPHENOUS VEIN W/ LASER  2008   both lower legs   LAPAROSCOPIC CHOLECYSTECTOMY  1997   LAPAROSCOPIC GASTRIC SLEEVE RESECTION WITH HIATAL HERNIA REPAIR  09/16/2010   LAPAROSCOPIC VAGINAL HYSTERECTOMY WITH SALPINGECTOMY Bilateral 11/24/2016   Procedure: LAPAROSCOPIC ASSISTED VAGINAL  HYSTERECTOMY WITH SALPINGO-OOPHORECTOMY;  Surgeon: Lillian Rein, MD;  Location: [facility];  Service: Gynecology;  Laterality: Bilateral;   LOWER EXTREMITY VENOGRAPHY  08/19/2022   Procedure: LOWER EXTREMITY VENOGRAPHY;  Surgeon: Kayla Part, MD;  Location: [facility] INVASIVE CV LAB;  Service: Cardiovascular;;   PERIPHERAL VASCULAR THROMBECTOMY N/A 08/19/2022   Procedure: PERIPHERAL VASCULAR THROMBECTOMY;  Surgeon: Kayla Part, MD;  Location: [facility] INVASIVE CV LAB;  Service: Cardiovascular;  Laterality: N/A;   PERIPHERAL VASCULAR ULTRASOUND/IVUS  08/19/2022   Procedure: Peripheral Vascular Ultrasound/IVUS;  Surgeon: Kayla Part, MD;  Location: [facility] INVASIVE CV LAB;  Service: Cardiovascular;;   Patient Active Problem List   Diagnosis Date Noted   Dyspareunia, female 05/06/2023   Urge urinary incontinence 02/03/2023   Abnormal urinalysis 02/03/2023   Vaginal atrophy 02/03/2023   Fecal incontinence 02/03/2023   Pulmonary emboli (HCC) 08/16/2022   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   Acute deep vein thrombosis (DVT) of femoral vein of left lower extremity (HCC) 08/16/2022   Internal hemorrhoids 07/24/2022   Thrombophlebitis of superficial veins of lower extremity 07/24/2022   History of DVT (deep vein thrombosis) 07/24/2022   Viral URI 04/10/2022   History of colonic polyps 07/15/2021   Rectocele 11/13/2016   Pelvic organ prolapse quantification stage 2  cystocele 11/13/2016   Bronchitis with bronchospasm 09/17/2014   Seasonal allergies 09/17/2014   Obesity (BMI 30-39.9) 12/24/2010   Hx of laparoscopic gastric banding 09/23/2010    PCP: Copland, Skipper Dumas, MD  REFERRING PROVIDER: Darlene Ehlers, MD   REFERRING DIAG:  N81.10 (ICD-10-CM) - Pelvic organ prolapse quantification stage 2 cystocele  N39.41 (ICD-10-CM) - Urge urinary incontinence  R15.9 (ICD-10-CM) - Incontinence of feces, unspecified fecal incontinence type    THERAPY DIAG:  Muscle weakness  (generalized)  Other lack of coordination  Pelvic pain  Rationale for Evaluation and Treatment: Rehabilitation  ONSET DATE: 2021  SUBJECTIVE:                                                                                                                                                                                           SUBJECTIVE STATEMENT: I am doing the same. I had a hemorrhoid bleeding episode. I had the blood work on my clots and everything is great. I do not have pain with using the estrogen cream. No stool leakage at this time. The urine will come out when I do the breathing.    PAIN:  Are you having pain? Yes NPRS scale: 3/10 06/03/23: pain level is 0/10 Pain location:  vaginal area  Pain type: deep pressure cramp Pain description: intermittent   Aggravating factors: cough Relieving factors: not coughing  PRECAUTIONS: None  RED FLAGS: None   WEIGHT BEARING RESTRICTIONS: No  FALLS:  Has patient fallen in last 6 months? No  OCCUPATION: congregational nurse  ACTIVITY LEVEL : teach  and hour of chair yoga and wt. Lifting, walking  PLOF: Independent  PATIENT GOALS: improve pelvic floor strength,   PERTINENT HISTORY:  Abdominal Hysterectomy; Anal fissurectomy; anterior and posterior repair 20 18; cesarean section; Cholecystectomy; laparoscopic gastric sleeve resection with hiatal hernia repair;   BOWEL MOVEMENT: Pain with bowel movement: No Type of bowel movement:Type (Bristol Stool Scale) Type 6 and 7, Frequency daily and several times per day, Strain Yes, and Splinting no rarely strains Fully empty rectum: Yes: sometimes but may have to come back to the toilet several times Leakage: Yes: had stool leakage in the past 2 weeks with this virus, stool leakage with coughing Pads: Yes: 1 main pad with liner, changes the liner 1 time Fiber supplement/laative Yes: metamucil mixed with benefiber  URINATION: Pain with urination: No Fully empty bladder: Yes:  80% of the time, after bowel movement may have to urinate Stream: Strong Urgency: Yes: a little bit, if she bends and move too much Frequency: average Leakage:  has the urge to void then bends over Pads: Yes: see  above  INTERCOURSE:  Ability to have vaginal penetration Yes.   Pain with intercourse: Initial Penetration and During Penetration, felt very dry 06/03/23: no pain  DrynessYesusing estrogen cream Climax: yes Marinoff Scale: 1/3  PREGNANCY: Vaginal deliveries 3 Tearing Yes: possible grade 3 Episiotomy No C-section deliveries 1 Currently pregnant No   OBJECTIVE:  Note: Objective measures were completed at Evaluation unless otherwise noted.  DIAGNOSTIC FINDINGS:  none    COGNITION: Overall cognitive status: Within functional limits for tasks assessed     SENSATION: Light touch: Appears intact   POSTURE: decreased lumbar lordosis and posterior pelvic tilt   LUMBARAROM/PROM:  A/PROM A/PROM  eval  Flexion Full with tightness in lumbar  Extension Decreased by 25%  Right lateral flexion Decreased by 25%  Left lateral flexion Decreased by 25%  Right rotation Decreased by 25%  Left rotation Decreased by 25%   (Blank rows = not tested)  LOWER EXTREMITY ROM: bilateral hip ROM is full  Passive ROM Right eval Left eval  Hip flexion 4/5 4/5  Hip abduction 4/5 4/5   (Blank rows = not tested)  LOWER EXTREMITY MMT:  MMT Right eval Left eval  Hip flexion 4/5 4/5  Hip abduction 4/5 4/5   (Blank rows = not tested) PALPATION:   Pelvic Alignment: ASIS are equal  Abdominal: tenderness located in the lower abdomen, along the left upper abdominal, decreased lower rib cage mobility; scar in lower abdominal area is restricted; can tighten the abdominals well                External Perineal Exam: tenderness located on left ischiocavernosus and perineal body                             Internal Pelvic Floor: tenderness located on the iliococcygeus, rectum has  hemorrhoids and looks aggravated with a cauliflower look posteriorly  Patient confirms identification and approves PT to assess internal pelvic floor and treatment Yes  PELVIC MMT:   MMT eval  Vaginal 2/5 no hug of finger  Internal Anal Sphincter 0/5  External Anal Sphincter 0/5  Puborectalis 0/5  (Blank rows = not tested)        TONE: increased  PROLAPSE: Anterior wall  TODAY'S TREATMENT:  06/03/23 Manual: Soft tissue mobilization: Manual work to the abdomen with circular massage to reduce restrictions Manual work to the diaphragm to improve mobility  Scar tissue mobilization: Reviewed how to do scar massage Exercises: Strengthening: Reviewed HEP  and patient understands them and is doing them at home Discussed with patient on how she is incorporating her pelvic floor exercises into her regular exercise routine by contract the pelvic floor with her core Therapeutic activities: Functional strengthening activities: Reviewed how diaphragmatic breathing is helping with her urination and bowel movements Self-care: Reviewed on how her estrogen cream is helping her vaginal area and the positive effects she is seeing        04/22/23 Manual: Soft tissue mobilization: Manual work to the diaphragm to increase mobility, manual work to the obliques and all over the abdomen to improve mobility of the tissue Scar tissue mobilization: Myofascial release: Fascial release of the mesenteric root, suprapubically and right side of the abdomen Neuromuscular re-education: Down training: Sitting and stretch the diaphragm to prepare for diaphragmatic breathing Sitting working on diaphragmatic breathing to relax the pelvic floor.  Therapeutic activities: Functional strengthening activities: Educated patient on how to breath with having a bowel movement with  first using diaphragmatic breathing, then forceful air out to push the stool out and feet on the stool. Patient had trouble with relaxing  the anus when pushing air out. She needed more verbal cues to not raise the chest instead have the abdomen fill up with air.   04/15/23 Manual: Internal pelvic floor techniques: No emotional/communication barriers or cognitive limitation. Patient is motivated to learn. Patient understands and agrees with treatment goals and plan. PT explains patient will be examined in standing, sitting, and lying down to see how their muscles and joints work. When they are ready, they will be asked to remove their underwear so PT can examine their perineum. The patient is also given the option of providing their own chaperone as one is not provided in our facility. The patient also has the right and is explained the right to defer or refuse any part of the evaluation or treatment including the internal exam. With the patient's consent, PT will use one gloved finger to gently assess the muscles of the pelvic floor, seeing how well it contracts and relaxes and if there is muscle symmetry. After, the patient will get dressed and PT and patient will discuss exam findings and plan of care. PT and patient discuss plan of care, schedule, attendance policy and HEP activities.  Manual work outside the rectum due to increased tissue coming out that may be a rectal prolapse and the tissue was a very dark purple red color and looked irritated.  Working on the perineal body, ischiocavernosus, superior transverse perineum, levator ani and external anal sphincter Educated patient on how to use her new tool the Kiwi around the areas to work on lengthening the tissue Neuromuscular re-education: Pelvic floor contraction training: She is laying on her left side with therapist giving tactile cues outside the rectum to contract the anus and lift it while not contracting the gluteals. Patient does best if she contracts slowly to isolate the muscles. She responds to the verbal cue to lift her bladder and feel the coccyx bone come forward.   Rectal contraction with pressing hand into mat to engage the abdominals while contracting the pelvic floor.  ESelf-care: Discussed with patient on not trying the pelvic floor exercises on the commode due to the tissue will be spread apart. How she is to use her preparation H on the rectum due to the tissue is red and swollen.                                                                                                                               PATIENT EDUCATION:  06/03/23 Education details: educated patient on the [place] to reduce the depth of the penis in the vagina, cream for rectum to reduce the inflammation of the hemorrhoids and the kiwi to use for tissue manipulation around the vaginal area and pelvic floor musclesAccess Code: [email or web address] Person educated: Patient Education method: Explanation, Demonstration, Tactile cues, Verbal cues, and Handouts Education  comprehension: verbalized understanding, returned demonstration, verbal cues required, tactile cues required, and needs further education  HOME EXERCISE PROGRAM: 06/03/23 Access Code: [email or web address] URL: [email or web address] Date: 04/06/2023 Prepared by: Eulis Foster  Exercises - Supine Figure 4 Piriformis Stretch  - 1 x daily - 7 x weekly - 1 sets - 1 reps - 30 sec hold - Supine Diaphragmatic Breathing  - 1 x daily - 7 x weekly - 1 sets - 10 reps - Cat Cow  - 1 x daily - 7 x weekly - 1 sets - 10 reps - Quadruped Rocking Slow  - 1 x daily - 7 x weekly - 1 sets - 10 reps  ASSESSMENT:  CLINICAL IMPRESSION: Patient is a 68 y.o. female who was seen today for physical therapy  treatment for cystocele, fecal incontinence, and urge incontinence. Patient leakage for urine has not happened since last visit. She is not leaking the discharge out her rectum anymore. She is doing her doing her pelvic floor and core contraction during her exercise classes.   Patient is able to relax her pelvic floor with urination and bowel movement  so she is not straining. Therapist did not assess the pelvic floor strength due to her not having leakage.   OBJECTIVE IMPAIRMENTS: decreased coordination, decreased strength, increased fascial restrictions, increased muscle spasms, and pain.   ACTIVITY LIMITATIONS: bending, continence, and toileting  PARTICIPATION LIMITATIONS: interpersonal relationship and community activity  PERSONAL FACTORS: Age, Fitness, and 3+ comorbidities: Abdominal Hysterectomy; Anal fissurectomy; anterior and posterior repair 20 18; cesarean section; Cholecystectomy; laparoscopic gastric sleeve resection with hiatal hernia repair;   are also affecting patient's functional outcome.   REHAB POTENTIAL: Excellent  CLINICAL DECISION MAKING: Evolving/moderate complexity  EVALUATION COMPLEXITY: Moderate   GOALS: Goals reviewed with patient? Yes  SHORT TERM GOALS: Target date: 05/30/23  Patient independent with initial HEP for flexibility exercises.  Baseline: Goal status: Met 04/22/23  2.  Patient educated on scar massage.  Baseline:  Goal status: Met 04/22/23  3.  Patient educated on devices to assist with pelvic pain to relax the tissue.  Baseline:  Goal status: Met 04/15/23  4.  Patient educated on daily vaginal moisturizers to improve the health of the tissue.  Baseline:  Goal status: Met 04/22/23   LONG TERM GOALS: Target date: 06/24/23  Patient is independent with advanced HEP for pelvic floor and hip strength.  Baseline:  Goal status: Met 06/03/23  2.  Patient is able to perform diaphragmatic breathing to bulge the pelvic floor to be able to push stool out without straining.  Baseline:  Goal status: Met 06/03/23  3.  Patient pelvic floor strength is >/= 3/5 with hug of therapist finger to reduce her urinary leakage >/= 75%.  Baseline:  Goal status: Met 06/03/23  4.  Patient rectal strength is >/= 3/5 to reduce her stool leakage >/= 75%.  Baseline:  Goal status: Met 06/03/23  5.  Patient reports  her pain with coughing has decreased due to the ability to contract the pelvic floor.  Baseline:  Goal status: Met 06/03/23  6.  Patient is able to have penile penetration vaginally with reduction of pain due to improving the health of her tissue and reduction of trigger points in the pelvic floor.  Baseline:  Goal status: Met 06/03/23  PLAN: Discharge patient to HEP this visit.    Eulis Foster, PT 06/03/23 9:42 AM  PHYSICAL THERAPY DISCHARGE SUMMARY  Visits from Start of Care: 5  Current functional level related to goals /  functional outcomes: See above.    Remaining deficits: See above.    Education / Equipment: HEP   Patient agrees to discharge. Patient goals were met. Patient is being discharged due to meeting the stated rehab goals. Thank you for the referral.   Marsha Skeen, PT 06/03/23 10:43 AM'

## 2023-06-10 ENCOUNTER — Encounter: Payer: Medicare Other | Admitting: Physical Therapy

## 2023-06-14 ENCOUNTER — Other Ambulatory Visit (HOSPITAL_BASED_OUTPATIENT_CLINIC_OR_DEPARTMENT_OTHER): Payer: Self-pay

## 2023-06-14 ENCOUNTER — Other Ambulatory Visit: Payer: Self-pay | Admitting: Obstetrics

## 2023-06-14 DIAGNOSIS — N3941 Urge incontinence: Secondary | ICD-10-CM

## 2023-06-17 ENCOUNTER — Other Ambulatory Visit: Payer: Self-pay

## 2023-06-17 ENCOUNTER — Other Ambulatory Visit (HOSPITAL_BASED_OUTPATIENT_CLINIC_OR_DEPARTMENT_OTHER): Payer: Self-pay

## 2023-06-17 MED ORDER — GEMTESA 75 MG PO TABS
75.0000 mg | ORAL_TABLET | Freq: Every day | ORAL | 2 refills | Status: DC
  Filled 2023-06-17: qty 30, 30d supply, fill #0
  Filled 2023-07-23: qty 30, 30d supply, fill #1
  Filled 2023-08-19: qty 30, 30d supply, fill #2
  Filled ????-??-??: fill #2

## 2023-07-23 ENCOUNTER — Encounter: Payer: Self-pay | Admitting: Physician Assistant

## 2023-07-23 ENCOUNTER — Ambulatory Visit: Admitting: Physician Assistant

## 2023-07-23 ENCOUNTER — Other Ambulatory Visit (HOSPITAL_BASED_OUTPATIENT_CLINIC_OR_DEPARTMENT_OTHER): Payer: Self-pay

## 2023-07-23 VITALS — BP 103/69 | HR 70 | Ht 67.2 in | Wt 217.4 lb

## 2023-07-23 DIAGNOSIS — S63256A Unspecified dislocation of right little finger, initial encounter: Secondary | ICD-10-CM | POA: Diagnosis not present

## 2023-07-23 DIAGNOSIS — S2002XA Contusion of left breast, initial encounter: Secondary | ICD-10-CM

## 2023-07-23 DIAGNOSIS — W19XXXA Unspecified fall, initial encounter: Secondary | ICD-10-CM | POA: Diagnosis not present

## 2023-07-23 NOTE — Progress Notes (Addendum)
 Established patient visit   Patient: Kathryn Pineda   DOB: 09-29-1955   68 y.o. Female  MRN: [id number] Visit Date: 07/23/2023  Today's healthcare provider: Trenton Frock, PA-C   Cc. Fall, finger injury  Subjective    Discussed the use of AI scribe software for clinical note transcription with the patient, who gave verbal consent to proceed.  History of Present Illness   Kathryn Pineda is a 68 year old female on Eliquis  who presents with injuries following a fall.  She fell while sightseeing in [place] 5/28, resulting in facial cuts and significant epistaxis lasting two days. An MRI showed no brain bleeds, pt on Eliquis . She experiences 'brain fog' attributed to jet lag, with no nausea, vomiting, or headaches.  Her right hand, particularly the pinky, was injured. The pinky was suspected dislocated, manipulated back into place, and feels loose without taping. Taping alleviates pain and provides stability. X-rays showed no fractures. Denies numbness or color change aside from bruising  She has a bruise on her left breast that developed into a large, hard mass, described as a 'multicolored mess'. It is improving, with no discharge or bleeding.      Medications: Outpatient Medications Prior to Visit  Medication Sig   acetaminophen  (TYLENOL ) 500 MG tablet Take 500 mg by mouth every 6 (six) hours as needed for mild pain or moderate pain.   albuterol  (VENTOLIN  HFA) 108 (90 Base) MCG/ACT inhaler Inhale 2 puffs into the lungs every 6 (six) hours as needed for wheezing or shortness of breath.   apixaban  (ELIQUIS ) 5 MG TABS tablet Take 1 tablet (5 mg total) by mouth 2 (two) times daily.   Cholecalciferol (VITAMIN D3) 125 MCG (5000 UT) CAPS Take 5,000 Units by mouth daily.   Cyanocobalamin (B-12 PO) Take 1 tablet by mouth daily.   EPINEPHrine  (EPIPEN  2-PAK) 0.3 mg/0.3 mL IJ SOAJ injection Inject 0.3 mg into the muscle as needed for anaphylaxis.   estradiol  (ESTRACE ) 0.1 MG/GM vaginal cream  Place 0.5 g vaginally 2 (two) times a week. Place 0.5g nightly for two weeks then twice a week after   loratadine  (CLARITIN ) 10 MG tablet Take 10 mg by mouth daily.   Multiple Vitamin (MULTIVITAMIN) tablet Take 1 tablet by mouth daily.   Multiple Vitamins-Minerals (EMERGEN-C IMMUNE) PACK Take 0.5 packets by mouth daily. As needed   Vibegron  (GEMTESA ) 75 MG TABS Take 1 tablet (75 mg total) by mouth daily.   [DISCONTINUED] lidocaine  (XYLOCAINE ) 5 % ointment Use 0.5 g up to 3 times a day as needed for pain.   No facility-administered medications prior to visit.    Review of Systems  Constitutional:  Negative for fatigue and fever.  Respiratory:  Negative for cough and shortness of breath.   Cardiovascular:  Negative for chest pain and leg swelling.  Gastrointestinal:  Negative for abdominal pain.  Musculoskeletal:  Positive for arthralgias.  Skin:  Positive for color change.  Neurological:  Negative for dizziness and headaches.       Objective    BP 103/69   Pulse 70   Ht 5' 7.2" (1.707 m)   Wt 217 lb 6.4 oz (98.6 kg)   BMI 33.85 kg/m    Physical Exam Vitals reviewed.  Constitutional:      Appearance: She is not ill-appearing.  HENT:     Head: Normocephalic.  Eyes:     Extraocular Movements: Extraocular movements intact.     Conjunctiva/sclera: Conjunctivae normal.     Pupils:  Pupils are equal, round, and reactive to light.  Cardiovascular:     Rate and Rhythm: Normal rate.  Pulmonary:     Effort: Pulmonary effort is normal. No respiratory distress.  Chest:     Comments: L breast lower inner quadrant with large ecchymosis, some nodularity to the tissue Musculoskeletal:     Comments: Resolving eccchymosis to right 3rth 4th and 5th MCP. Some mild tenderness along medial hand and 5th mtp.   Neurological:     Mental Status: She is alert and oriented to person, place, and time.  Psychiatric:        Mood and Affect: Mood normal.        Behavior: Behavior normal.       No results found for any visits on 07/23/23.  Assessment & Plan    Dislocation of right little finger, initial encounter Full ROM today minimal tenderness and resolving ecchymosis . Keep buddy taped for support over the next 1-2 weeks.  If pain/instability persists would recommend repeat imaging to ensure no occult fx  Traumatic ecchymosis of left female breast, initial encounter Will resolve, reviewed hematoma vs fat necrosis. If bruising resolves but nodularity still present would get US  to confirm fat necrosis  Fall, initial encounter Possible concussion but asymptomatic, brain fog complicated by travel. Rest, hydrate.    Return if symptoms worsen or fail to improve.       Trenton Frock, PA-C  [facility] [phone] (phone) [phone] (fax)  [facility]

## 2023-08-02 ENCOUNTER — Ambulatory Visit (HOSPITAL_BASED_OUTPATIENT_CLINIC_OR_DEPARTMENT_OTHER): Payer: 59 | Admitting: Obstetrics & Gynecology

## 2023-08-08 NOTE — Patient Instructions (Incomplete)
It was good to see you again today, I will be in touch with your blood work

## 2023-08-08 NOTE — Progress Notes (Addendum)
 [facility] [address] [place], [place] [address] [phone]  Date:  08/12/2023   Name:  Kathryn Pineda   DOB:  [birth year]   MRN:  [id number]  PCP:  Watt Harlene BROCKS, MD    Chief Complaint: No chief complaint on file.   History of Present Illness:  Kathryn Pineda is a 68 y.o. very pleasant female patient who presents with the following:  Patient seen today for periodic follow-up.  Most recent visit with myself was in October for her welcome to Medicare visit  History of DVT/PE treated with Xarelto  for 3 months in 2020 then had recurrence, she is anticoagulated now permanently, cystocele, lap band surgery, prediabetes She has 3 adult children, her daughter who is her youngest is in medical school at [employer] Her 2 sons are older and out of school  She injured her right hand about 3 weeks ago.  She was in [place] and had a fall- it sounds like she dislocated her 5th MCP joint and had it reduced in [place].  They did x-rays for her and she reports these were negative at the time.  However she continues to have pain and some swelling over the fifth metacarpal.  The pinky finger seems relatively normal  She was seen by hematology most recently in April for history of DVT/PE: Kathryn Pineda is a very nice 68 year old white female.  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 She is on lifelong anticoagulation secondary to recurrent thromboembolic disease. Fortunately she did have a reassuring hypercoagulable work up and no sign of oncological cause on CT chest/ABD Pelvis. She is currently on therapeutic dose of Eliquis  5 mg BID.  No recent VTE.  CBC stable CMP normal EKG shows normal sinus rhythm  At her next appointment we can discuss possibly reducing to Eliquis  2.5 mg BID.  RTC 6 months APP, labs (CBC, CMP)  She is also seen by urogynecology, most recent follow-up in March: Kathryn Pineda is a 68 y.o. female seen in  follow-up for stage II pelvic organ prolapse, urgency urinary incontinence, fecal incontinence, and vaginal atrophy. Plan at last visit was referral for pelvic floor PT, gemtesa  and vaginal estrogen. See assessment and plan dated 05/06/2023  Mammogram up-to-date Colon cancer screening up-to-date DEXA scan up-to-date-normal She has completed Shingrix  Pneumonia vaccines up-to-date She had one dose of hep A years ago- she would like to do the 2nd dose now -they have a lot of travel coming up and she would like to make sure she is protected  Eliquis  5 twice daily EpiPen  Vaginal estrogen Gemtasa for overactive bladder Patient Active Problem List   Diagnosis Date Noted   Dyspareunia, female 05/06/2023   Urge urinary incontinence 02/03/2023   Abnormal urinalysis 02/03/2023   Vaginal atrophy 02/03/2023   Fecal incontinence 02/03/2023   Pulmonary emboli (HCC) 08/16/2022   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   Acute deep vein thrombosis (DVT) of femoral vein of left lower extremity (HCC) 08/16/2022   Internal hemorrhoids 07/24/2022   Thrombophlebitis of superficial veins of lower extremity 07/24/2022   History of DVT (deep vein thrombosis) 07/24/2022   Viral URI 04/10/2022   History of colonic polyps 07/15/2021   Rectocele 11/13/2016   Pelvic organ prolapse quantification stage 2 cystocele 11/13/2016   Bronchitis with bronchospasm 09/17/2014   Seasonal allergies 09/17/2014   Obesity (BMI 30-39.9) 12/24/2010   Hx of laparoscopic  gastric banding 09/23/2010    Past Medical History:  Diagnosis Date   Anal fissure    Arthritis    Biliary colic 5/96   ERCP w/spincterotomy   Condyloma    Deep vein thrombosis (DVT) of lower extremity (HCC)    Fatigue    loss of sleep   Generalized headaches    GERD (gastroesophageal reflux disease)    Hemorrhoid    Hiatal hernia    Migraines    Obesity    Phlebitis of leg    PONV (postoperative nausea and vomiting)     Primary osteoarthritis of both knees    Stress incontinence    Wilson's disease     Past Surgical History:  Procedure Laterality Date   ABDOMINAL HYSTERECTOMY     ANAL FISSURECTOMY  1986   ANTERIOR AND POSTERIOR REPAIR N/A 11/24/2016   Procedure: ANTERIOR (CYSTOCELE) AND POSTERIOR REPAIR (RECTOCELE);  Surgeon: Gaston Hamilton, MD;  Location: [facility];  Service: Urology;  Laterality: N/A;   BUNIONECTOMY Bilateral 2005   CESAREAN SECTION  1990   prolapsed cord   CYSTOSCOPY N/A 11/24/2016   Procedure: CYSTOSCOPY;  Surgeon: Gaston Hamilton, MD;  Location: [facility];  Service: Urology;  Laterality: N/A;   ENDOVENOUS ABLATION SAPHENOUS VEIN W/ LASER  2008   both lower legs   LAPAROSCOPIC CHOLECYSTECTOMY  1997   LAPAROSCOPIC GASTRIC SLEEVE RESECTION WITH HIATAL HERNIA REPAIR  09/16/2010   LAPAROSCOPIC VAGINAL HYSTERECTOMY WITH SALPINGECTOMY Bilateral 11/24/2016   Procedure: LAPAROSCOPIC ASSISTED VAGINAL HYSTERECTOMY WITH SALPINGO-OOPHORECTOMY;  Surgeon: Cleotilde Ronal RAMAN, MD;  Location: [facility];  Service: Gynecology;  Laterality: Bilateral;   LOWER EXTREMITY VENOGRAPHY  08/19/2022   Procedure: LOWER EXTREMITY VENOGRAPHY;  Surgeon: Lanis Fonda BRAVO, MD;  Location: [facility] INVASIVE CV LAB;  Service: Cardiovascular;;   PERIPHERAL VASCULAR THROMBECTOMY N/A 08/19/2022   Procedure: PERIPHERAL VASCULAR THROMBECTOMY;  Surgeon: Lanis Fonda BRAVO, MD;  Location: [facility]VAS[facility] CV LAB;  Service: Cardiovascular;  Laterality: N/A;   PERIPHERAL VASCULAR ULTRASOUND/IVUS  08/19/2022   Procedure: Peripheral Vascular Ultrasound/IVUS;  Surgeon: Lanis Fonda BRAVO, MD;  Location: [facility] INVASIVE CV LAB;  Service: Cardiovascular;;    Social History   Tobacco Use   Smoking status: Never   Smokeless tobacco: Never  Vaping Use   Vaping status: Never Used  Substance Use Topics   Alcohol use: Yes    Alcohol/week: 1.0 standard drink of alcohol    Types: 1 Glasses of wine per week   Drug use: No    Family History  Problem  Relation Age of Onset   Osteoarthritis Mother    Diabetes Father    Colon polyps Father    Hypertension Brother    Cancer Maternal Grandmother        unaware/ unknown primary source   Cancer Maternal Uncle        leukemia   Bladder Cancer Neg Hx    Uterine cancer Neg Hx     Allergies  Allergen Reactions   Penicillins Itching and Rash   Adhesive [Tape] Other (See Comments)    Band-Aid causes blisters   Paxlovid  [Nirmatrelvir -Ritonavir ] Other (See Comments)    Severe allergic reaction, throat closed up and caused blisters all around the mouth.     Medication list has been reviewed and updated.  Current Outpatient Medications on File Prior to Visit  Medication Sig Dispense Refill   acetaminophen  (TYLENOL ) 500 MG tablet Take 500 mg by mouth every 6 (six) hours as needed for mild pain or moderate pain.  albuterol  (VENTOLIN  HFA) 108 (90 Base) MCG/ACT inhaler Inhale 2 puffs into the lungs every 6 (six) hours as needed for wheezing or shortness of breath. 8 g 0   apixaban  (ELIQUIS ) 5 MG TABS tablet Take 1 tablet (5 mg total) by mouth 2 (two) times daily. 180 tablet 3   Cholecalciferol (VITAMIN D3) 125 MCG (5000 UT) CAPS Take 5,000 Units by mouth daily.     Cyanocobalamin (B-12 PO) Take 1 tablet by mouth daily.     EPINEPHrine  (EPIPEN  2-PAK) 0.3 mg/0.3 mL IJ SOAJ injection Inject 0.3 mg into the muscle as needed for anaphylaxis. 2 each PRN   estradiol  (ESTRACE ) 0.1 MG/GM vaginal cream Place 0.5 g vaginally 2 (two) times a week. Place 0.5g nightly for two weeks then twice a week after 30 g 3   loratadine  (CLARITIN ) 10 MG tablet Take 10 mg by mouth daily.     Multiple Vitamin (MULTIVITAMIN) tablet Take 1 tablet by mouth daily.     Multiple Vitamins-Minerals (EMERGEN-C IMMUNE) PACK Take 0.5 packets by mouth daily. As needed     Vibegron  (GEMTESA ) 75 MG TABS Take 1 tablet (75 mg total) by mouth daily. 30 tablet 2   No current facility-administered medications on file prior to visit.     Review of Systems:  As per HPI- otherwise negative.   Physical Examination: Vitals:   08/12/23 0853  BP: 110/73  Pulse: 63  Resp: 16  Temp: 98.6 F (37 C)  SpO2: 99%   Vitals:   08/12/23 0853  Weight: 216 lb (98 kg)  Height: 5' 8.5 (1.74 m)   Body mass index is 32.36 kg/m. Ideal Body Weight: Weight in (lb) to have BMI = 25: 166.5  GEN: no acute distress.  Mild obesity, looks well  HEENT: Atraumatic, Normocephalic.  Ears and Nose: No external deformity. CV: RRR, No M/G/R. No JVD. No thrill. No extra heart sounds. PULM: CTA B, no wheezes, crackles, rhonchi. No retractions. No resp. distress. No accessory muscle use. ABD: S, NT, ND EXTR: No c/c/e PSYCH: Normally interactive. Conversant.  Right hand: There is minimal bruising and mild swelling over the fifth metacarpal Flexion extension strength of the pinky finger is normal, sensation of the finger is normal.  The fifth metacarpal is also tender to palpation  Assessment and Plan: Thyroid  disorder screening - Plan: TSH  Screening for diabetes mellitus - Plan: Hemoglobin A1c  B12 deficiency - Plan: Vitamin B12  Screening, lipid - Plan: Lipid panel  Vitamin D  deficiency - Plan: VITAMIN D  25 Hydroxy (Vit-D Deficiency, Fractures)  Immunization due - Plan: Hepatitis A vaccine adult IM  Dislocation of right little finger, sequela - Plan: DG Hand Complete Right, Ambulatory referral to Hand Surgery  Need for hepatitis A vaccination  Patient seen today for follow-up. She is compliant with her anticoagulation for recurrent DVT Routine lab work is pending as above Gave her second dose of hepatitis A vaccine She had a dislocation but not a fracture of her right fifth MCP joint about 3 weeks ago.  Her hand is still sore.  Will repeat x-ray today to look for any occult fracture and placed a referral to hand surgery  Signed Harlene Schroeder, MD  Received her x-ray, message to patient  DG Hand Complete Right Result  Date: 08/12/2023 CLINICAL DATA:  Reported small finger dislocation weeks ago. Pain in small and ring fingers radiating to the wrist. EXAM: RIGHT HAND - COMPLETE 3+ VIEW COMPARISON:  None Available. FINDINGS: Small lucent lesion laterally in  the hamate, probably a geode, less likely a small benign enchondroma. No current well-defined fracture or malalignment. No acute bony findings. No specific radiographic sequela of dislocation. IMPRESSION: 1. No acute findings. 2. Small lucent lesion laterally in the hamate, probably a geode, less likely a small benign enchondroma. Electronically Signed   By: Ryan Salvage M.D.   On: 08/12/2023 11:02   Addendum 6/27, received labs as below.  Message to patient Results for orders placed or performed in visit on 08/12/23  Vitamin B12   Collection Time: 08/12/23  9:36 AM  Result Value Ref Range   Vitamin B-12 809 211 - 911 pg/mL  Hemoglobin A1c   Collection Time: 08/12/23  9:36 AM  Result Value Ref Range   Hgb A1c MFr Bld 5.8 4.6 - 6.5 %  Lipid panel   Collection Time: 08/12/23  9:36 AM  Result Value Ref Range   Cholesterol 228 (H) 0 - 200 mg/dL   Triglycerides 34.9 0.0 - 149.0 mg/dL   HDL 21.09 >60.99 mg/dL   VLDL 86.9 0.0 - 59.9 mg/dL   LDL Cholesterol 863 (H) 0 - 99 mg/dL   Total CHOL/HDL Ratio 3    NonHDL 149.04   TSH   Collection Time: 08/12/23  9:36 AM  Result Value Ref Range   TSH 1.41 0.35 - 5.50 uIU/mL  VITAMIN D  25 Hydroxy (Vit-D Deficiency, Fractures)   Collection Time: 08/12/23  9:36 AM  Result Value Ref Range   VITD 39.99 30.00 - 100.00 ng/mL

## 2023-08-12 ENCOUNTER — Encounter: Payer: Self-pay | Admitting: Family Medicine

## 2023-08-12 ENCOUNTER — Ambulatory Visit (HOSPITAL_BASED_OUTPATIENT_CLINIC_OR_DEPARTMENT_OTHER)
Admission: RE | Admit: 2023-08-12 | Discharge: 2023-08-12 | Disposition: A | Discharge status: Home / Self Care | Source: Ambulatory Visit | Attending: Family Medicine | Admitting: Family Medicine

## 2023-08-12 ENCOUNTER — Ambulatory Visit (INDEPENDENT_AMBULATORY_CARE_PROVIDER_SITE_OTHER): Admitting: Family Medicine

## 2023-08-12 ENCOUNTER — Other Ambulatory Visit (HOSPITAL_BASED_OUTPATIENT_CLINIC_OR_DEPARTMENT_OTHER): Payer: Self-pay

## 2023-08-12 VITALS — BP 110/73 | HR 63 | Temp 98.6°F | Resp 16 | Ht 68.5 in | Wt 216.0 lb

## 2023-08-12 DIAGNOSIS — Z13220 Encounter for screening for lipoid disorders: Secondary | ICD-10-CM

## 2023-08-12 DIAGNOSIS — S63256S Unspecified dislocation of right little finger, sequela: Secondary | ICD-10-CM | POA: Diagnosis not present

## 2023-08-12 DIAGNOSIS — Z131 Encounter for screening for diabetes mellitus: Secondary | ICD-10-CM

## 2023-08-12 DIAGNOSIS — Z1329 Encounter for screening for other suspected endocrine disorder: Secondary | ICD-10-CM | POA: Diagnosis not present

## 2023-08-12 DIAGNOSIS — E559 Vitamin D deficiency, unspecified: Secondary | ICD-10-CM | POA: Diagnosis not present

## 2023-08-12 DIAGNOSIS — Z23 Encounter for immunization: Secondary | ICD-10-CM | POA: Diagnosis not present

## 2023-08-12 DIAGNOSIS — E538 Deficiency of other specified B group vitamins: Secondary | ICD-10-CM | POA: Diagnosis not present

## 2023-08-12 DIAGNOSIS — M79641 Pain in right hand: Secondary | ICD-10-CM | POA: Diagnosis not present

## 2023-08-12 LAB — VITAMIN B12: Vitamin B-12: 809 pg/mL (ref 211–911)

## 2023-08-12 LAB — HEMOGLOBIN A1C: Hgb A1c MFr Bld: 5.8 % (ref 4.6–6.5)

## 2023-08-12 LAB — LIPID PANEL
Cholesterol: 228 mg/dL — ABNORMAL HIGH (ref 0–200)
HDL: 78.9 mg/dL (ref >39.00)
LDL Cholesterol: 136 mg/dL — ABNORMAL HIGH (ref 0–99)
NonHDL: 149.04
Total CHOL/HDL Ratio: 3
Triglycerides: 65 mg/dL (ref 0.0–149.0)
VLDL: 13 mg/dL (ref 0.0–40.0)

## 2023-08-12 LAB — TSH: TSH: 1.41 u[IU]/mL (ref 0.35–5.50)

## 2023-08-12 LAB — VITAMIN D 25 HYDROXY (VIT D DEFICIENCY, FRACTURES): VITD: 39.99 ng/mL (ref 30.00–100.00)

## 2023-08-12 MED ORDER — COMIRNATY 30 MCG/0.3ML IM SUSY
0.3000 mL | PREFILLED_SYRINGE | Freq: Once | INTRAMUSCULAR | 0 refills | Status: AC
  Filled 2023-08-12: qty 0.3, 1d supply, fill #0

## 2023-08-13 ENCOUNTER — Encounter: Payer: Self-pay | Admitting: Family Medicine

## 2023-08-19 ENCOUNTER — Ambulatory Visit (HOSPITAL_BASED_OUTPATIENT_CLINIC_OR_DEPARTMENT_OTHER): Admitting: Obstetrics & Gynecology

## 2023-08-19 ENCOUNTER — Other Ambulatory Visit (HOSPITAL_BASED_OUTPATIENT_CLINIC_OR_DEPARTMENT_OTHER): Payer: Self-pay

## 2023-08-19 ENCOUNTER — Encounter (HOSPITAL_BASED_OUTPATIENT_CLINIC_OR_DEPARTMENT_OTHER): Payer: Self-pay | Admitting: Obstetrics & Gynecology

## 2023-08-19 VITALS — BP 115/50 | HR 65 | Wt 211.0 lb

## 2023-08-19 DIAGNOSIS — N941 Unspecified dyspareunia: Secondary | ICD-10-CM

## 2023-08-19 DIAGNOSIS — Z86718 Personal history of other venous thrombosis and embolism: Secondary | ICD-10-CM | POA: Diagnosis not present

## 2023-08-19 DIAGNOSIS — I2699 Other pulmonary embolism without acute cor pulmonale: Secondary | ICD-10-CM

## 2023-08-19 DIAGNOSIS — Z01419 Encounter for gynecological examination (general) (routine) without abnormal findings: Secondary | ICD-10-CM

## 2023-08-19 DIAGNOSIS — Z23 Encounter for immunization: Secondary | ICD-10-CM

## 2023-08-19 DIAGNOSIS — Z90710 Acquired absence of both cervix and uterus: Secondary | ICD-10-CM

## 2023-08-19 NOTE — Progress Notes (Signed)
   Breast and Pelvic exam Patient name: Kathryn Pineda MRN [id number]  Date of birth: 1955/10/16 Chief Complaint:   Breast and Pelvic  History of Present Illness:   Kathryn Pineda is a 68 y.o. [id number] Caucasian female being seen today for breast and pelvic exam.  Denies vaginal bleeding.  Had extensive DVT/PE last year.  Was hospitalized.  Now on eliquis  5mg  bid.  Has been to [place] this year.  Leaving for [place] this weekend.  Using vaginal estradiol  cream.  This has helped.  On Gemtesa .  This has helped urinary incontinence significantly.   No LMP recorded. Patient has had a hysterectomy.  Last pap:  hx of hysterectomy Last mammogram: 03/05/2023. Results were: normal.  Last colonoscopy: 10/31/2020. F/u 5 years. DEXA:  2024, normal.        07/24/2022    8:51 AM 07/15/2021   10:28 AM 05/14/2021    3:54 PM 07/11/2020   12:00 PM 09/17/2014   10:21 AM  Depression screen PHQ 2/9  Decreased Interest 0 0 0 0   Down, Depressed, Hopeless 0 0 0 0   PHQ - 2 Score 0 0 0 0      Information is confidential and restricted. Go to Review Flowsheets to unlock data.    Review of Systems:   Pertinent items are noted in HPI Denies pelvic pain.  Mild urinary incontinence.   Pertinent History Reviewed:  Reviewed past medical,surgical, social and family history.  Reviewed problem list, medications and allergies. Physical Assessment:   Vitals:   08/19/23 1504  BP: (!) 115/50  Pulse: 65  SpO2: 100%  Weight: 211 lb (95.7 kg)  Body mass index is 31.62 kg/m.        Physical Examination:   General appearance - well appearing, and in no distress  Mental status - alert, oriented to person, place, and time  Psych:  She has a normal mood and affect  Skin - warm and dry, normal color, no suspicious lesions noted  Chest - effort normal, all lung fields clear to auscultation bilaterally  Heart - normal rate and regular rhythm  Neck:  midline trachea, no thyromegaly or nodules  Breasts - breasts appear  normal, no suspicious masses, no skin or nipple changes or  axillary nodes  Abdomen - soft, nontender, nondistended, no masses or organomegaly  Pelvic - VULVA: normal appearing vulva with no masses, tenderness or lesions   VAGINA: normal appearing vagina with normal color and discharge, no lesions   CERVIX: surgically absent  Thin prep pap not indicated  UTERUS: uterus is felt to be normal size, shape, consistency and nontender   ADNEXA: No adnexal masses or tenderness noted.  Rectal - deferred  Extremities:  No swelling or varicosities noted  Chaperone present for exam  Assessment & Plan:  1. Encntr for gyn exam (general) (routine) w/o abn findings (Primary) - Pap smear not indicated - Mammogram 02/2023 - Colonoscopy 2022.  Follow up 5 years. - Bone mineral density 03/2022 - lab work done with PCP, Dr. Watt - vaccines reviewed/updated  2. H/O: hysterectomy  3. History of DVT (deep vein thrombosis) - on eliquis  5mg  bid  4. Other acute pulmonary embolism without acute cor pulmonale (HCC)  5. Dyspareunia, female - on vaginal estrogen managed by Dr. Guadlupe   Meds: No orders of the defined types were placed in this encounter.   Follow-up: Return in about 1 year (around 08/18/2024).  Kathryn GORMAN Pinal, MD 08/19/2023 3:58 PM

## 2023-09-06 ENCOUNTER — Ambulatory Visit: Admitting: Orthopedic Surgery

## 2023-09-06 DIAGNOSIS — M79642 Pain in left hand: Secondary | ICD-10-CM | POA: Diagnosis not present

## 2023-09-06 NOTE — Progress Notes (Signed)
 Kathryn Pineda - 68 y.o. female MRN [id number]  Date of birth: 07-03-1955  Office Visit Note: Visit Date: 09/06/2023 PCP: Watt Harlene BROCKS, MD Referred by: Watt Harlene BROCKS, MD  Subjective: No chief complaint on file.  HPI: Kathryn Pineda is a pleasant 68 y.o. female who presents today for evaluation of ongoing right small finger pain along the ulnar aspect with associated weakness.  She describes a fall approximate 2 months prior while [place] with an associated MP dislocation of the small finger.  She was seen in a local emergency department setting and underwent closed reduction at the time.  X-rays were negative at that time as well.  She was seen subsequently by her family practice doctor who obtained new x-rays which were also read as negative.  Since that time, she has maintained appropriate range of motion of the hand, does have some ongoing soreness along the volar aspect of the small finger with associated weakness.  Pertinent ROS were reviewed with the patient and found to be negative unless otherwise specified above in HPI.   Visit Reason:right little finger pain-no strength and pain into wrist-no numbness Duration of symptoms:had a fall at the end of may Hand dominance: right Occupation: Retired Environmental manager  Diabetic: No Smoking: No Heart/Lung History:Hx of clots in legs Blood Thinners: Elliquis  Prior Testing/EMG:xrays  Injections (Date):none Treatments:none Prior Surgery:none  Assessment & Plan: Visit Diagnoses:  1. Pain in left hand     Plan: Based upon her clinical history and workup, she sustained a dislocation of the small finger MP region without associated fracture and underwent appropriate close reduction.  X-rays after injury have been negative.  Her range of motion is appropriate today.  There is no significant rotational abnormality, normal cascade of the digits is visualized.  I have recommended that she do some occupational therapy for  strengthening exercises of the right hand, focus will be on the PIP and DIP regions of the small finger.  She can continue with buddy taping as needed in the near future.  She is welcome to return to me as needed moving forward.  I spent 30 minutes in the care of this patient today including review of previous documentation, imaging obtained, face-to-face time discussing all options regarding treatment and documenting the encounter.   Follow-up: No follow-ups on file.   Meds & Orders: No orders of the defined types were placed in this encounter.   Orders Placed This Encounter  Procedures   Ambulatory referral to Occupational Therapy     Procedures: No procedures performed      Clinical History: No specialty comments available.  She reports that she has never smoked. She has never used smokeless tobacco.  Recent Labs    08/12/23 0936  HGBA1C 5.8    Objective:   Vital Signs: There were no vitals taken for this visit.  Physical Exam  Gen: Well-appearing, in no acute distress; non-toxic CV: Regular Rate. Well-perfused. Warm.  Resp: Breathing unlabored on room air; no wheezing. Psych: Fluid speech in conversation; appropriate affect; normal thought process  Ortho Exam Right hand: - Mild tenderness over the small finger A1 region - Able to form composite fist today, no rotational abnormality, no digital overlap, appropriate cascade to the digits - Sensation intact in all distributions distally, AIN/PIN/interosseous intact - Range of motion of the small finger MP 0-90 PIP 0-80, DIP 0-45 - No significant clicking or locking appreciated of the small finger  Imaging: No results found.  Past Medical/Family/Surgical/Social History: Medications & Allergies reviewed per EMR, new medications updated. 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   Acute deep vein thrombosis (DVT) of femoral vein of left lower extremity (HCC) 08/16/2022   Internal hemorrhoids 07/24/2022   Thrombophlebitis of superficial veins of lower extremity 07/24/2022   History of DVT (deep vein thrombosis) 07/24/2022   Viral URI 04/10/2022   History of colonic polyps 07/15/2021   Rectocele 11/13/2016   Pelvic organ prolapse quantification stage 2 cystocele 11/13/2016   Bronchitis with bronchospasm 09/17/2014   Seasonal allergies 09/17/2014   Obesity (BMI 30-39.9) 12/24/2010   Hx of laparoscopic gastric banding 09/23/2010   Past Medical History:  Diagnosis Date   Anal fissure    Arthritis    Biliary colic 5/96   ERCP w/spincterotomy   Condyloma    Deep vein thrombosis (DVT) of lower extremity (HCC)    Fatigue    loss of sleep   Generalized headaches    GERD (gastroesophageal reflux disease)    Hemorrhoid    Hiatal hernia    Migraines    Obesity    Phlebitis of leg    PONV (postoperative nausea and vomiting)    Primary osteoarthritis of both knees    Stress incontinence    Wilson's disease    Family History  Problem Relation Age of Onset   Osteoarthritis Mother    Diabetes Father    Colon polyps Father    Hypertension Brother    Cancer Maternal Grandmother        unaware/ unknown primary source   Cancer Maternal Uncle        leukemia   Bladder Cancer Neg Hx    Uterine cancer Neg Hx    Past Surgical History:  Procedure Laterality Date   ABDOMINAL HYSTERECTOMY     ANAL FISSURECTOMY  1986   ANTERIOR AND POSTERIOR REPAIR N/A 11/24/2016   Procedure: ANTERIOR (CYSTOCELE) AND POSTERIOR REPAIR (RECTOCELE);  Surgeon: Gaston Hamilton, MD;  Location: [facility];  Service: Urology;  Laterality: N/A;   BUNIONECTOMY Bilateral 2005   CESAREAN SECTION  1990   prolapsed cord   CYSTOSCOPY N/A 11/24/2016    Procedure: CYSTOSCOPY;  Surgeon: Gaston Hamilton, MD;  Location: [facility];  Service: Urology;  Laterality: N/A;   ENDOVENOUS ABLATION SAPHENOUS VEIN W/ LASER  2008   both lower legs   LAPAROSCOPIC CHOLECYSTECTOMY  1997   LAPAROSCOPIC GASTRIC SLEEVE RESECTION WITH HIATAL HERNIA REPAIR  09/16/2010   LAPAROSCOPIC VAGINAL HYSTERECTOMY WITH SALPINGECTOMY Bilateral 11/24/2016   Procedure: LAPAROSCOPIC ASSISTED VAGINAL HYSTERECTOMY WITH SALPINGO-OOPHORECTOMY;  Surgeon: Cleotilde Ronal RAMAN, MD;  Location: [facility];  Service: Gynecology;  Laterality: Bilateral;   LOWER EXTREMITY VENOGRAPHY  08/19/2022   Procedure: LOWER EXTREMITY VENOGRAPHY;  Surgeon: Lanis Fonda BRAVO, MD;  Location: [facility] INVASIVE CV LAB;  Service: Cardiovascular;;   PERIPHERAL VASCULAR THROMBECTOMY N/A 08/19/2022   Procedure: PERIPHERAL VASCULAR THROMBECTOMY;  Surgeon: Lanis Fonda BRAVO, MD;  Location: [facility] INVASIVE CV LAB;  Service: Cardiovascular;  Laterality: N/A;   PERIPHERAL VASCULAR ULTRASOUND/IVUS  08/19/2022   Procedure: Peripheral Vascular Ultrasound/IVUS;  Surgeon: Lanis Fonda BRAVO, MD;  Location: [facility] INVASIVE CV LAB;  Service: Cardiovascular;;   Social History   Occupational History   Not on  file  Tobacco Use   Smoking status: Never   Smokeless tobacco: Never  Vaping Use   Vaping status: Never Used  Substance and Sexual Activity   Alcohol use: Yes    Alcohol/week: 1.0 standard drink of alcohol    Types: 1 Glasses of wine per week   Drug use: No   Sexual activity: Yes    Partners: Male    Birth control/protection: Other-see comments, Surgical    Comment: LAV[facility] 11/24/16    Haynes Giannotti Estela) Arlinda, M.D. [facility], Hand Surgery

## 2023-09-16 NOTE — Therapy (Signed)
 OUTPATIENT OCCUPATIONAL THERAPY ORTHO EVALUATION  Patient Name: Kathryn Pineda MRN: [id number] DOB:[birth year], 68 y.o., female Today's Date: 09/21/2023  PCP: Watt ALF MD REFERRING PROVIDER: Dr. Arlinda   END OF SESSION:  OT End of Session - 09/20/23 1153     Visit Number 1    Number of Visits 5    Date for OT Re-Evaluation 10/22/23    Authorization Type [employer]    OT Start Time 1153    OT Stop Time 1239    OT Time Calculation (min) 46 min    Equipment Utilized During Treatment Orthotic materials    Activity Tolerance Patient tolerated treatment well;No increased pain;Patient limited by pain    Behavior During Therapy [facility] for tasks assessed/performed          Past Medical History:  Diagnosis Date   Anal fissure    Arthritis    Biliary colic 5/96   ERCP w/spincterotomy   Condyloma    Deep vein thrombosis (DVT) of lower extremity (HCC)    Fatigue    loss of sleep   Generalized headaches    GERD (gastroesophageal reflux disease)    Hemorrhoid    Hiatal hernia    Migraines    Obesity    Phlebitis of leg    PONV (postoperative nausea and vomiting)    Primary osteoarthritis of both knees    Stress incontinence    Wilson's disease    Past Surgical History:  Procedure Laterality Date   ABDOMINAL HYSTERECTOMY     ANAL FISSURECTOMY  1986   ANTERIOR AND POSTERIOR REPAIR N/A 11/24/2016   Procedure: ANTERIOR (CYSTOCELE) AND POSTERIOR REPAIR (RECTOCELE);  Surgeon: Gaston Hamilton, MD;  Location: [facility];  Service: Urology;  Laterality: N/A;   BUNIONECTOMY Bilateral 2005   CESAREAN SECTION  1990   prolapsed cord   CYSTOSCOPY N/A 11/24/2016   Procedure: CYSTOSCOPY;  Surgeon: Gaston Hamilton, MD;  Location: [facility];  Service: Urology;  Laterality: N/A;   ENDOVENOUS ABLATION SAPHENOUS VEIN W/ LASER  2008   both lower legs   LAPAROSCOPIC CHOLECYSTECTOMY  1997   LAPAROSCOPIC GASTRIC SLEEVE RESECTION WITH HIATAL HERNIA REPAIR  09/16/2010   LAPAROSCOPIC VAGINAL HYSTERECTOMY  WITH SALPINGECTOMY Bilateral 11/24/2016   Procedure: LAPAROSCOPIC ASSISTED VAGINAL HYSTERECTOMY WITH SALPINGO-OOPHORECTOMY;  Surgeon: Cleotilde Ronal RAMAN, MD;  Location: [facility];  Service: Gynecology;  Laterality: Bilateral;   LOWER EXTREMITY VENOGRAPHY  08/19/2022   Procedure: LOWER EXTREMITY VENOGRAPHY;  Surgeon: Lanis Fonda BRAVO, MD;  Location[facility] INV[facility] CV LAB;  Service: Cardiovascular;;   PERIPHERAL VASCULAR THROMBECTOMY N/A 08/19/2022   Procedure: PERIPHERAL VASCULAR THROMBECTOMY;  Surgeon: Lanis Fonda BRAVO, MD;  Location: [facility] INVASIVE CV LAB;  Service: Cardiovascular;  Laterality: N/A;   PERIPHERAL VASCULAR ULTRASOUND/IVUS  08/19/2022   Procedure: Peripheral Vascular Ultrasound/IVUS;  Surgeon: Lanis Fonda BRAVO, MD;  Location: [facility] INVASIVE CV LAB;  Service: Cardiovascular;;   Patient Active Problem List   Diagnosis Date Noted   Dyspareunia, female 05/06/2023   Urge urinary incontinence 02/03/2023   Vaginal atrophy 02/03/2023   Fecal incontinence 02/03/2023   Pulmonary emboli (HCC) 08/16/2022   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   Acute deep vein thrombosis (DVT) of femoral vein of left lower extremity (HCC) 08/16/2022   Internal hemorrhoids 07/24/2022   Thrombophlebitis of superficial veins of lower extremity 07/24/2022   History of DVT (deep vein thrombosis) 07/24/2022   Viral URI 04/10/2022   History of colonic polyps 07/15/2021   Rectocele 11/13/2016   Pelvic organ  prolapse quantification stage 2 cystocele 11/13/2016   Bronchitis with bronchospasm 09/17/2014   Seasonal allergies 09/17/2014   Obesity (BMI 30-39.9) 12/24/2010   Hx of laparoscopic gastric banding 09/23/2010    ONSET DATE: end of May 2025 fall  REFERRING DIAG: M79.642 (ICD-10-CM) - Pain in left hand   THERAPY DIAG:  Muscle weakness (generalized)  Other lack of coordination  Stiffness of left hand, not elsewhere classified  Pain in right hand  Stiffness of right hand, not elsewhere  classified  Rationale for Evaluation and Treatment: Rehabilitation  SUBJECTIVE:   SUBJECTIVE STATEMENT: She states falling while on a trip in [place] and ever since she has had pain in base of Rt SF metacarpal and she feels like she has no strength and soreness that lingers from hand to elbow ...  Also wants to work on now chronically stiff Lt RF PIPJ    PERTINENT HISTORY: Per MD notes: she sustained a dislocation of the small finger MP region without associated fracture and underwent appropriate close reduction. X-rays after injury have been negative. Her range of motion is appropriate today. There is no significant rotational abnormality, normal cascade of the digits is visualized. I have recommended that she do some occupational therapy for strengthening exercises of the right hand, focus will be on the PIP and DIP regions of the small finger.    PRECAUTIONS: None  RED FLAGS: None   WEIGHT BEARING RESTRICTIONS: No  PAIN:  Are you having pain? Yes: NPRS scale: 0-1/10 at rest, up to 3-4/10 in past week at worst Pain location: Right hand small finger metacarpal area Pain description: Mildly aching and sometimes more sore Aggravating factors: Gripping Relieving factors: Rest  FALLS: Has patient fallen in last 6 months? Yes. Number of falls 1 -while on a vacation in [place], this was an accident and she is not considered a fall risk  PLOF: Independent  PATIENT GOALS: To improve pain in the right hand and also flexion contracture in the left hand  NEXT MD VISIT: As needed   OBJECTIVE: (All objective assessments below are from initial evaluation on: 09/20/23 unless otherwise specified.)   HAND DOMINANCE: Right   ADLs: Overall ADLs: States decreased ability to grab, hold household objects, pain and difficulty to open containers, perform FMS tasks    FUNCTIONAL OUTCOME MEASURES: Eval: Patient Specific Functional Scale: TBD-patient provides difficult activities but does not provide  rating at evaluation (Pushing up from floor, writing, grasping objects)  (Higher Score  =  Better Ability for the Selected Tasks)     UPPER EXTREMITY ROM     Shoulder to Wrist AROM Rt eval  Shoulder flexion   Shoulder abduction   Shoulder extension   Shoulder internal rotation   Shoulder external rotation   Elbow flexion   Elbow extension   Forearm supination   Forearm pronation    Wrist flexion 67  Wrist extension 63  Wrist ulnar deviation   Wrist radial deviation   Functional dart thrower's motion (F-DTM) in ulnar flexion   F-DTM in radial extension    (Blank rows = not tested)   Hand AROM Right eval Left eval  Full Fist Ability (or Gap to Distal Palmar Crease) Makes a full fist Makes a full fist  Thumb Opposition  (Kapandji Scale)  Within functional limits Within functional limits  Ring MCP (0-90)      Ring PIP (0-100)   (-20) - 90   Ring DIP (0-70)      Little MCP (0-90) 0-  89    Little PIP (0-100) 0- 92    Little DIP (0-70) 0- 74    (Blank rows = not tested)   HAND FUNCTION: Eval: TBD-this will be checked in upcoming sessions, she does state pain with gripping with the right hand so this could potentially be weak and limiting. Grip strength Right: TBD lbs, Left: TBD lbs   COORDINATION: Eval: No overt or observed coordination impairments noted today  SENSATION: Eval:  Light touch intact today  EDEMA:   Eval: None significant today  COGNITION: Eval: Overall cognitive status: WFL for evaluation today   OBSERVATIONS:   Eval: In the right hand: hook of hamate area has some soreness and just distal to this, wrist extension creates type of clicking near the [facility] joint of the fifth finger, left hand ring finger PIP joint has an overt, mild flexion contracture.    TODAY'S TREATMENT:  Post-evaluation treatment:   She was given the following home exercise program to work on both right hand pain and stiffness as well as left ring finger stiffness.  A custom  orthosis was also indicated to help with the now chronic PIP joint flexion contracture of the left hand.  At the end of the session she states understanding all directions and leaves without pain  Exercises - Wrist Flexion Stretch  - 4 x daily - 3-5 reps - 15 sec hold - Wrist Prayer Stretch  - 4 x daily - 3-5 reps - 15 sec hold - BACK KNUCKLE STRETCHES   - 4 x daily - 3-5 reps - 15 sec hold - HOOK Stretch  - 4 x daily - 3-5 reps - 15-20 sec hold - Pencil Pushup  - 4-6 x daily - 1 sets - 10-15 reps - PUSH KNUCKLES DOWN  - 4 x daily - 3-5 reps - 15 seconds hold  Custom orthotic fabrication was indicated due to pt's chronic left hand ring finger PIP flexion contracture and need for safe, functional positioning. OT fabricated custom hand-based PIP joint extension nighttime orthosis for pt today to help straighten her finger and correct deformity. It fit well with no areas of pressure, pt states a comfortable fit. Pt was educated on the wearing schedule (only at night), to avoid exposing it to sources of heat, to wipe clean as needed (do not wash, use harsh detergents), to call or come in ASAP if it is causing any irritation or is not achieving desired function. It will be checked/adjusted in upcoming sessions, as needed. Pt states understanding all directions.     PATIENT EDUCATION: Education details: See tx section above for details  Person educated: Patient Education method: Verbal Instruction, Teach back, Handouts  Education comprehension: States and demonstrates understanding, Additional Education required    HOME EXERCISE PROGRAM: Access Code: [email or web address] URL: [email or web address] Date: 09/20/2023 Prepared by: Melvenia Ada   GOALS: Goals reviewed with patient? Yes   SHORT TERM GOALS: (STG required if POC>30 days) Target Date: 10/08/23  Pt will obtain protective, custom orthotic. Goal status: 09/20/23: MET   2.  Pt will demo/state understanding of initial HEP to  improve pain levels and prerequisite motion. Goal status: INITIAL   LONG TERM GOALS: Target Date: 10/22/23  Pt will improve functional ability by decreased impairment per PSFS assessment to 8 or better, for better quality of life. Goal status: INITIAL  2.  Pt will improve grip strength in Rt hand to at least 40lbs for functional use at home and in IADLs. Goal status: INITIAL  3.  Pt will improve A/ROM in left ring finger PIP joint extension from (-20) to at least (-10), to have functional motion for tasks like reach and grasp.  Goal status: INITIAL  4.  Pt will decrease pain at worst from 3-4/10 to 1/10 or better to have better sleep and occupational participation in daily roles. Goal status: INITIAL   ASSESSMENT:  CLINICAL IMPRESSION: Patient is a 68 y.o. female who was seen today for occupational therapy evaluation for pain and reports of instability and decreased functional ability in the right hand small finger mainly after a fall.  She also reports stiffness and ongoing PIP joint contracture in the left hand ring finger.  The patient will benefit from outpatient occupational therapy to decrease symptoms, improve functional upper extremity use, and increase quality of life.  PERFORMANCE DEFICITS: in functional skills including IADLs, coordination, ROM, strength, pain, fascial restrictions, body mechanics, and UE functional use, cognitive skills including problem solving and safety awareness, and psychosocial skills including coping strategies, environmental adaptation, habits, and routines and behaviors.   IMPAIRMENTS: are limiting patient from ADLs, IADLs, rest and sleep, and leisure.   COMORBIDITIES: may have co-morbidities  that affects occupational performance. Patient will benefit from skilled OT to address above impairments and improve overall function.  MODIFICATION OR ASSISTANCE TO COMPLETE EVALUATION: No modification of tasks or assist necessary to complete an  evaluation.  OT OCCUPATIONAL PROFILE AND HISTORY: Problem focused assessment: Including review of records relating to presenting problem.  CLINICAL DECISION MAKING: Moderate - several treatment options, min-mod task modification necessary  REHAB POTENTIAL: Excellent  EVALUATION COMPLEXITY: Low      PLAN:  OT FREQUENCY: 1-2x/week  OT DURATION: 4 weeks through 10/22/23 and up to 5 total visits as needed   PLANNED INTERVENTIONS: 97535 self care/ADL training, 02889 therapeutic exercise, 97530 therapeutic activity, 97112 neuromuscular re-education, 97140 manual therapy, 97035 ultrasound, 97032 electrical stimulation (manual), 97760 Orthotic Initial, [id number] Orthotic/Prosthetic subsequent, compression bandaging, Dry needling, energy conservation, coping strategies training, and patient/family education  RECOMMENDED OTHER SERVICES: none now    CONSULTED AND AGREED WITH PLAN OF CARE: Patient  PLAN FOR NEXT SESSION:   Review initial HEP and recommendations, check orthosis, add putty activities as needed   Melvenia Ada, OTR/L, CHT  09/21/2023, 5:01 PM

## 2023-09-17 ENCOUNTER — Encounter: Payer: Self-pay | Admitting: Obstetrics

## 2023-09-17 ENCOUNTER — Ambulatory Visit: Admitting: Obstetrics

## 2023-09-17 VITALS — BP 121/74 | HR 66

## 2023-09-17 DIAGNOSIS — N941 Unspecified dyspareunia: Secondary | ICD-10-CM

## 2023-09-17 DIAGNOSIS — N952 Postmenopausal atrophic vaginitis: Secondary | ICD-10-CM

## 2023-09-17 DIAGNOSIS — R159 Full incontinence of feces: Secondary | ICD-10-CM | POA: Diagnosis not present

## 2023-09-17 DIAGNOSIS — N3941 Urge incontinence: Secondary | ICD-10-CM

## 2023-09-17 NOTE — Patient Instructions (Addendum)
 Consider reducing your probiotics use.   Continue to titrate your fiber supplementation  Continue pelvic floor physical therapy.   Continue gemtesa  for overactive bladder symptoms.   Try IBGuard to see if it reduces your sensation of inflammation in your GI tract.

## 2023-09-17 NOTE — Progress Notes (Signed)
 [facility] Return Visit  SUBJECTIVE  History of Present Illness: Kathryn Pineda is a 68 y.o. female seen in follow-up for stage II pelvic organ prolapse, urgency urinary incontinence, fecal incontinence, and vaginal atrophy. Plan at last visit was continue fiber supplementation, pelvic floor PT, gemtesa  and vaginal estrogen.   Reports recent travels to [place] and [place], returned 2 weeks ago Mostly bothered by fecal incontinence, improves within 3 days of anti-inflammatory diet when she returns home Using metamucil 1 tablespoon/day with Type IV to VII stool Reports improvement since resuming diet, reduced to moist line this past week.   Pelvic floor PT with relief of symptoms. Increased walking and exercises Prior wean off gemtesa , symptoms returned within 2 weeks.  Previously moisture in underwear unclear if it was urinary and fecal incontinence.  Moisture on pad 2-3x/month, associated with missing metamucil or dietary changes.  Prior resolution of UUI, now either 1-2x/month and more bothered by intermittent urgency prior to large bowel movement Vaginal estrogen 2x/week with relief of dryness and discomfort.  Reports decreased size of vaginal bulge.  Started dietary modification to reduce inflammation.  Reduced 1-2 cups of coffee Able to attempt intercourse without pain  Past Medical History: Patient  has a past medical history of Anal fissure, Arthritis, Biliary colic (5/96), Condyloma, Deep vein thrombosis (DVT) of lower extremity (HCC), Fatigue, Generalized headaches, GERD (gastroesophageal reflux disease), Hemorrhoid, Hiatal hernia, Migraines, Obesity, Phlebitis of leg, PONV (postoperative nausea and vomiting), Primary osteoarthritis of both knees, Stress incontinence, and Wilson's disease.   Past Surgical History: She  has a past surgical history that includes Anal fissurectomy (1986); Laparoscopic cholecystectomy (1997); Bunionectomy (Bilateral, 2005); Endovenous  ablation saphenous vein w/ laser (2008); Cesarean section (1990); Laparoscopic gastric sleeve resection with hiatal hernia repair (09/16/2010); Laparoscopic vaginal hysterectomy with salpingectomy (Bilateral, 11/24/2016); Cystoscopy (N/A, 11/24/2016); Anterior and posterior repair (N/A, 11/24/2016); Abdominal hysterectomy; PERIPHERAL VASCULAR THROMBECTOMY (N/A, 08/19/2022); LOWER EXTREMITY VENOGRAPHY (08/19/2022); and Peripheral Vascular Ultrasound/IVUS (08/19/2022).   Medications: She has a current medication list which includes the following prescription(s): acetaminophen , albuterol , apixaban , vitamin d3, cyanocobalamin , epinephrine , estradiol , loratadine , multivitamin, emergen-c immune, and gemtesa .   Allergies: Patient is allergic to paxlovid  [nirmatrelvir -ritonavir ], penicillins, and adhesive [tape].   Social History: Patient  reports that she has never smoked. She has never used smokeless tobacco. She reports current alcohol use of about 1.0 standard drink of alcohol per week. She reports that she does not use drugs.     OBJECTIVE     Physical Exam: Vitals:   09/17/23 0947  BP: 121/74  Pulse: 66   Gen: No apparent distress, A&O x 3.  Detailed Urogynecologic Evaluation:  Deferred.       ASSESSMENT AND PLAN    Ms. Berberian is a 68 y.o. with:  1. Dyspareunia, female   2. Incontinence of feces, unspecified fecal incontinence type   3. Urge urinary incontinence   4. Vaginal atrophy      Dyspareunia, female Assessment & Plan: - resumed attempts for intercourse without pain since starting vaginal estrogen - encouraged lubrication use, vaginal estrogen, and position change - Rx lidocaine  for use prior to intercourse if needed - encourage Onut use as needed to control depth of penetration - continue pelvic floor PT exercises - continue  low dose vaginal estrogen and lubrication use   Incontinence of feces, unspecified fecal incontinence type Assessment & Plan: - noted prior to  surgery, continuous seepage daily with 1/4 teaspoon size area on pad - reduced since dietary changes, fiber supplementation,  and pelvic floor - recently increased with travel and fluctuations in diet - pt reports previous concerns with fistula, dimple noted at the right vaginal apex with no stool noted in vaginal vault or extravasation with valsalva. Encouraged pt to consider tampon placement to assess source if she is concerned after vaginal estrogen and pelvic floor PT if refractory symptoms - Treatment options include anti-diarrhea medication (loperamide/ Imodium OTC or prescription lomotil), fiber supplements, physical therapy, and possible sacral neuromodulation or surgery.   - continue pelvic floor PT and Kegel exercises - trial of IBGuard due to food triggers reported, sample provided - reduce probiotic dose to reassess stool consistency - consider pyridium  pad test to assess moisture from UUI vs. FI    Urge urinary incontinence Assessment & Plan: - 02/03/23 POCT UA +heme, negative repeat lab UA, bladder scan 23mL - We reviewed management including behavioral therapy (decreasing bladder irritants, urge suppression strategies, timed voids, bladder retraining), physical therapy, medication; for refractory cases posterior tibial nerve stimulation, sacral neuromodulation, and intravesical botulinum toxin injection.  For anticholinergic medications, we discussed the potential side effects of anticholinergics including dry eyes, dry mouth, constipation, cognitive impairment and urinary retention. For Beta-3 agonist medication, we discussed the potential side effect of elevated blood pressure which is more likely to occur in individuals with uncontrolled hypertension. - symptoms improved with gemtesa , discontinued with return of symptoms. Patient can repeat trial with discontinuation and resume if desired if she attributes nasal congestion symptoms with Gemtesa    Vaginal atrophy Assessment &  Plan: - For symptomatic vaginal atrophy options include lubrication with a water -based lubricant, personal hygiene measures and barrier protection against wetness, and estrogen replacement in the form of vaginal cream, vaginal tablets, or a time-released vaginal ring.   - continue low dose vaginal estrogen due to relief - explained that history of DVT and PE is not a contraindication for vaginal estrogen, currently on Eliquis    Time spent: I spent 20 minutes dedicated to the care of this patient on the date of this encounter to include pre-visit review of records, face-to-face time with the patient discussing urgency urinary incontinence, fecal incontinence, dyspareunia, vaginal atrophy, stage II pelvic organ prolapse, and post visit documentation and ordering medication/ testing.   Lianne ONEIDA Gillis, MD

## 2023-09-17 NOTE — Assessment & Plan Note (Signed)
 For symptomatic vaginal atrophy options include lubrication with a water-based lubricant, personal hygiene measures and barrier protection against wetness, and estrogen replacement in the form of vaginal cream, vaginal tablets, or a time-released vaginal ring.   - continue low dose vaginal estrogen due to relief - explained that history of DVT and PE is not a contraindication for vaginal estrogen, currently on Eliquis

## 2023-09-17 NOTE — Assessment & Plan Note (Addendum)
-   noted prior to surgery, continuous seepage daily with 1/4 teaspoon size area on pad - reduced since dietary changes, fiber supplementation, and pelvic floor - recently increased with travel and fluctuations in diet - pt reports previous concerns with fistula, dimple noted at the right vaginal apex with no stool noted in vaginal vault or extravasation with valsalva. Encouraged pt to consider tampon placement to assess source if she is concerned after vaginal estrogen and pelvic floor PT if refractory symptoms - Treatment options include anti-diarrhea medication (loperamide/ Imodium OTC or prescription lomotil), fiber supplements, physical therapy, and possible sacral neuromodulation or surgery.   - continue pelvic floor PT and Kegel exercises - trial of IBGuard due to food triggers reported, sample provided - reduce probiotic dose to reassess stool consistency - consider pyridium  pad test to assess moisture from UUI vs. FI

## 2023-09-17 NOTE — Assessment & Plan Note (Signed)
-   02/03/23 POCT UA +heme, negative repeat lab UA, bladder scan 23mL - We reviewed management including behavioral therapy (decreasing bladder irritants, urge suppression strategies, timed voids, bladder retraining), physical therapy, medication; for refractory cases posterior tibial nerve stimulation, sacral neuromodulation, and intravesical botulinum toxin injection.  For anticholinergic medications, we discussed the potential side effects of anticholinergics including dry eyes, dry mouth, constipation, cognitive impairment and urinary retention. For Beta-3 agonist medication, we discussed the potential side effect of elevated blood pressure which is more likely to occur in individuals with uncontrolled hypertension. - symptoms improved with gemtesa , discontinued with return of symptoms. Patient can repeat trial with discontinuation and resume if desired if she attributes nasal congestion symptoms with Gemtesa

## 2023-09-17 NOTE — Assessment & Plan Note (Signed)
-   resumed attempts for intercourse without pain since starting vaginal estrogen - encouraged lubrication use, vaginal estrogen, and position change - Rx lidocaine  for use prior to intercourse if needed - encourage Onut use as needed to control depth of penetration - continue pelvic floor PT exercises - continue  low dose vaginal estrogen and lubrication use

## 2023-09-20 ENCOUNTER — Ambulatory Visit: Admitting: Rehabilitative and Restorative Service Providers"

## 2023-09-20 DIAGNOSIS — M6281 Muscle weakness (generalized): Secondary | ICD-10-CM | POA: Diagnosis not present

## 2023-09-20 DIAGNOSIS — M25641 Stiffness of right hand, not elsewhere classified: Secondary | ICD-10-CM

## 2023-09-20 DIAGNOSIS — M25642 Stiffness of left hand, not elsewhere classified: Secondary | ICD-10-CM

## 2023-09-20 DIAGNOSIS — R278 Other lack of coordination: Secondary | ICD-10-CM | POA: Diagnosis not present

## 2023-09-20 DIAGNOSIS — M79641 Pain in right hand: Secondary | ICD-10-CM | POA: Diagnosis not present

## 2023-09-21 ENCOUNTER — Encounter: Payer: Self-pay | Admitting: Rehabilitative and Restorative Service Providers"

## 2023-09-23 ENCOUNTER — Other Ambulatory Visit: Payer: Self-pay

## 2023-09-23 ENCOUNTER — Other Ambulatory Visit (HOSPITAL_BASED_OUTPATIENT_CLINIC_OR_DEPARTMENT_OTHER): Payer: Self-pay

## 2023-09-23 ENCOUNTER — Other Ambulatory Visit: Payer: Self-pay | Admitting: Obstetrics

## 2023-09-23 ENCOUNTER — Other Ambulatory Visit: Payer: Self-pay | Admitting: Family Medicine

## 2023-09-23 DIAGNOSIS — I82412 Acute embolism and thrombosis of left femoral vein: Secondary | ICD-10-CM

## 2023-09-23 DIAGNOSIS — N3941 Urge incontinence: Secondary | ICD-10-CM

## 2023-09-23 MED ORDER — GEMTESA 75 MG PO TABS
75.0000 mg | ORAL_TABLET | Freq: Every day | ORAL | 2 refills | Status: AC
  Filled 2023-09-23: qty 90, 90d supply, fill #0
  Filled 2024-01-10 (×2): qty 90, 90d supply, fill #1

## 2023-09-23 MED ORDER — APIXABAN 5 MG PO TABS
5.0000 mg | ORAL_TABLET | Freq: Two times a day (BID) | ORAL | 3 refills | Status: DC
  Filled 2023-09-23: qty 180, 90d supply, fill #0

## 2023-09-24 ENCOUNTER — Other Ambulatory Visit: Payer: Self-pay

## 2023-10-11 NOTE — Therapy (Signed)
 OUTPATIENT OCCUPATIONAL THERAPY TREATMENT NOTE  Patient Name: Kathryn Pineda MRN: [id number] DOB:12/28/1955, 68 y.o., female Today's Date: 10/12/2023  PCP: [facility] ALF MD REFERRING PROVIDER: Dr. Arlinda   END OF SESSION:  OT End of Session - 10/12/23 1108     Visit Number 2    Number of Visits 5    Date for OT Re-Evaluation 10/22/23    Authorization Type BCBS    OT Start Time 1108    OT Stop Time 1140    OT Time Calculation (min) 32 min    Activity Tolerance Patient tolerated treatment well;No increased pain    Behavior During Therapy WFL for tasks assessed/performed           Past Medical History:  Diagnosis Date   Anal fissure    Arthritis    Biliary colic 5/96   ERCP w/spincterotomy   Condyloma    Deep vein thrombosis (DVT) of lower extremity (HCC)    Fatigue    loss of sleep   Generalized headaches    GERD (gastroesophageal reflux disease)    Hemorrhoid    Hiatal hernia    Migraines    Obesity    Phlebitis of leg    PONV (postoperative nausea and vomiting)    Primary osteoarthritis of both knees    Stress incontinence    Wilson's disease    Past Surgical History:  Procedure Laterality Date   ABDOMINAL HYSTERECTOMY     ANAL FISSURECTOMY  1986   ANTERIOR AND POSTERIOR REPAIR N/A 11/24/2016   Procedure: ANTERIOR (CYSTOCELE) AND POSTERIOR REPAIR (RECTOCELE);  Surgeon: Gaston Hamilton, MD;  Location: [facility];  Service: Urology;  Laterality: N/A;   BUNIONECTOMY Bilateral 2005   CESAREAN SECTION  1990   prolapsed cord   CYSTOSCOPY N/A 11/24/2016   Procedure: CYSTOSCOPY;  Surgeon: Gaston Hamilton, MD;  Location: [facility];  Service: Urology;  Laterality: N/A;   ENDOVENOUS ABLATION SAPHENOUS VEIN W/ LASER  2008   both lower legs   LAPAROSCOPIC CHOLECYSTECTOMY  1997   LAPAROSCOPIC GASTRIC SLEEVE RESECTION WITH HIATAL HERNIA REPAIR  09/16/2010   LAPAROSCOPIC VAGINAL HYSTERECTOMY WITH SALPINGECTOMY Bilateral 11/24/2016   Procedure: LAPAROSCOPIC ASSISTED  VAGINAL HYSTERECTOMY WITH SALPINGO-OOPHORECTOMY;  Surgeon: Cleotilde Ronal RAMAN, MD;  Location: [facility];  Service: Gynecology;  Laterality: Bilateral;   LOWER EXTREMITY VENOGRAPHY  08/19/2022   Procedure: LOWER EXTREMITY VENOGRAPHY;  Surgeon: Lanis Fonda BRAVO, MD;  Location: [facility] INVASIVE CV LAB;  Service: Cardiovascular;;   PERIPHERAL VASCULAR THROMBECTOMY N/A 08/19/2022   Procedure: PERIPHERAL VASCULAR THROMBECTOMY;  Surgeon: Lanis Fonda BRAVO, MD;  Location: [facility] INVASIVE CV LAB;  Service: Cardiovascular;  Laterality: N/A;   PERIPHERAL VASCULAR ULTRASOUND/IVUS  08/19/2022   Procedure: Peripheral Vascular Ultrasound/IVUS;  Surgeon: Lanis Fonda BRAVO, MD;  Location: [facility] INVASIVE CV LAB;  Service: Cardiovascular;;   Patient Active Problem List   Diagnosis Date Noted   Dyspareunia, female 05/06/2023   Urge urinary incontinence 02/03/2023   Vaginal atrophy 02/03/2023   Fecal incontinence 02/03/2023   Pulmonary emboli (HCC) 08/16/2022   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   Acute deep vein thrombosis (DVT) of femoral vein of left lower extremity (HCC) 08/16/2022   Internal hemorrhoids 07/24/2022   Thrombophlebitis of superficial veins of lower extremity 07/24/2022   History of DVT (deep vein thrombosis) 07/24/2022   Viral URI 04/10/2022   History of colonic polyps 07/15/2021   Rectocele 11/13/2016   Pelvic organ prolapse quantification stage 2 cystocele 11/13/2016   Bronchitis with bronchospasm  09/17/2014   Seasonal allergies 09/17/2014   Obesity (BMI 30-39.9) 12/24/2010   Hx of laparoscopic gastric banding 09/23/2010    ONSET DATE: end of May 2025 fall  REFERRING DIAG: M79.642 (ICD-10-CM) - Pain in left hand   THERAPY DIAG:  Muscle weakness (generalized)  Other lack of coordination  Stiffness of left hand, not elsewhere classified  Pain in right hand  Stiffness of right hand, not elsewhere classified  Pain in joint of left hand  Rationale for Evaluation and  Treatment: Rehabilitation  PERTINENT HISTORY: Per MD notes: she sustained a dislocation of the small finger MP region without associated fracture and underwent appropriate close reduction. X-rays after injury have been negative. Her range of motion is appropriate today. There is no significant rotational abnormality, normal cascade of the digits is visualized. I have recommended that she do some occupational therapy for strengthening exercises of the right hand, focus will be on the PIP and DIP regions of the small finger.    PRECAUTIONS: None  RED FLAGS: None   WEIGHT BEARING RESTRICTIONS: No  SUBJECTIVE:   SUBJECTIVE STATEMENT: She states feeling no pain now, only when lifting weights, which she has returned to. She also states Lt hand RF is much straighter now, no pain there.     PAIN:  Are you having pain? Yes: NPRS scale: 0-1/10 at rest, up to  2/10 in past week at worst Pain location: Right hand small finger metacarpal area Pain description: Mildly aching and sometimes more sore Aggravating factors: Gripping Relieving factors: Rest  FALLS: Has patient fallen in last 6 months? Yes. Number of falls 1 -while on a vacation in [place], this was an accident and she is not considered a fall risk  PLOF: Independent  PATIENT GOALS: To improve pain in the right hand and also flexion contracture in the left hand  NEXT MD VISIT: As needed   OBJECTIVE: (All objective assessments below are from initial evaluation on: 09/20/23 unless otherwise specified.)   HAND DOMINANCE: Right   ADLs: Overall ADLs: States decreased ability to grab, hold household objects, pain and difficulty to open containers, perform FMS tasks    FUNCTIONAL OUTCOME MEASURES: EVAL: Patient Specific Functional Scale: TBD -patient provides difficult activities but does not provide rating at evaluation (Pushing up from floor, writing, grasping objects)  (Higher Score  =  Better Ability for the Selected Tasks)      UPPER EXTREMITY ROM     Shoulder to Wrist AROM Rt eval Rt 8.26.25  Shoulder flexion    Shoulder abduction    Shoulder extension    Shoulder internal rotation    Shoulder external rotation    Elbow flexion    Elbow extension    Forearm supination    Forearm pronation     Wrist flexion 67 69  Wrist extension 63 74  Wrist ulnar deviation    Wrist radial deviation    Functional dart thrower's motion (F-DTM) in ulnar flexion    F-DTM in radial extension     (Blank rows = not tested)   Hand AROM Right eval Left eval Lt 10/12/23  Full Fist Ability (or Gap to Distal Palmar Crease) Makes a full fist Makes a full fist   Thumb Opposition  (Kapandji Scale)  Within functional limits Within functional limits   Ring MCP (0-90)       Ring PIP (0-100)   (-20) - 90  (12) - 95  Ring DIP (0-70)       Little MCP (  0-90) 0- 89     Little PIP (0-100) 0- 92     Little DIP (0-70) 0- 74     (Blank rows = not tested)   HAND FUNCTION: 10/12/23:  Grip: Rt: 57#; Lt: 57#    Eval: TBD-this will be checked in upcoming sessions, she does state pain with gripping with the right hand so this could potentially be weak and limiting. Grip strength Right: TBD lbs, Left: TBD lbs    OBSERVATIONS:   Eval: In the right hand: hook of hamate area has some soreness and just distal to this, wrist extension creates type of clicking near the [facility] joint of the fifth finger, left hand ring finger PIP joint has an overt, mild flexion contracture.   Rt 5th MC pain; Lt RF PIP J contracture    TODAY'S TREATMENT:  10/12/23: She starts with active range active range of motion as well as gripping activities for new measures which show excellent improvements.  We review her home exercise program and OT adds new isometric strengthening as listed below.  She has no pain or problems with this and think she can manage this herself.  OT does adjust her orthosis for the left hand, creating new tension.  She states that that is  excellent.  We discussed that she may not need to return to therapy in the future if all those well, but she is leaving for vacation.  She was asked to cancel that appointment if not needed and OT will discharge this plan of care assuming all goals are met.     Exercises - Wrist Prayer Stretch  - 4 x daily - 3-5 reps - 15 sec hold - BACK KNUCKLE STRETCHES   - 4 x daily - 3-5 reps - 15 sec hold - Pencil Pushup  - 4-6 x daily - 1 sets - 10-15 reps - PUSH KNUCKLES DOWN  - 4 x daily - 3-5 reps - 15 seconds hold - Towel Roll Grip with Forearm in Neutral  - 2-3 x daily - 5 reps - 10 sec hold - Finger Spread  - 2-3 x daily - 5 reps - 5-10 hold - Cutting Putty  - 2-3 x daily - 5 reps - 5-10 hold   PATIENT EDUCATION: Education details: See tx section above for details  Person educated: Patient Education method: Verbal Instruction, Teach back, Handouts  Education comprehension: States and demonstrates understanding, Additional Education required    HOME EXERCISE PROGRAM: Access Code: [email or web address] URL: [email or web address] Date: 09/20/2023 Prepared by: Melvenia Ada   GOALS: Goals reviewed with patient? Yes   SHORT TERM GOALS: (STG required if POC>30 days) Target Date: 10/08/23  Pt will obtain protective, custom orthotic. Goal status: 09/20/23: MET   2.  Pt will demo/state understanding of initial HEP to improve pain levels and prerequisite motion. Goal status: INITIAL   LONG TERM GOALS: Target Date: 10/22/23  Pt will improve functional ability by decreased impairment per PSFS assessment to 8 or better, for better quality of life. Goal status: INITIAL  2.  Pt will improve grip strength in Rt hand to at least 40lbs for functional use at home and in IADLs. Goal status: 10/12/23: MET  3.  Pt will improve A/ROM in left ring finger PIP joint extension from (-20) to at least (-10), to have functional motion for tasks like reach and grasp.  Goal status: INITIAL  4.  Pt will  decrease pain at worst from 3-4/10 to 1/10 or better to have better sleep  and occupational participation in daily roles. Goal status: INITIAL   ASSESSMENT:  CLINICAL IMPRESSION: 10/12/23: Left hand much straighter, right hand less painful and strong  Eval: Patient is a 68 y.o. female who was seen today for occupational therapy evaluation for pain and reports of instability and decreased functional ability in the right hand small finger mainly after a fall.  She also reports stiffness and ongoing PIP joint contracture in the left hand ring finger.  The patient will benefit from outpatient occupational therapy to decrease symptoms, improve functional upper extremity use, and increase quality of life.     PLAN:  OT FREQUENCY: 1-2x/week  OT DURATION: 4 weeks through 10/22/23 and up to 5 total visits as needed   PLANNED INTERVENTIONS: 97535 self care/ADL training, 02889 therapeutic exercise, 97530 therapeutic activity, 97112 neuromuscular re-education, 97140 manual therapy, 97035 ultrasound, 97032 electrical stimulation (manual), 97760 Orthotic Initial, [id number] Orthotic/Prosthetic subsequent, compression bandaging, Dry needling, energy conservation, coping strategies training, and patient/family education  RECOMMENDED OTHER SERVICES: none now    CONSULTED AND AGREED WITH PLAN OF CARE: Patient  PLAN FOR NEXT SESSION:   See back as needed in next 4 weeks, otherwise D/C assuming all goals met   Melvenia Ada, OTR/L, CHT  10/12/2023, 12:00 PM

## 2023-10-12 ENCOUNTER — Ambulatory Visit: Admitting: Rehabilitative and Restorative Service Providers"

## 2023-10-12 ENCOUNTER — Encounter: Payer: Self-pay | Admitting: Rehabilitative and Restorative Service Providers"

## 2023-10-12 DIAGNOSIS — M6281 Muscle weakness (generalized): Secondary | ICD-10-CM

## 2023-10-12 DIAGNOSIS — M25542 Pain in joints of left hand: Secondary | ICD-10-CM

## 2023-10-12 DIAGNOSIS — M25641 Stiffness of right hand, not elsewhere classified: Secondary | ICD-10-CM

## 2023-10-12 DIAGNOSIS — R278 Other lack of coordination: Secondary | ICD-10-CM | POA: Diagnosis not present

## 2023-10-12 DIAGNOSIS — M79641 Pain in right hand: Secondary | ICD-10-CM | POA: Diagnosis not present

## 2023-10-12 DIAGNOSIS — M25642 Stiffness of left hand, not elsewhere classified: Secondary | ICD-10-CM | POA: Diagnosis not present

## 2023-10-19 ENCOUNTER — Encounter: Admitting: Rehabilitative and Restorative Service Providers"

## 2023-11-04 ENCOUNTER — Telehealth: Payer: Self-pay | Admitting: Family Medicine

## 2023-11-04 NOTE — Telephone Encounter (Signed)
 Copied from CRM [id number]. Topic: Medicare AWV >> Nov 04, 2023 11:25 AM Nathanel DEL wrote: Reason for CRM: Called LVM 11/04/2023 to schedule AWV. Please schedule Virtual or Telehealth visits ONLY.   Nathanel Paschal; Care Guide Ambulatory Clinical Support [place] [facility] Direct Dial: [phone]

## 2023-11-10 NOTE — Therapy (Signed)
 OUTPATIENT OCCUPATIONAL THERAPY TREATMENT, PROGRESS and DISCHARGE NOTE  Patient Name: Kathryn Pineda MRN: [id number] DOB:07/13/1955, 68 y.o., female Today's Date: 11/15/2023  PCP: [facility] ALF MD REFERRING PROVIDER: Dr. Arlinda               OCCUPATIONAL THERAPY DISCHARGE SUMMARY  Visits from Start of Care: 3  Current functional level related to goals / functional outcomes: CLINICAL IMPRESSION: 11/15/23: She returned after vacations just to have a check of status for her left hand and right hand issues.  She has met all long-term goals and happily discharge therapy today.  OT will request additional authorization just to cover today's final visit.    PLAN:  OT FREQUENCY: 1 additional visit to cover today's visit   OT DURATION: 4 additional weeks from 10/22/23 - 11/15/23 and up to 3 total visits as needed   Melvenia Ada, OTR/L, CHT 11/15/23           END OF SESSION:  OT End of Session - 11/15/23 1019     Visit Number 3    Number of Visits 5    Date for Recertification  10/22/23    Authorization Type BCBS    OT Start Time 1020    OT Stop Time 1050    OT Time Calculation (min) 30 min    Activity Tolerance Patient tolerated treatment well;No increased pain    Behavior During Therapy WFL for tasks assessed/performed            Past Medical History:  Diagnosis Date   Anal fissure    Arthritis    Biliary colic 5/96   ERCP w/spincterotomy   Condyloma    Deep vein thrombosis (DVT) of lower extremity (HCC)    Fatigue    loss of sleep   Generalized headaches    GERD (gastroesophageal reflux disease)    Hemorrhoid    Hiatal hernia    Migraines    Obesity    Phlebitis of leg    PONV (postoperative nausea and vomiting)    Primary osteoarthritis of both knees    Stress incontinence    Wilson's disease    Past Surgical History:  Procedure Laterality Date   ABDOMINAL HYSTERECTOMY     ANAL FISSURECTOMY  1986   ANTERIOR AND POSTERIOR REPAIR N/A  11/24/2016   Procedure: ANTERIOR (CYSTOCELE) AND POSTERIOR REPAIR (RECTOCELE);  Surgeon: Gaston Hamilton, MD;  Location: [facility];  Service: Urology;  Laterality: N/A;   BUNIONECTOMY Bilateral 2005   CESAREAN SECTION  1990   prolapsed cord   CYSTOSCOPY N/A 11/24/2016   Procedure: CYSTOSCOPY;  Surgeon: Gaston Hamilton, MD;  Location: [facility];  Service: Urology;  Laterality: N/A;   ENDOVENOUS ABLATION SAPHENOUS VEIN W/ LASER  2008   both lower legs   LAPAROSCOPIC CHOLECYSTECTOMY  1997   LAPAROSCOPIC GASTRIC SLEEVE RESECTION WITH HIATAL HERNIA REPAIR  09/16/2010   LAPAROSCOPIC VAGINAL HYSTERECTOMY WITH SALPINGECTOMY Bilateral 11/24/2016   Procedure: LAPAROSCOPIC ASSISTED VAGINAL HYSTERECTOMY WITH SALPINGO-OOPHORECTOMY;  Surgeon: Cleotilde Ronal RAMAN, MD;  Location: [facility];  Service: Gynecology;  Laterality: Bilateral;   LOWER EXTREMITY VENOGRAPHY  08/19/2022   Procedure: LOWER EXTREMITY VENOGRAPHY;  Surgeon: Lanis Fonda BRAVO, MD;  Location: [facility] INVASIVE CV LAB;  Service: Cardiovascular;;   PERIPHERAL VASCULAR THROMBECTOMY N/A 08/19/2022   Procedure: PERIPHERAL VASCULAR THROMBECTOMY;  Surgeon: Lanis Fonda BRAVO, MD;  Location: [facility] INVASIVE CV LAB;  Service: Cardiovascular;  Laterality: N/A;   PERIPHERAL VASCULAR ULTRASOUND/IVUS  08/19/2022   Procedure: Peripheral Vascular Ultrasound/IVUS;  Surgeon:  Lanis Fonda BRAVO, MD;  Location: [facility] INVASIVE CV LAB;  Service: Cardiovascular;;   Patient Active Problem List   Diagnosis Date Noted   Dyspareunia, female 05/06/2023   Urge urinary incontinence 02/03/2023   Vaginal atrophy 02/03/2023   Fecal incontinence 02/03/2023   Pulmonary emboli (HCC) 08/16/2022   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   Acute deep vein thrombosis (DVT) of femoral vein of left lower extremity (HCC) 08/16/2022   Internal hemorrhoids 07/24/2022   Thrombophlebitis of superficial veins of lower extremity 07/24/2022   History of DVT (deep vein thrombosis)  07/24/2022   Viral URI 04/10/2022   History of colonic polyps 07/15/2021   Rectocele 11/13/2016   Pelvic organ prolapse quantification stage 2 cystocele 11/13/2016   Bronchitis with bronchospasm 09/17/2014   Seasonal allergies 09/17/2014   Obesity (BMI 30-39.9) 12/24/2010   Hx of laparoscopic gastric banding 09/23/2010    ONSET DATE: end of May 2025 fall  REFERRING DIAG: F20.357 (ICD-10-CM) - Pain in left hand   THERAPY DIAG:  Muscle weakness (generalized)  Other lack of coordination  Stiffness of left hand, not elsewhere classified  Pain in right hand  Stiffness of right hand, not elsewhere classified  Pain in joint of left hand  Rationale for Evaluation and Treatment: Rehabilitation  PERTINENT HISTORY: Per MD notes: she sustained a dislocation of the small finger MP region without associated fracture and underwent appropriate close reduction. X-rays after injury have been negative. Her range of motion is appropriate today. There is no significant rotational abnormality, normal cascade of the digits is visualized. I have recommended that she do some occupational therapy for strengthening exercises of the right hand, focus will be on the PIP and DIP regions of the small finger.    PRECAUTIONS: None  RED FLAGS: None   WEIGHT BEARING RESTRICTIONS: No  SUBJECTIVE:   SUBJECTIVE STATEMENT: She states now not having any significant pain, her ring finger straightening out better on the left hand, her right hand has no significant pain on the fifth metacarpal or small finger.     PAIN:  Are you having pain? Yes: NPRS scale: 0/10 at rest, up to  1-2/10 in past week at worst Pain location: Right hand small finger metacarpal area Pain description: Mildly aching and sometimes more sore Aggravating factors: Gripping Relieving factors: Rest   PATIENT GOALS: To improve pain in the right hand and also flexion contracture in the left hand  NEXT MD VISIT: As  needed   OBJECTIVE: (All objective assessments below are from initial evaluation on: 09/20/23 unless otherwise specified.)   HAND DOMINANCE: Right   ADLs: Overall ADLs: States no significant issues now     FUNCTIONAL OUTCOME MEASURES: 11/15/23: PSFS: 9  EVAL: Patient Specific Functional Scale: TBD -patient provides difficult activities but does not provide rating at evaluation (Pushing up from floor, writing, grasping objects)  (Higher Score  =  Better Ability for the Selected Tasks)     UPPER EXTREMITY ROM     Shoulder to Wrist AROM Rt eval Rt 8.26.25 Rt 11/15/23  Shoulder flexion     Shoulder abduction     Shoulder extension     Shoulder internal rotation     Shoulder external rotation     Elbow flexion     Elbow extension     Forearm supination     Forearm pronation      Wrist flexion 67 69 73  Wrist extension 63 74 68  Wrist ulnar deviation  Wrist radial deviation     Functional dart thrower's motion (F-DTM) in ulnar flexion     F-DTM in radial extension      (Blank rows = not tested)   Hand AROM Right eval Left eval Lt 10/12/23 Lt 11/15/23  Full Fist Ability (or Gap to Distal Palmar Crease) Makes a full fist Makes a full fist  Full fist  Thumb Opposition  (Kapandji Scale)  Within functional limits Within functional limits  WNL  Ring MCP (0-90)        Ring PIP (0-100)   (-20) - 90  (-12) - 95 (-8)  - 85  Ring DIP (0-70)        Little MCP (0-90) 0- 89      Little PIP (0-100) 0- 92      Little DIP (0-70) 0- 74      (Blank rows = not tested)   HAND FUNCTION: 11/15/23: Grip Rt: 58.3#, Lt: 51#   10/12/23:  Grip: Rt: 57#; Lt: 57#    Eval: TBD-this will be checked in upcoming sessions, she does state pain with gripping with the right hand so this could potentially be weak and limiting.   OBSERVATIONS:   Eval: In the right hand: hook of hamate area has some soreness and just distal to this, wrist extension creates type of clicking near the [facility] joint of the  fifth finger, left hand ring finger PIP joint has an overt, mild flexion contracture.   Rt 5th MC pain; Lt RF PIP J contracture    TODAY'S TREATMENT:  11/15/23: Pt performs AROM, gripping, and strength with bilateral hands/arms against therapist's resistance for exercise/activities as well as new measures today. OT also discusses home and functional tasks with the pt and reviews goals.  She has met all of her long-term goals today.  Using the complied data, OT also reviews home exercises and provides updated recommendations and upgrades as below, including performing reverse blocking on left ring finger as needed.  Most exercises are optional at this point but she was recommended to stretch 2 or 3 times a day for anyone over the age of 4. Pt states understanding and tolerates upgrades well.     Exercises - Wrist Prayer Stretch  - 4 x daily - 3-5 reps - 15 sec hold - Seated Wrist Flexion Stretch  - 3-4 x daily - 3 reps - 15 second  hold - PUSH KNUCKLES DOWN  - 4 x daily - 3-5 reps - 15 seconds hold - Towel Roll Grip with Forearm in Neutral  - 2-3 x daily - 5 reps - 10 sec hold - Hand AROM Reverse Blocking  - 4-6 x daily - 10-15 reps   PATIENT EDUCATION: Education details: See tx section above for details  Person educated: Patient Education method: Verbal Instruction, Teach back, Handouts  Education comprehension: States and demonstrates understanding    HOME EXERCISE PROGRAM: Access Code: [email or web address] URL: [email or web address] Date: 09/20/2023 Prepared by: Melvenia Ada   GOALS: Goals reviewed with patient? Yes   SHORT TERM GOALS: (STG required if POC>30 days) Target Date: 10/08/23  Pt will obtain protective, custom orthotic. Goal status: 09/20/23: MET   2.  Pt will demo/state understanding of initial HEP to improve pain levels and prerequisite motion. Goal status: 11/15/23: MET  INITIAL   LONG TERM GOALS: Target Date: 11/15/23  Pt will improve functional  ability by decreased impairment per PSFS assessment to 8 or better, for better quality of life. Goal status: 11/15/23: 9 MET  2.  Pt will improve grip strength in Rt hand to at least 40lbs for functional use at home and in IADLs. Goal status: 10/12/23: MET  3.  Pt will improve A/ROM in left ring finger PIP joint extension from (-20) to at least (-10), to have functional motion for tasks like reach and grasp.  Goal status: 11/15/23: MET  4.  Pt will decrease pain at worst from 3-4/10 to 1/10 or better to have better sleep and occupational participation in daily roles. Goal status: 11/15/23: MET   ASSESSMENT:  CLINICAL IMPRESSION: 11/15/23: She returned after vacations just to have a check of status for her left hand and right hand issues.  She has met all long-term goals and happily discharge therapy today.  OT will request additional authorization just to cover today's final visit.    PLAN:  OT FREQUENCY: 1 additional visit to cover today's visit   OT DURATION: 4 additional weeks from 10/22/23 - 11/15/23 and up to 3 total visits as needed   PLANNED INTERVENTIONS: 97535 self care/ADL training, 02889 therapeutic exercise, 97530 therapeutic activity, 97112 neuromuscular re-education, 97140 manual therapy, 97035 ultrasound, 97032 electrical stimulation (manual), 97760 Orthotic Initial, [id number] Orthotic/Prosthetic subsequent, compression bandaging, Dry needling, energy conservation, coping strategies training, and patient/family education  RECOMMENDED OTHER SERVICES: none now    CONSULTED AND AGREED WITH PLAN OF CARE: Patient  PLAN FOR NEXT SESSION:   N/A/discharge  Melvenia Ada, OTR/L, CHT  11/15/2023, 11:01 AM

## 2023-11-15 ENCOUNTER — Ambulatory Visit: Admitting: Rehabilitative and Restorative Service Providers"

## 2023-11-15 ENCOUNTER — Encounter: Payer: Self-pay | Admitting: Rehabilitative and Restorative Service Providers"

## 2023-11-15 DIAGNOSIS — M6281 Muscle weakness (generalized): Secondary | ICD-10-CM

## 2023-11-15 DIAGNOSIS — M79641 Pain in right hand: Secondary | ICD-10-CM | POA: Diagnosis not present

## 2023-11-15 DIAGNOSIS — M25542 Pain in joints of left hand: Secondary | ICD-10-CM

## 2023-11-15 DIAGNOSIS — R278 Other lack of coordination: Secondary | ICD-10-CM | POA: Diagnosis not present

## 2023-11-15 DIAGNOSIS — M25642 Stiffness of left hand, not elsewhere classified: Secondary | ICD-10-CM | POA: Diagnosis not present

## 2023-11-15 DIAGNOSIS — M25641 Stiffness of right hand, not elsewhere classified: Secondary | ICD-10-CM

## 2023-12-02 ENCOUNTER — Other Ambulatory Visit (HOSPITAL_BASED_OUTPATIENT_CLINIC_OR_DEPARTMENT_OTHER): Payer: Self-pay

## 2023-12-02 ENCOUNTER — Inpatient Hospital Stay: Attending: Hematology & Oncology

## 2023-12-02 ENCOUNTER — Encounter: Payer: Self-pay | Admitting: Medical Oncology

## 2023-12-02 ENCOUNTER — Inpatient Hospital Stay (HOSPITAL_BASED_OUTPATIENT_CLINIC_OR_DEPARTMENT_OTHER): Admitting: Medical Oncology

## 2023-12-02 VITALS — BP 116/66 | HR 68 | Temp 97.8°F | Resp 18 | Wt 219.8 lb

## 2023-12-02 DIAGNOSIS — Z86711 Personal history of pulmonary embolism: Secondary | ICD-10-CM | POA: Insufficient documentation

## 2023-12-02 DIAGNOSIS — Z86718 Personal history of other venous thrombosis and embolism: Secondary | ICD-10-CM

## 2023-12-02 DIAGNOSIS — I82412 Acute embolism and thrombosis of left femoral vein: Secondary | ICD-10-CM

## 2023-12-02 DIAGNOSIS — Z79818 Long term (current) use of other agents affecting estrogen receptors and estrogen levels: Secondary | ICD-10-CM | POA: Insufficient documentation

## 2023-12-02 DIAGNOSIS — Z7901 Long term (current) use of anticoagulants: Secondary | ICD-10-CM

## 2023-12-02 LAB — CMP (CANCER CENTER ONLY)
ALT: 10 U/L (ref 0–44)
AST: 19 U/L (ref 15–41)
Albumin: 4.6 g/dL (ref 3.5–5.0)
Alkaline Phosphatase: 60 U/L (ref 38–126)
Anion gap: 11 (ref 5–15)
BUN: 11 mg/dL (ref 8–23)
CO2: 26 mmol/L (ref 22–32)
Calcium: 9.3 mg/dL (ref 8.9–10.3)
Chloride: 104 mmol/L (ref 98–111)
Creatinine: 0.77 mg/dL (ref 0.44–1.00)
GFR, Estimated: >60 mL/min (ref >60)
Glucose, Bld: 129 mg/dL — ABNORMAL HIGH (ref 70–99)
Potassium: 4.4 mmol/L (ref 3.5–5.1)
Sodium: 140 mmol/L (ref 135–145)
Total Bilirubin: 0.4 mg/dL (ref 0.0–1.2)
Total Protein: 6.9 g/dL (ref 6.5–8.1)

## 2023-12-02 LAB — CBC
HCT: 40.7 % (ref 36.0–46.0)
Hemoglobin: 13.7 g/dL (ref 12.0–15.0)
MCH: 32.1 pg (ref 26.0–34.0)
MCHC: 33.7 g/dL (ref 30.0–36.0)
MCV: 95.3 fL (ref 80.0–100.0)
Platelets: 265 K/uL (ref 150–400)
RBC: 4.27 MIL/uL (ref 3.87–5.11)
RDW: 13.1 % (ref 11.5–15.5)
WBC: 5.9 K/uL (ref 4.0–10.5)
nRBC: 0 % (ref 0.0–0.2)

## 2023-12-02 MED ORDER — CLOBETASOL PROPIONATE 0.05 % EX OINT
1.0000 | TOPICAL_OINTMENT | Freq: Two times a day (BID) | CUTANEOUS | 0 refills | Status: AC
  Filled 2023-12-02: qty 30, 15d supply, fill #0

## 2023-12-02 MED ORDER — APIXABAN 5 MG PO TABS
5.0000 mg | ORAL_TABLET | Freq: Two times a day (BID) | ORAL | 3 refills | Status: AC
  Filled 2023-12-02: qty 180, 90d supply, fill #0

## 2023-12-02 NOTE — Progress Notes (Signed)
 Hematology and Oncology Follow Up Visit  Kathryn Pineda [id number] 06-09-1955 68 y.o. 12/02/2023   Principle Diagnosis:  Recurrent thromboembolic disease-left leg/pulmonary embolism Normal hypercoag work up  Current Therapy:   Eliquis  5 mg p.o. twice daily     Interim History:  Kathryn Pineda is back for follow-up.    Today Kathryn Pineda states that Kathryn Pineda has been well. Kathryn Pineda does have some itching on Kathryn Pineda left lower leg which has been present fro a few weeks. Benadryl  helps for a while.   Kathryn Pineda is doing well on the Eliquis . No known bleeding episodes.   Kathryn Pineda did have a CT angiogram on 12/21/2022. This did not show any residual pulmonary embolism. Kathryn Pineda also has a chronic thrombus in the common femoral vein at the saphenofemoral junction. No sign of oncological origin on CT Chest/Abd/pelvis.   Kathryn Pineda has had no chest wall pain.  There has been no cough.  Kathryn Pineda has had no nausea or vomiting.  Kathryn Pineda has had no rashes.  There has been no obvious change in bowel or bladder habits. Mild intermittent hemorrhoidal bleeding.   Kathryn Pineda has noticed some variation in Kathryn Pineda pulse rate recently. Between 60s and 90s at rest. No SOB, significant peripheral edema or chest pain. Kathryn Pineda does mention a few painful bumps Kathryn Pineda has gotten on Kathryn Pineda skin over the last week or so.   Kathryn Pineda has had no fever.  The been no problems with COVID.  Overall, I would say that Kathryn Pineda performance status is probably ECOG 1.  Wt Readings from Last 3 Encounters:  12/02/23 219 lb 12.8 oz (99.7 kg)  08/19/23 211 lb (95.7 kg)  08/12/23 216 lb (98 kg)   Medications:  Current Outpatient Medications:    acetaminophen  (TYLENOL ) 500 MG tablet, Take 500 mg by mouth every 6 (six) hours as needed for mild pain or moderate pain., Disp: , Rfl:    albuterol  (VENTOLIN  HFA) 108 (90 Base) MCG/ACT inhaler, Inhale 2 puffs into the lungs every 6 (six) hours as needed for wheezing or shortness of breath., Disp: 8 g, Rfl: 0   apixaban  (ELIQUIS ) 5 MG TABS tablet, Take 1 tablet (5 mg  total) by mouth 2 (two) times daily., Disp: 180 tablet, Rfl: 3   Cholecalciferol (VITAMIN D3) 125 MCG (5000 UT) CAPS, Take 5,000 Units by mouth daily., Disp: , Rfl:    Cyanocobalamin  (B-12 PO), Take 1 tablet by mouth daily., Disp: , Rfl:    EPINEPHrine  (EPIPEN  2-PAK) 0.3 mg/0.3 mL IJ SOAJ injection, Inject 0.3 mg into the muscle as needed for anaphylaxis., Disp: 2 each, Rfl: PRN   estradiol  (ESTRACE ) 0.1 MG/GM vaginal cream, Place 0.5 g vaginally 2 (two) times a week. Place 0.5g nightly for two weeks then twice a week after, Disp: 30 g, Rfl: 3   loratadine  (CLARITIN ) 10 MG tablet, Take 10 mg by mouth daily., Disp: , Rfl:    Multiple Vitamin (MULTIVITAMIN) tablet, Take 1 tablet by mouth daily., Disp: , Rfl:    Multiple Vitamins-Minerals (EMERGEN-C IMMUNE) PACK, Take 0.5 packets by mouth daily. As needed, Disp: , Rfl:    Vibegron  (GEMTESA ) 75 MG TABS, Take 1 tablet (75 mg total) by mouth daily., Disp: 90 tablet, Rfl: 2  Allergies:  Allergies  Allergen Reactions   Paxlovid  [Nirmatrelvir -Ritonavir ] Anaphylaxis    Severe allergic reaction, throat closed up and caused blisters all around the mouth.    Penicillins Itching and Rash   Adhesive [Tape] Other (See Comments)    Band-Aid causes blisters    Past Medical  History, Surgical history, Social history, and Family History were reviewed and updated.  Review of Systems: Review of Systems  Constitutional: Negative.   HENT:  Negative.    Eyes: Negative.   Respiratory: Negative.    Cardiovascular: Negative.   Gastrointestinal: Negative.   Endocrine: Negative.   Genitourinary: Negative.    Musculoskeletal: Negative.   Skin: Negative.   Neurological: Negative.   Hematological: Negative.   Psychiatric/Behavioral: Negative.      Physical Exam:  weight is 219 lb 12.8 oz (99.7 kg). Kathryn Pineda oral temperature is 97.8 F (36.6 C). Kathryn Pineda blood pressure is 116/66 and Kathryn Pineda pulse is 68. Kathryn Pineda respiration is 18 and oxygen saturation is 100%.   Wt Readings  from Last 3 Encounters:  12/02/23 219 lb 12.8 oz (99.7 kg)  08/19/23 211 lb (95.7 kg)  08/12/23 216 lb (98 kg)    Physical Exam Vitals reviewed.  HENT:     Head: Normocephalic and atraumatic.  Eyes:     Pupils: Pupils are equal, round, and reactive to light.  Cardiovascular:     Rate and Rhythm: Normal rate and regular rhythm.     Heart sounds: Normal heart sounds.  Pulmonary:     Effort: Pulmonary effort is normal.     Breath sounds: Normal breath sounds.  Abdominal:     General: Bowel sounds are normal.     Palpations: Abdomen is soft.  Musculoskeletal:        General: No tenderness or deformity. Normal range of motion.     Cervical back: Normal range of motion.  Lymphadenopathy:     Cervical: No cervical adenopathy.  Skin:    General: Skin is warm and dry.     Findings: Rash (mild urticaria and erythema of the left lower leg) present. No bruising or erythema.     Comments: One resolving pustule of right medial eyebrow and right hand.  Neurological:     Mental Status: Kathryn Pineda is alert and oriented to person, place, and time.  Psychiatric:        Behavior: Behavior normal.        Thought Content: Thought content normal.        Judgment: Judgment normal.      Lab Results  Component Value Date   WBC 5.9 12/02/2023   HGB 13.7 12/02/2023   HCT 40.7 12/02/2023   MCV 95.3 12/02/2023   PLT 265 12/02/2023     Chemistry      Component Value Date/Time   NA 140 12/02/2023 1506   K 4.4 12/02/2023 1506   CL 104 12/02/2023 1506   CO2 26 12/02/2023 1506   BUN 11 12/02/2023 1506   CREATININE 0.77 12/02/2023 1506   CREATININE 0.77 03/06/2016 1203      Component Value Date/Time   CALCIUM 9.3 12/02/2023 1506   ALKPHOS 60 12/02/2023 1506   AST 19 12/02/2023 1506   ALT 10 12/02/2023 1506   BILITOT 0.4 12/02/2023 1506     Encounter Diagnoses  Name Primary?   Personal history of venous thrombosis and embolism    Encounter for current long-term use of anticoagulants Yes    Impression and Plan: Kathryn Pineda is a very nice 68 year old white female.  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 Kathryn Pineda is on lifelong anticoagulation secondary to recurrent thromboembolic disease. Fortunately Kathryn Pineda did have a reassuring hypercoagulable work up and no sign of oncological cause on CT chest/ABD Pelvis. Kathryn Pineda is currently on therapeutic dose of Eliquis  5 mg BID.  No recent VTE.  CBC normal CMP normal (non-fasting) We have elected to continue Eliquis  at current dose. At Kathryn Pineda next appointment we can discuss possibly reducing to Eliquis  2.5 mg BID.   RTC 6 months APP, labs (CBC, CMP)  Lauraine CHRISTELLA Dais, PA-C 10/16/20253:45 PM

## 2023-12-04 NOTE — Progress Notes (Signed)
 [facility] [address], [address] [place], [place] [address] [phone]  Date:  12/13/2023   Name:  Kathryn Pineda   DOB:  [birth year]   MRN:  [id number]  PCP:  Watt Harlene BROCKS, MD    Chief Complaint: Annual Exam (Requesting a welcome to medicare )   History of Present Illness:  Kathryn Pineda is a 68 y.o. very pleasant female patient who presents with the following:  Pt seen today for CPE Last seen by me in June for a follow-up visit  History of DVT/PE treated with eliquis  for 3 months in 2020 then had recurrence, she is anticoagulated now permanently, cystocele, lap band surgery, prediabetes She has 3 adult children, her daughter who is her youngest is in medical school at [employer] Her 2 sons are older and out of school  She saw GYN Dr Cleotilde in July Visit with hematology earlier this month -  Principle Diagnosis:  Recurrent thromboembolic disease-left leg/pulmonary embolism Normal hypercoag work up Current Therapy:        Eliquis  5 mg p.o. twice daily  She was in the ER with chest pain on 10/21- she was evaluated and released to home  -flu; done  -covid booster -mammo- UTD  -colon 2022 -pap  s/p hyst -dexa 2/24, normal   Discussed the use of AI scribe software for clinical note transcription with the patient, who gave verbal consent to proceed.  History of Present Illness Kathryn Pineda is a 68 year old female who presents with recent chest pain and a history of falls.  She experienced chest pain two to three days ago, described as a stabbing pain occurring first thing in the morning. Initially, she attributed it to gastrointestinal issues but sought emergency care due to the severity. An EKG and blood work, including troponin levels, were performed and returned normal. A previous CT coronary scan from last year showed a coronary calcium score of zero.  She recounts two falls, one in [place] and another in [place],  the latter occurring in late September. During the second incident, she twisted her knee but did not fall completely. She is attempting to self-heal and notes some improvement. No issues with balance in her daily activities, but she mentions a sensation of listing to the right, raising concerns about a possible mild stroke.  She has been experiencing a rash on her leg for about 3 months for which she was prescribed a cortisone cream by a doctor at the cancer center. She has been using the cream for one week and reports significant improvement.  She is currently taking Eliquis , estrogen cream, Gemtesa , and a cortisone cream. She has a history of a lap band and is considering consulting her surgeon due to gastrointestinal symptoms, including burning and slow bowel movements, which she suspects might be related to a hiatal hernia. She has a history of a repaired hiatal hernia and notes similar symptoms to those experienced before the repair.  She has a history of a blood clot and expresses concern about a possible mild stroke in the past, noting that she sometimes seems to have facial asymmetry and drainage issues on one side of her face. She is on Eliquis  for anticoagulation. 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   Acute deep vein thrombosis (DVT) of femoral vein of left lower extremity (HCC) 08/16/2022   Internal hemorrhoids 07/24/2022   Thrombophlebitis of superficial veins of lower extremity 07/24/2022   History of DVT (deep vein thrombosis) 07/24/2022   Viral URI 04/10/2022    History of colonic polyps 07/15/2021   Rectocele 11/13/2016   Pelvic organ prolapse quantification stage 2 cystocele 11/13/2016   Bronchitis with bronchospasm 09/17/2014   Seasonal allergies 09/17/2014   Obesity (BMI 30-39.9) 12/24/2010   Hx of laparoscopic gastric banding 09/23/2010    Past Medical History:  Diagnosis Date   Anal fissure    Arthritis    Biliary colic 5/96   ERCP w/spincterotomy   Condyloma    Deep vein thrombosis (DVT) of lower extremity (HCC)    Fatigue    loss of sleep   Generalized headaches    GERD (gastroesophageal reflux disease)    Hemorrhoid    Hiatal hernia    Migraines    Obesity    Phlebitis of leg    PONV (postoperative nausea and vomiting)    Primary osteoarthritis of both knees    Stress incontinence    Wilson's disease (HCC)     Past Surgical History:  Procedure Laterality Date   ABDOMINAL HYSTERECTOMY     ANAL FISSURECTOMY  1986   ANTERIOR AND POSTERIOR REPAIR N/A 11/24/2016   Procedure: ANTERIOR (CYSTOCELE) AND POSTERIOR REPAIR (RECTOCELE);  Surgeon: Gaston Hamilton, MD;  Location: [facility];  Service: Urology;  Laterality: N/A;   BUNIONECTOMY Bilateral 2005   CESAREAN SECTION  1990   prolapsed cord   CYSTOSCOPY N/A 11/24/2016   Procedure: CYSTOSCOPY;  Surgeon: Gaston Hamilton, MD;  Location: [facility];  Service: Urology;  Laterality: N/A;   ENDOVENOUS ABLATION SAPHENOUS VEIN W/ LASER  2008   both lower legs   LAPAROSCOPIC CHOLECYSTECTOMY  1997   LAPAROSCOPIC GASTRIC SLEEVE RESECTION WITH HIATAL HERNIA REPAIR  09/16/2010   LAPAROSCOPIC VAGINAL HYSTERECTOMY WITH SALPINGECTOMY Bilateral 11/24/2016   Procedure: LAPAROSCOPIC ASSISTED VAGINAL HYSTERECTOMY WITH SALPINGO-OOPHORECTOMY;  Surgeon: Cleotilde Ronal RAMAN, MD;  Location: [facility];  Service: Gynecology;  Laterality: Bilateral;   LOWER EXTREMITY VENOGRAPHY  08/19/2022   Procedure: LOWER EXTREMITY VENOGRAPHY;  Surgeon: Lanis Fonda BRAVO, MD;  Location: [facility] INVASIVE CV LAB;  Service:  Cardiovascular;;   PERIPHERAL VASCULAR THROMBECTOMY N/A 08/19/2022   Procedure: PERIPHERAL VASCULAR THROMBECTOMY;  Surgeon: Lanis Fonda BRAVO, MD;  Location: [facility] INVASIVE CV LAB;  Service: Cardiovascular;  Laterality: N/A;   PERIPHERAL VASCULAR ULTRASOUND/IVUS  08/19/2022   Procedure: Peripheral Vascular Ultrasound/IVUS;  Surgeon: Lanis Fonda BRAVO, MD;  Location: [facility] INVASIVE CV LAB;  Service: Cardiovascular;;    Social History   Tobacco Use   Smoking status: Never   Smokeless tobacco: Never  Vaping Use   Vaping status: Never Used  Substance Use Topics   Alcohol use: Yes    Alcohol/week: 1.0 standard drink of alcohol    Types: 1 Glasses of wine per week    Comment: occ   Drug use: No    Family History  Problem Relation Age of Onset   Osteoarthritis Mother    Diabetes Father    Colon polyps Father    Hypertension Brother    Cancer Maternal  Grandmother        unaware/ unknown primary source   Cancer Maternal Uncle        leukemia   Bladder Cancer Neg Hx    Uterine cancer Neg Hx     Allergies  Allergen Reactions   Paxlovid  [Nirmatrelvir -Ritonavir ] Anaphylaxis    Severe allergic reaction, throat closed up and caused blisters all around the mouth.    Penicillins Itching and Rash   Adhesive [Tape] Other (See Comments)    Band-Aid causes blisters    Medication list has been reviewed and updated.  Current Outpatient Medications on File Prior to Visit  Medication Sig Dispense Refill   acetaminophen  (TYLENOL ) 500 MG tablet Take 500 mg by mouth every 6 (six) hours as needed for mild pain or moderate pain.     albuterol  (VENTOLIN  HFA) 108 (90 Base) MCG/ACT inhaler Inhale 2 puffs into the lungs every 6 (six) hours as needed for wheezing or shortness of breath. 8 g 0   apixaban  (ELIQUIS ) 5 MG TABS tablet Take 1 tablet (5 mg total) by mouth 2 (two) times daily. 180 tablet 3   Cholecalciferol (VITAMIN D3) 125 MCG (5000 UT) CAPS Take 5,000 Units by mouth daily.     clobetasol ointment  (TEMOVATE) 0.05 % Apply 1 Application topically 2 (two) times daily. For up to 2 weeks. 30 g 0   Cyanocobalamin  (B-12 PO) Take 1 tablet by mouth daily.     EPINEPHrine  (EPIPEN  2-PAK) 0.3 mg/0.3 mL IJ SOAJ injection Inject 0.3 mg into the muscle as needed for anaphylaxis. 2 each PRN   estradiol  (ESTRACE ) 0.1 MG/GM vaginal cream Place 0.5 g vaginally 2 (two) times a week. Place 0.5g nightly for two weeks then twice a week after 30 g 3   loratadine  (CLARITIN ) 10 MG tablet Take 10 mg by mouth daily.     Multiple Vitamin (MULTIVITAMIN) tablet Take 1 tablet by mouth daily.     Multiple Vitamins-Minerals (EMERGEN-C IMMUNE) PACK Take 0.5 packets by mouth daily. As needed     Vibegron  (GEMTESA ) 75 MG TABS Take 1 tablet (75 mg total) by mouth daily. 90 tablet 2   No current facility-administered medications on file prior to visit.    Review of Systems:  As per HPI- otherwise negative.   Physical Examination: Vitals:   12/13/23 1411  BP: 114/64  Pulse: (!) 59  Temp: 97.6 F (36.4 C)  SpO2: 96%   Vitals:   12/13/23 1411  Weight: 221 lb 12.8 oz (100.6 kg)  Height: 5' 8 (1.727 m)   Body mass index is 33.72 kg/m. Ideal Body Weight: Weight in (lb) to have BMI = 25: 164.1  GEN: no acute distress.  Mildly obese, looks well HEENT: Atraumatic, Normocephalic.  Bilateral TM wnl, oropharynx normal.  PEERL,EOMI.   Ears and Nose: No external deformity. CV: RRR, No M/G/R. No JVD. No thrill. No extra heart sounds. PULM: CTA B, no wheezes, crackles, rhonchi. No retractions. No resp. distress. No accessory muscle use. ABD: S, NT, ND, +BS. No rebound. No HSM. EXTR: No c/c/e PSYCH: Normally interactive. Conversant.    Assessment and Plan: Physical exam  Acute deep vein thrombosis (DVT) of femoral vein of left lower extremity (HCC)  Fall, subsequent encounter - Plan: MR Brain Wo Contrast  Assessment & Plan Adult Wellness Visit Routine wellness visit with recent falls and potential mild stroke  symptoms. Up to date on screenings and vaccinations. - Continue routine health maintenance screenings and vaccinations. - Encourage follow-up with lap band  specialist for potential issues related to gastric banding. - Discussed importance of maintaining an anti-inflammatory diet.  Possible prior mild stroke Reports of listing to the right and facial asymmetry suggest possible mild stroke. Awaiting MRI for diagnosis. - Order MRI of the brain to assess for evidence of prior stroke. - Consider adding antiplatelet therapy if MRI confirms stroke.  Chest pain, resolved, non-cardiac suspected Recent chest pain episode resolved. ER workup normal. Suspected non-cardiac, possibly gastrointestinal. - Recommend that she follow-up with gastroenterologist for further evaluation of GI symptoms. - Discuss potential for stress test if further episodes of chest pain occur.  Rash Rash improving with topical corticosteroid cream. - Continue topical corticosteroid cream as prescribed.  Obesity status post laparoscopic adjustable gastric banding Reports potential issues with the band, prefers to keep it for appetite control. - Encourage follow-up with lap band specialist to assess for potential issues with the band.  Constipation and intermittent functional gastrointestinal symptoms Intermittent constipation and GI symptoms, possibly related to gastric banding. Symptoms include slow bowel movements and occasional bleeding, likely from hemorrhoids. - Encourage follow-up with gastroenterologist for further evaluation. - Continue use of psyllium (Metamucil) as needed for constipation.  Hemorrhoids Reports occasional bleeding, likely from hemorrhoids.  Acute embolism and thrombosis of left femoral vein On Eliquis  for anticoagulation. Discussed potential addition of antiplatelet therapy if MRI confirms stroke. - Continue Eliquis  as prescribed. - Consider antiplatelet therapy if MRI confirms  stroke.  Signed Harlene Schroeder, MD

## 2023-12-07 ENCOUNTER — Emergency Department (HOSPITAL_COMMUNITY)

## 2023-12-07 ENCOUNTER — Ambulatory Visit
Admission: EM | Admit: 2023-12-07 | Discharge: 2023-12-07 | Disposition: A | Discharge status: Home / Self Care | Attending: Family Medicine | Admitting: Family Medicine

## 2023-12-07 ENCOUNTER — Other Ambulatory Visit: Payer: Self-pay

## 2023-12-07 ENCOUNTER — Emergency Department (HOSPITAL_COMMUNITY)
Admission: EM | Admit: 2023-12-07 | Discharge: 2023-12-08 | Disposition: A | Discharge status: Home / Self Care | Attending: Emergency Medicine | Admitting: Emergency Medicine

## 2023-12-07 ENCOUNTER — Encounter (HOSPITAL_COMMUNITY): Payer: Self-pay

## 2023-12-07 DIAGNOSIS — I771 Stricture of artery: Secondary | ICD-10-CM | POA: Diagnosis not present

## 2023-12-07 DIAGNOSIS — J9811 Atelectasis: Secondary | ICD-10-CM | POA: Diagnosis not present

## 2023-12-07 DIAGNOSIS — Z7901 Long term (current) use of anticoagulants: Secondary | ICD-10-CM | POA: Diagnosis not present

## 2023-12-07 DIAGNOSIS — I700 Atherosclerosis of aorta: Secondary | ICD-10-CM | POA: Diagnosis not present

## 2023-12-07 DIAGNOSIS — R0789 Other chest pain: Secondary | ICD-10-CM | POA: Diagnosis not present

## 2023-12-07 DIAGNOSIS — R079 Chest pain, unspecified: Secondary | ICD-10-CM | POA: Diagnosis not present

## 2023-12-07 LAB — CBC WITH DIFFERENTIAL/PLATELET
Abs Immature Granulocytes: 0.01 K/uL (ref 0.00–0.07)
Basophils Absolute: 0.1 K/uL (ref 0.0–0.1)
Basophils Relative: 1 %
Eosinophils Absolute: 0.1 K/uL (ref 0.0–0.5)
Eosinophils Relative: 2 %
HCT: 41.1 % (ref 36.0–46.0)
Hemoglobin: 13.8 g/dL (ref 12.0–15.0)
Immature Granulocytes: 0 %
Lymphocytes Relative: 28 %
Lymphs Abs: 1.7 K/uL (ref 0.7–4.0)
MCH: 31.9 pg (ref 26.0–34.0)
MCHC: 33.6 g/dL (ref 30.0–36.0)
MCV: 95.1 fL (ref 80.0–100.0)
Monocytes Absolute: 0.6 K/uL (ref 0.1–1.0)
Monocytes Relative: 11 %
Neutro Abs: 3.5 K/uL (ref 1.7–7.7)
Neutrophils Relative %: 58 %
Platelets: 283 K/uL (ref 150–400)
RBC: 4.32 MIL/uL (ref 3.87–5.11)
RDW: 12.9 % (ref 11.5–15.5)
WBC: 6 K/uL (ref 4.0–10.5)
nRBC: 0 % (ref 0.0–0.2)

## 2023-12-07 LAB — TROPONIN I (HIGH SENSITIVITY)
Troponin I (High Sensitivity): 2 ng/L (ref <18)
Troponin I (High Sensitivity): <2 ng/L (ref <18)

## 2023-12-07 LAB — COMPREHENSIVE METABOLIC PANEL WITH GFR
ALT: 15 U/L (ref 0–44)
AST: 21 U/L (ref 15–41)
Albumin: 4 g/dL (ref 3.5–5.0)
Alkaline Phosphatase: 50 U/L (ref 38–126)
Anion gap: 11 (ref 5–15)
BUN: 13 mg/dL (ref 8–23)
CO2: 24 mmol/L (ref 22–32)
Calcium: 9.5 mg/dL (ref 8.9–10.3)
Chloride: 106 mmol/L (ref 98–111)
Creatinine, Ser: 0.93 mg/dL (ref 0.44–1.00)
GFR, Estimated: >60 mL/min (ref >60)
Glucose, Bld: 108 mg/dL — ABNORMAL HIGH (ref 70–99)
Potassium: 4 mmol/L (ref 3.5–5.1)
Sodium: 141 mmol/L (ref 135–145)
Total Bilirubin: 0.7 mg/dL (ref 0.0–1.2)
Total Protein: 6.8 g/dL (ref 6.5–8.1)

## 2023-12-07 LAB — D-DIMER, QUANTITATIVE: D-Dimer, Quant: <0.27 ug{FEU}/mL (ref 0.00–0.50)

## 2023-12-07 LAB — LIPASE, BLOOD: Lipase: 30 U/L (ref 11–51)

## 2023-12-07 NOTE — ED Notes (Signed)
 Patient is being discharged from the Urgent Care and sent to the Emergency Department via POV . Per [place], Kathryn Pineda, patient is in need of higher level of care due to CP, r/o heart event. Patient is aware and verbalizes understanding of plan of care.  Vitals:   12/07/23 1723  BP: (!) 144/81  Pulse: 67  Resp: 20  Temp: 97.6 F (36.4 Pineda)  SpO2: 97%

## 2023-12-07 NOTE — ED Provider Notes (Signed)
 [facility]  Note:  This document was prepared using Conservation officer, historic buildings and may include unintentional dictation errors.  MRN: [id number] DOB: 22-Sep-1955  Subjective:   Kathryn Pineda is a 68 y.o. female presenting for 1 day history of left sided medial lower penetrating chest pain, sometimes radiate to the left neck and arm. Has a history of Wilson's disease, dvt, pe and is on Eliquis . No diabetes. No history of MI. No smoking. No drug use. No asthma.   No current facility-administered medications for this encounter.  Current Outpatient Medications:    acetaminophen  (TYLENOL ) 500 MG tablet, Take 500 mg by mouth every 6 (six) hours as needed for mild pain or moderate pain., Disp: , Rfl:    albuterol  (VENTOLIN  HFA) 108 (90 Base) MCG/ACT inhaler, Inhale 2 puffs into the lungs every 6 (six) hours as needed for wheezing or shortness of breath., Disp: 8 g, Rfl: 0   apixaban  (ELIQUIS ) 5 MG TABS tablet, Take 1 tablet (5 mg total) by mouth 2 (two) times daily., Disp: 180 tablet, Rfl: 3   Cholecalciferol (VITAMIN D3) 125 MCG (5000 UT) CAPS, Take 5,000 Units by mouth daily., Disp: , Rfl:    clobetasol ointment (TEMOVATE) 0.05 %, Apply 1 Application topically 2 (two) times daily. For up to 2 weeks., Disp: 30 g, Rfl: 0   Cyanocobalamin  (B-12 PO), Take 1 tablet by mouth daily., Disp: , Rfl:    EPINEPHrine  (EPIPEN  2-PAK) 0.3 mg/0.3 mL IJ SOAJ injection, Inject 0.3 mg into the muscle as needed for anaphylaxis., Disp: 2 each, Rfl: PRN   estradiol  (ESTRACE ) 0.1 MG/GM vaginal cream, Place 0.5 g vaginally 2 (two) times a week. Place 0.5g nightly for two weeks then twice a week after, Disp: 30 g, Rfl: 3   loratadine  (CLARITIN ) 10 MG tablet, Take 10 mg by mouth daily., Disp: , Rfl:    Multiple Vitamin (MULTIVITAMIN) tablet, Take 1 tablet by mouth daily., Disp: , Rfl:    Multiple Vitamins-Minerals (EMERGEN-C IMMUNE) PACK, Take 0.5 packets by mouth daily. As needed, Disp: ,  Rfl:    Vibegron  (GEMTESA ) 75 MG TABS, Take 1 tablet (75 mg total) by mouth daily., Disp: 90 tablet, Rfl: 2   Allergies  Allergen Reactions   Paxlovid  [Nirmatrelvir -Ritonavir ] Anaphylaxis    Severe allergic reaction, throat closed up and caused blisters all around the mouth.    Penicillins Itching and Rash   Adhesive [Tape] Other (See Comments)    Band-Aid causes blisters    Past Medical History:  Diagnosis Date   Anal fissure    Arthritis    Biliary colic 5/96   ERCP w/spincterotomy   Condyloma    Deep vein thrombosis (DVT) of lower extremity (HCC)    Fatigue    loss of sleep   Generalized headaches    GERD (gastroesophageal reflux disease)    Hemorrhoid    Hiatal hernia    Migraines    Obesity    Phlebitis of leg    PONV (postoperative nausea and vomiting)    Primary osteoarthritis of both knees    Stress incontinence    Wilson's disease (HCC)      Past Surgical History:  Procedure Laterality Date   ABDOMINAL HYSTERECTOMY     ANAL FISSURECTOMY  1986   ANTERIOR AND POSTERIOR REPAIR N/A 11/24/2016   Procedure: ANTERIOR (CYSTOCELE) AND POSTERIOR REPAIR (RECTOCELE);  Surgeon: Gaston Hamilton, MD;  Location: [facility];  Service: Urology;  Laterality: N/A;   BUNIONECTOMY Bilateral 2005  CESAREAN SECTION  1990   prolapsed cord   CYSTOSCOPY N/A 11/24/2016   Procedure: CYSTOSCOPY;  Surgeon: Gaston Hamilton, MD;  Location: [facility];  Service: Urology;  Laterality: N/A;   ENDOVENOUS ABLATION SAPHENOUS VEIN W/ LASER  2008   both lower legs   LAPAROSCOPIC CHOLECYSTECTOMY  1997   LAPAROSCOPIC GASTRIC SLEEVE RESECTION WITH HIATAL HERNIA REPAIR  09/16/2010   LAPAROSCOPIC VAGINAL HYSTERECTOMY WITH SALPINGECTOMY Bilateral 11/24/2016   Procedure: LAPAROSCOPIC ASSISTED VAGINAL HYSTERECTOMY WITH SALPINGO-OOPHORECTOMY;  Surgeon: Cleotilde Ronal RAMAN, MD;  Location: [facility];  Service: Gynecology;  Laterality: Bilateral;   LOWER EXTREMITY VENOGRAPHY  08/19/2022   Procedure: LOWER EXTREMITY  VENOGRAPHY;  Surgeon: Lanis Fonda BRAVO, MD;  Location: [facility] INVASIVE CV LAB;  Service: Cardiovascular;;   PERIPHERAL VASCULAR THROMBECTOMY N/A 08/19/2022   Procedure: PERIPHERAL VASCULAR THROMBECTOMY;  Surgeon: Lanis Fonda BRAVO, MD;  Location: [facility] INVASIVE CV LAB;  Service: Cardiovascular;  Laterality: N/A;   PERIPHERAL VASCULAR ULTRASOUND/IVUS  08/19/2022   Procedure: Peripheral Vascular Ultrasound/IVUS;  Surgeon: Lanis Fonda BRAVO, MD;  Location: [facility] INVASIVE CV LAB;  Service: Cardiovascular;;    Family History  Problem Relation Age of Onset   Osteoarthritis Mother    Diabetes Father    Colon polyps Father    Hypertension Brother    Cancer Maternal Grandmother        unaware/ unknown primary source   Cancer Maternal Uncle        leukemia   Bladder Cancer Neg Hx    Uterine cancer Neg Hx     Social History   Tobacco Use   Smoking status: Never   Smokeless tobacco: Never  Vaping Use   Vaping status: Never Used  Substance Use Topics   Alcohol use: Yes    Alcohol/week: 1.0 standard drink of alcohol    Types: 1 Glasses of wine per week    Comment: occ   Drug use: No    ROS   Objective:   Vitals: BP (!) 144/81 (BP Location: Right Arm)   Pulse 67   Temp 97.6 F (36.4 C) (Oral)   Resp 20   SpO2 97%   Physical Exam Constitutional:      General: She is not in acute distress.    Appearance: Normal appearance. She is well-developed. She is not ill-appearing, toxic-appearing or diaphoretic.  HENT:     Head: Normocephalic and atraumatic.     Nose: Nose normal.     Mouth/Throat:     Mouth: Mucous membranes are moist.  Eyes:     General: No scleral icterus.       Right eye: No discharge.        Left eye: No discharge.     Extraocular Movements: Extraocular movements intact.  Cardiovascular:     Rate and Rhythm: Normal rate and regular rhythm.     Heart sounds: Normal heart sounds. No murmur heard.    No friction rub. No gallop.  Pulmonary:     Effort: Pulmonary effort is  normal. No respiratory distress.     Breath sounds: No stridor. No wheezing, rhonchi or rales.  Chest:     Chest wall: No tenderness.  Skin:    General: Skin is warm and dry.  Neurological:     General: No focal deficit present.     Mental Status: She is alert and oriented to person, place, and time.  Psychiatric:        Mood and Affect: Mood normal.  Behavior: Behavior normal.    ED ECG REPORT   Date: 12/07/2023  EKG Time: 5:32 PM  Rate: 60bpm  Rhythm: normal sinus rhythm,  unchanged from previous tracings  Axis: normal  Intervals:none  ST&T Change: T-wave flattening in aVL, V2  Narrative Interpretation: Sinus rhythm at 60 bpm with nonspecific T wave changes above.  Comparable to priors.  Assessment and Plan :   PDMP not reviewed this encounter.  1. Left-sided chest pain    Discussed possibility of an acute cardiopulmonary event.  Patient has concerning symptoms, nonreproducible chest pain or chest wall tenderness.  Her symptoms have not responded to GERD medication and are worsening.  As such, recommended further evaluation to the emergency room for rule out of an acute cardiopulmonary event.  Patient is hemodynamically stable and can present by personal vehicle.   Christopher Savannah, [place] 12/07/23 1754

## 2023-12-07 NOTE — ED Triage Notes (Signed)
 Pt c/o left side CP under left breast started this am-felt better after taking gas x/belching then CP returned-NAD-steady gait

## 2023-12-07 NOTE — Discharge Instructions (Addendum)
 Your symptoms are concerning for possible heart symptoms. I encourage you to present to the emergency room for further heart work up.

## 2023-12-07 NOTE — ED Provider Triage Note (Signed)
 Emergency Medicine Provider Triage Evaluation Note  SHARMEKA PALMISANO , a 68 y.o. female  was evaluated in triage.  Pt complains of left lower chest pain that started earlier today.  She reports this pain is intermittent.  Sometimes it worsens with exertion, and sometimes it worsens with movement.  She denies any associated shortness of breath.  Denies any pleuritic chest pain.  Does not radiate to the back.  She denies any cough, fever, or chills.  Reports she does have a history of DVT and PE, is currently on Eliquis .  She has not missed any doses of Eliquis .  Review of Systems  Positive: As above Negative: As above  Physical Exam  BP (!) 143/64 (BP Location: Right Arm)   Pulse (!) 57   Temp 98.3 F (36.8 C) (Oral)   Resp 16   Ht 5' 8 (1.727 m)   Wt 99.8 kg   SpO2 99%   BMI 33.45 kg/m  Gen:   Awake, no distress   Resp:  Normal effort  MSK:   Moves extremities without difficulty    Medical Decision Making  Medically screening exam initiated at 6:49 PM.  Appropriate orders placed.  MONZERRAT WELLEN was informed that the remainder of the evaluation will be completed by another provider, this initial triage assessment does not replace that evaluation, and the importance of remaining in the ED until their evaluation is complete.     Veta Palma, PA-C 12/07/23 1849

## 2023-12-07 NOTE — ED Triage Notes (Signed)
 Patient sent from UC for chest pain that has been intermittent today. Patient states that it is getting worse throughout the day and she has taken blaoting medicine and it has not worked. She takes a eliquis  for previous DVT, and PE in June.

## 2023-12-08 NOTE — ED Provider Notes (Signed)
 [facility] DEPARTMENT AT [facility] Provider Note   CSN: [id number] Arrival date & time: 12/07/23  1816     Patient presents with: Chest Pain   Kathryn Pineda is a 68 y.o. female.   presents with chest pain that began earlier in the day and has since resolved. The pain was localized to one spot, described as stabbing, and was intermittent, easing off and then returning. The patient experienced nausea and vomiting last night, which she attributes to her lap band, but denies any blood in the vomit. She reports that the pain worsened with activity, such as walking after singing, and was not significantly affected by deep breaths. The patient has a history of pulmonary embolism a year ago and is currently on Eliquis . She also has a history of two falls this year, one in June in [place], resulting in multiple injuries, and another in September, both of which have resolved without significant ongoing issues. The patient denies any recent trauma to the abdomen. She reports occasional right-sided abdominal pain after eating, which she associates with bowel issues, and has noted bright red blood in her stools, likely from hemorrhoids. The patient has a lap band, which she has not adjusted recently. She expresses concern about potential complications related to her lap band and previous surgeries. History was obtained from the patient.   Chest Pain      Prior to Admission medications   Medication Sig Start Date End Date Taking? Authorizing Provider  acetaminophen  (TYLENOL ) 500 MG tablet Take 500 mg by mouth every 6 (six) hours as needed for mild pain or moderate pain.    [provider]  albuterol  (VENTOLIN  HFA) 108 (90 Base) MCG/ACT inhaler Inhale 2 puffs into the lungs every 6 (six) hours as needed for wheezing or shortness of breath. 04/02/23   O'Sullivan, Melissa, NP  apixaban  (ELIQUIS ) 5 MG TABS tablet Take 1 tablet (5 mg total) by mouth 2 (two) times daily. 12/02/23    Tonette Lauraine HERO, PA-C  Cholecalciferol (VITAMIN D3) 125 MCG (5000 UT) CAPS Take 5,000 Units by mouth daily.    [provider]  clobetasol ointment (TEMOVATE) 0.05 % Apply 1 Application topically 2 (two) times daily. For up to 2 weeks. 12/02/23   Tonette Lauraine HERO, PA-C  Cyanocobalamin  (B-12 PO) Take 1 tablet by mouth daily.    [provider]  EPINEPHrine  (EPIPEN  2-PAK) 0.3 mg/0.3 mL IJ SOAJ injection Inject 0.3 mg into the muscle as needed for anaphylaxis. 07/20/22   Copland, Harlene BROCKS, MD  estradiol  (ESTRACE ) 0.1 MG/GM vaginal cream Place 0.5 g vaginally 2 (two) times a week. Place 0.5g nightly for two weeks then twice a week after 02/04/23   Guadlupe Dull T, MD  loratadine  (CLARITIN ) 10 MG tablet Take 10 mg by mouth daily.    [provider]  Multiple Vitamin (MULTIVITAMIN) tablet Take 1 tablet by mouth daily.    [provider]  Multiple Vitamins-Minerals (EMERGEN-C IMMUNE) PACK Take 0.5 packets by mouth daily. As needed    [provider]  Vibegron  (GEMTESA ) 75 MG TABS Take 1 tablet (75 mg total) by mouth daily. 09/23/23   Guadlupe Dull DASEN, MD    Allergies: Paxlovid  [nirmatrelvir -ritonavir ], Penicillins, and Adhesive [tape]    Review of Systems  Cardiovascular:  Positive for chest pain.    Updated Vital Signs BP 120/69 (BP Location: Left Arm)   Pulse (!) 53   Temp 98.1 F (36.7 C)   Resp 16   Ht  5' 8 (1.727 m)   Wt 99.8 kg   SpO2 100%   BMI 33.45 kg/m   Physical Exam Vitals and nursing note reviewed.  Constitutional:      Appearance: She is well-developed.  HENT:     Head: Normocephalic and atraumatic.  Cardiovascular:     Rate and Rhythm: Normal rate and regular rhythm.     Heart sounds: No murmur heard. Pulmonary:     Effort: No respiratory distress.     Breath sounds: No stridor. No decreased breath sounds.  Abdominal:     General: There is no distension.  Musculoskeletal:     Cervical back: Normal range of motion.   Neurological:     Mental Status: She is alert.     (all labs ordered are listed, but only abnormal results are displayed) Labs Reviewed  COMPREHENSIVE METABOLIC PANEL WITH GFR - Abnormal; Notable for the following components:      Result Value   Glucose, Bld 108 (*)    All other components within normal limits  CBC WITH DIFFERENTIAL/PLATELET  LIPASE, BLOOD  D-DIMER, QUANTITATIVE  TROPONIN I (HIGH SENSITIVITY)  TROPONIN I (HIGH SENSITIVITY)    EKG: EKG Interpretation Date/Time:  Tuesday December 07 2023 18:25:18 EDT Ventricular Rate:  65 PR Interval:  158 QRS Duration:  86 QT Interval:  390 QTC Calculation: 405 R Axis:   46  Text Interpretation: Normal sinus rhythm Low voltage QRS Cannot rule out Anterior infarct , age undetermined Abnormal ECG When compared with ECG of 07-Dec-2023 17:24, PREVIOUS ECG IS PRESENT Confirmed by Lorette Mayo [phone]) on 12/07/2023 11:20:38 PM  Radiology: [facility] Chest 2 View Result Date: 12/07/2023 CLINICAL DATA:  chest pain EXAM: CHEST - 2 VIEW COMPARISON:  April 02, 2023 FINDINGS: Subsegmental atelectasis in the retrocardiac left lung base. No focal airspace consolidation, pleural effusion, or pneumothorax. No cardiomegaly.Tortuous aorta with aortic atherosclerosis.No acute fracture or destructive lesion. Multilevel thoracic osteophytosis. Cholecystectomy clips. IMPRESSION: No acute cardiopulmonary abnormality. Electronically Signed   By: Rogelia Myers M.D.   On: 12/07/2023 19:31     Procedures   Medications Ordered in the ED - No data to display                                  Medical Decision Making   The patient presented with chest pain that had resolved prior to evaluation. The patient has a history of a pulmonary embolism and is on Eliquis . Initial evaluation included cardiac and blood clot testing, both of which were reassuring. The patient described the pain as stabbing and localized, with no exacerbation upon deep breathing. The  patient also reported a history of falls, one significant fall in June resulting in multiple injuries, and a less severe fall in September. The patient expressed concern about potential gastrointestinal issues related to a lap band. After discussion, the patient declined further imaging such as a CT scan of the abdomen. The patient was discharged with instructions to follow up with a cardiologist and return if symptoms worsen.  Differential Diagnosis: Differential diagnosis includes but is not limited to: PE, dissection, lap band complications, musculoskeletal pain, gastrointestinal irritation or ulcer, pancreatitis, gallbladder disease, and myocardial ischemia.  Diagnostics Review  Laboratory Interpretation (Interpreted by me): Cardiac and D dimer were reassuring. No leukocytosis or e/o other abnormalities  Care significantly affected by the following chronic Conditions: The patient's history of pulmonary embolism and current use of Eliquis  were  considered in the evaluation of chest pain and potential bleeding risks.  Management  Re-Evaluations/Course of Care: The patient's symptoms resolved, and there was a low suspicion for acute cardiac or thromboembolic events based on test results and clinical evaluation.  Final diagnoses:  Nonspecific chest pain    ED Discharge Orders     None          Johniya Durfee, [place], MD 12/08/23 [phone]

## 2023-12-09 ENCOUNTER — Other Ambulatory Visit (HOSPITAL_COMMUNITY): Payer: Self-pay

## 2023-12-13 ENCOUNTER — Encounter: Payer: Self-pay | Admitting: Family Medicine

## 2023-12-13 ENCOUNTER — Ambulatory Visit (INDEPENDENT_AMBULATORY_CARE_PROVIDER_SITE_OTHER): Admitting: Family Medicine

## 2023-12-13 ENCOUNTER — Other Ambulatory Visit (HOSPITAL_BASED_OUTPATIENT_CLINIC_OR_DEPARTMENT_OTHER): Payer: Self-pay

## 2023-12-13 VITALS — BP 114/64 | HR 59 | Temp 97.6°F | Ht 68.0 in | Wt 221.8 lb

## 2023-12-13 DIAGNOSIS — I82412 Acute embolism and thrombosis of left femoral vein: Secondary | ICD-10-CM | POA: Diagnosis not present

## 2023-12-13 DIAGNOSIS — Z0000 Encounter for general adult medical examination without abnormal findings: Secondary | ICD-10-CM | POA: Diagnosis not present

## 2023-12-13 DIAGNOSIS — W19XXXD Unspecified fall, subsequent encounter: Secondary | ICD-10-CM

## 2023-12-13 MED ORDER — AREXVY 120 MCG/0.5ML IM SUSR
0.5000 mL | Freq: Once | INTRAMUSCULAR | 0 refills | Status: AC
  Filled 2023-12-13: qty 0.5, 1d supply, fill #0

## 2023-12-20 ENCOUNTER — Encounter: Payer: Self-pay | Admitting: Radiology

## 2023-12-29 ENCOUNTER — Telehealth: Payer: Self-pay

## 2023-12-29 NOTE — Telephone Encounter (Signed)
 Copied from CRM [id number]. Topic: Clinical - Medication Prior Auth >> Dec 29, 2023  1:46 PM Frederich PARAS wrote: Reason for CRM: pt needs a prior auth for mri of brain without contrast. Fax # [phone] . Kiara from [facility] [place] [facility]. Callback # [phone]

## 2023-12-29 NOTE — Telephone Encounter (Signed)
 Noted

## 2023-12-29 NOTE — Telephone Encounter (Signed)
 Zegarra, Laila M, NT to Me  (Selected Message)     12/29/23  2:23 PM This has been approved and sent to [facility].

## 2024-01-03 ENCOUNTER — Ambulatory Visit
Admission: RE | Admit: 2024-01-03 | Discharge: 2024-01-03 | Disposition: A | Discharge status: Home / Self Care | Source: Ambulatory Visit | Attending: Family Medicine | Admitting: Family Medicine

## 2024-01-03 DIAGNOSIS — H748X3 Other specified disorders of middle ear and mastoid, bilateral: Secondary | ICD-10-CM | POA: Diagnosis not present

## 2024-01-03 DIAGNOSIS — W19XXXD Unspecified fall, subsequent encounter: Secondary | ICD-10-CM

## 2024-01-09 ENCOUNTER — Encounter: Payer: Self-pay | Admitting: Family Medicine

## 2024-01-09 DIAGNOSIS — H9203 Otalgia, bilateral: Secondary | ICD-10-CM

## 2024-01-10 ENCOUNTER — Other Ambulatory Visit: Payer: Self-pay

## 2024-01-10 ENCOUNTER — Other Ambulatory Visit (HOSPITAL_BASED_OUTPATIENT_CLINIC_OR_DEPARTMENT_OTHER): Payer: Self-pay

## 2024-01-10 MED ORDER — CLINDAMYCIN HCL 300 MG PO CAPS
ORAL_CAPSULE | ORAL | 0 refills | Status: AC
  Filled 2024-01-10: qty 21, 7d supply, fill #0

## 2024-02-16 ENCOUNTER — Other Ambulatory Visit (HOSPITAL_BASED_OUTPATIENT_CLINIC_OR_DEPARTMENT_OTHER): Payer: Self-pay

## 2024-02-16 ENCOUNTER — Other Ambulatory Visit: Payer: Self-pay | Admitting: Family

## 2024-02-17 MED ORDER — ALBUTEROL SULFATE HFA 108 (90 BASE) MCG/ACT IN AERS
2.0000 | INHALATION_SPRAY | Freq: Four times a day (QID) | RESPIRATORY_TRACT | 0 refills | Status: AC | PRN
  Filled 2024-02-17: qty 6.7, 25d supply, fill #0

## 2024-02-18 ENCOUNTER — Other Ambulatory Visit (HOSPITAL_BASED_OUTPATIENT_CLINIC_OR_DEPARTMENT_OTHER): Payer: Self-pay

## 2024-02-28 ENCOUNTER — Encounter: Payer: Self-pay | Admitting: *Deleted

## 2024-07-24 ENCOUNTER — Inpatient Hospital Stay: Admitting: Medical Oncology

## 2024-07-24 ENCOUNTER — Inpatient Hospital Stay

## 2024-08-28 ENCOUNTER — Ambulatory Visit (HOSPITAL_BASED_OUTPATIENT_CLINIC_OR_DEPARTMENT_OTHER): Admitting: Obstetrics & Gynecology
